# Patient Record
Sex: Female | Born: 1980 | Race: Black or African American | Hispanic: No | Marital: Single | Smoking: Never smoker
Health system: Southern US, Community
[De-identification: ages and names within clinical notes are randomized; demographics above are authoritative.]

## PROBLEM LIST (undated history)

## (undated) DIAGNOSIS — B009 Herpesviral infection, unspecified: Secondary | ICD-10-CM

## (undated) DIAGNOSIS — F79 Unspecified intellectual disabilities: Secondary | ICD-10-CM

## (undated) DIAGNOSIS — F329 Major depressive disorder, single episode, unspecified: Secondary | ICD-10-CM

## (undated) DIAGNOSIS — F209 Schizophrenia, unspecified: Secondary | ICD-10-CM

## (undated) DIAGNOSIS — F419 Anxiety disorder, unspecified: Secondary | ICD-10-CM

## (undated) DIAGNOSIS — F32A Depression, unspecified: Secondary | ICD-10-CM

## (undated) HISTORY — DX: Herpesviral infection, unspecified: B00.9

---

## 2011-08-13 ENCOUNTER — Emergency Department (HOSPITAL_COMMUNITY)
Admission: EM | Admit: 2011-08-13 | Discharge: 2011-08-14 | Disposition: A | Discharge status: Home / Self Care | Payer: Medicaid Other | Attending: Emergency Medicine | Admitting: Emergency Medicine

## 2011-08-13 DIAGNOSIS — F209 Schizophrenia, unspecified: Secondary | ICD-10-CM | POA: Insufficient documentation

## 2011-08-13 DIAGNOSIS — R45851 Suicidal ideations: Secondary | ICD-10-CM | POA: Insufficient documentation

## 2011-08-13 DIAGNOSIS — F79 Unspecified intellectual disabilities: Secondary | ICD-10-CM | POA: Insufficient documentation

## 2011-08-13 LAB — RAPID URINE DRUG SCREEN, HOSP PERFORMED
Amphetamines: NOT DETECTED
Tetrahydrocannabinol: NOT DETECTED

## 2011-08-13 LAB — CBC
Hemoglobin: 11.8 g/dL — ABNORMAL LOW (ref 12.0–15.0)
RBC: 4.63 MIL/uL (ref 3.87–5.11)
WBC: 6.5 10*3/uL (ref 4.0–10.5)

## 2011-08-13 LAB — COMPREHENSIVE METABOLIC PANEL
ALT: 20 U/L (ref 0–35)
Alkaline Phosphatase: 80 U/L (ref 39–117)
CO2: 23 mEq/L (ref 19–32)
Chloride: 109 mEq/L (ref 96–112)
GFR calc Af Amer: >60 mL/min (ref >60)
GFR calc non Af Amer: >60 mL/min (ref >60)
Glucose, Bld: 84 mg/dL (ref 70–99)
Potassium: 3.5 mEq/L (ref 3.5–5.1)
Sodium: 143 mEq/L (ref 135–145)
Total Bilirubin: 0.2 mg/dL — ABNORMAL LOW (ref 0.3–1.2)
Total Protein: 8.2 g/dL (ref 6.0–8.3)

## 2011-08-13 LAB — DIFFERENTIAL
Basophils Absolute: 0 10*3/uL (ref 0.0–0.1)
Basophils Relative: 0 % (ref 0–1)
Lymphocytes Relative: 46 % (ref 12–46)
Neutro Abs: 3 10*3/uL (ref 1.7–7.7)
Neutrophils Relative %: 46 % (ref 43–77)

## 2011-08-14 DIAGNOSIS — F259 Schizoaffective disorder, unspecified: Secondary | ICD-10-CM

## 2011-08-15 NOTE — Consult Note (Signed)
  NAMESHUNTE, SENSENEY NO.:  [id number]  MEDICAL RECORD NO.:  [id number]  LOCATION:  [facility]LED                         FACILITY:  [facility]  PHYSICIAN:  Eulogio Ditch, MD DATE OF BIRTH:  30-Aug-1981  DATE OF CONSULTATION:  08/14/2011 DATE OF DISCHARGE:                                CONSULTATION   FACILITY:  [facility].  HISTORY OF PRESENT ILLNESS:  I saw the patient and reviewed the comprehensive assessment.  The patient is a 30 year old [place] American female with history of schizoaffective disorder and mild MR.  The patient lives at group home.  The patient stated that she was upset with the peer at the group home, that is why she scratched herself.  The patient do not have any laceration or bruise on the forearm.  The patient is very logical and goal directed, easily retractable, not hallucinating or delusional, not suicidal or homicidal.  The patient wants to go back to the group home.  The patient is compliant with her medication.  No agitation reported by the ER staff.  PSYCH MEDICATIONS:  The patient is on: 1. Topiramate 25 mg p.o. daily. 2. Depakote 500 mg at bedtime. 3. Zyprexa 15 mg at bedtime.  MEDICAL ISSUES:  No active medical issues.  ALLERGIES:  No known drug allergies  MENTAL STATUS EXAM:  The patient is calm, cooperative to the interview. Fair eye contact.  No abnormal movements noticed.  Speech is normal in rate, rhythm and volume.  Mood euthymic.  Affect mood congruent. Thought process logical and goal directed.  Not hallucinating or delusional, not suicidal or homicidal.  Alert, awake, oriented x3. Memory:  Immediate, recent, remote fair.  Attention and concentration fair.  Abstraction ability fair.  Insight and judgment intact.  DIAGNOSES:  AXIS I:  As per history, schizoaffective disorder. AXIS II:  Mild mental retardation. AXIS III:  No active medical issue. AXIS IV:  Chronic mental health issues. AXIS V:   55.  RECOMMENDATIONS:  At this time, the patient can be discharged back to the group home.  She is not acutely psychotic and not having any suicidal or homicidal ideations.  Thanks for involving me in taking care of this patient.     Eulogio Ditch, MD     SA/MEDQ  D:  08/14/2011  T:  08/14/2011  Job:  [id number]  Electronically Signed by Eulogio Ditch  on 08/15/2011 01:06:48 PM

## 2011-11-01 ENCOUNTER — Encounter: Payer: Self-pay | Admitting: *Deleted

## 2011-11-01 ENCOUNTER — Emergency Department (HOSPITAL_COMMUNITY)
Admission: EM | Admit: 2011-11-01 | Discharge: 2011-11-02 | Disposition: A | Discharge status: Home / Self Care | Payer: Medicaid Other | Attending: Emergency Medicine | Admitting: Emergency Medicine

## 2011-11-01 DIAGNOSIS — F3289 Other specified depressive episodes: Secondary | ICD-10-CM | POA: Insufficient documentation

## 2011-11-01 DIAGNOSIS — F329 Major depressive disorder, single episode, unspecified: Secondary | ICD-10-CM | POA: Insufficient documentation

## 2011-11-01 DIAGNOSIS — Z8659 Personal history of other mental and behavioral disorders: Secondary | ICD-10-CM | POA: Insufficient documentation

## 2011-11-01 DIAGNOSIS — Z79899 Other long term (current) drug therapy: Secondary | ICD-10-CM | POA: Insufficient documentation

## 2011-11-01 HISTORY — DX: Depression, unspecified: F32.A

## 2011-11-01 HISTORY — DX: Major depressive disorder, single episode, unspecified: F32.9

## 2011-11-01 HISTORY — DX: Schizophrenia, unspecified: F20.9

## 2011-11-01 LAB — CBC
Hemoglobin: 12.2 g/dL (ref 12.0–15.0)
MCH: 24.8 pg — ABNORMAL LOW (ref 26.0–34.0)
MCHC: 33.5 g/dL (ref 30.0–36.0)

## 2011-11-01 LAB — URINALYSIS, ROUTINE W REFLEX MICROSCOPIC
Bilirubin Urine: NEGATIVE
Glucose, UA: NEGATIVE mg/dL
Hgb urine dipstick: NEGATIVE
Ketones, ur: NEGATIVE mg/dL
pH: 7 (ref 5.0–8.0)

## 2011-11-01 LAB — COMPREHENSIVE METABOLIC PANEL
ALT: 11 U/L (ref 0–35)
Calcium: 9.7 mg/dL (ref 8.4–10.5)
GFR calc Af Amer: >90 mL/min (ref >90)
Glucose, Bld: 91 mg/dL (ref 70–99)
Sodium: 138 mEq/L (ref 135–145)
Total Protein: 8.4 g/dL — ABNORMAL HIGH (ref 6.0–8.3)

## 2011-11-01 LAB — RAPID URINE DRUG SCREEN, HOSP PERFORMED
Benzodiazepines: NOT DETECTED
Cocaine: NOT DETECTED

## 2011-11-01 LAB — ETHANOL: Alcohol, Ethyl (B): <11 mg/dL (ref 0–11)

## 2011-11-01 MED ORDER — CLONAZEPAM 1 MG PO TABS
1.0000 mg | ORAL_TABLET | Freq: Every day | ORAL | Status: DC
  Administered 2011-11-01: 1 mg via ORAL
  Filled 2011-11-01: qty 1

## 2011-11-01 MED ORDER — TOPIRAMATE 25 MG PO TABS
100.0000 mg | ORAL_TABLET | Freq: Every day | ORAL | Status: DC
  Administered 2011-11-01 – 2011-11-02 (×2): 100 mg via ORAL
  Filled 2011-11-01 (×2): qty 4

## 2011-11-01 MED ORDER — DIVALPROEX SODIUM 500 MG PO DR TAB
500.0000 mg | DELAYED_RELEASE_TABLET | Freq: Once | ORAL | Status: AC
  Administered 2011-11-01: 500 mg via ORAL
  Filled 2011-11-01: qty 1

## 2011-11-01 MED ORDER — ACETAMINOPHEN 325 MG PO TABS: 650.0000 mg | ORAL_TABLET | ORAL | Status: DC | PRN

## 2011-11-01 MED ORDER — OLANZAPINE 5 MG PO TABS
30.0000 mg | ORAL_TABLET | Freq: Every day | ORAL | Status: DC
  Administered 2011-11-01: 30 mg via ORAL
  Filled 2011-11-01: qty 6

## 2011-11-01 MED ORDER — ONDANSETRON HCL 4 MG PO TABS: 4.0000 mg | ORAL_TABLET | Freq: Three times a day (TID) | ORAL | Status: DC | PRN

## 2011-11-01 MED ORDER — LORAZEPAM 1 MG PO TABS: 1.0000 mg | ORAL_TABLET | Freq: Three times a day (TID) | ORAL | Status: DC | PRN

## 2011-11-01 NOTE — ED Provider Notes (Signed)
History     CSN: [id number] Arrival date & time: 11/01/2011  7:52 PM   First MD Initiated Contact with Patient 11/01/11 2027      Chief Complaint  Patient presents with  . Depression  . Medical Clearance    (Consider location/radiation/quality/duration/timing/severity/associated sxs/prior treatment) The history is provided by the patient and a parent. No language interpreter was used.   Here for medical clearance and BH assessment.  States that she is depressed but not suicidal or homocidal.  Feels hopeless and feels neglected by her family.  States that she is living in a womens group home and they are "being mean" to her also.  Has a 30 year old that she misses.  She has been "cutting" herself with a sharp pencil.  Told the nurse she was using her fingernails.  No visible "cut" marks.  Superficial scratches.    Past Medical History  Diagnosis Date  . Schizophrenia     No past surgical history on file.  No family history on file.  History  Substance Use Topics  . Smoking status: Never Smoker   . Smokeless tobacco: Not on file  . Alcohol Use: No    OB History    Grav Para Term Preterm Abortions TAB SAB Ect Mult Living                  Review of Systems  All other systems reviewed and are negative.    Allergies  Review of patient's allergies indicates no known allergies.  Home Medications   Current Outpatient Rx  Name Route Sig Dispense Refill  . CLONAZEPAM 1 MG PO TABS Oral Take 1 mg by mouth at bedtime.      Marland Kitchen DIVALPROEX SODIUM 500 MG PO TB24 Oral Take 500 mg by mouth daily.      Marland Kitchen FERROUS SULFATE 325 (65 FE) MG PO TABS Oral Take 325 mg by mouth daily.      . THEREMS PO TABS Oral Take 1 tablet by mouth daily.      Marland Kitchen OLANZAPINE 15 MG PO TABS Oral Take 30 mg by mouth at bedtime.      Marland Kitchen OLANZAPINE 5 MG PO TABS Oral Take 5 mg by mouth as needed. For agitation.     Marland Kitchen POLYETHYLENE GLYCOL 3350 PO PACK Oral Take 17 g by mouth daily as needed. For constipation.    Bernadette Hoit SODIUM 8.6-50 MG PO TABS Oral Take 2 tablets by mouth daily.      . TOPIRAMATE 100 MG PO TABS Oral Take 100 mg by mouth at bedtime.        BP 117/79  Pulse 72  Temp(Src) 98.6 F (37 C) (Oral)  Resp 16  SpO2 100%  Physical Exam  Constitutional: She is oriented to person, place, and time. She appears well-developed and well-nourished. No distress.  HENT:  Head: Normocephalic.  Eyes: Pupils are equal, round, and reactive to light.  Neck: Normal range of motion. Neck supple.  Cardiovascular: Normal rate.   Pulmonary/Chest: Effort normal and breath sounds normal.  Abdominal: Soft.  Musculoskeletal: Normal range of motion.  Neurological: She is alert and oriented to person, place, and time.  Skin: Skin is warm and dry. She is not diaphoretic.  Psychiatric: She has a normal mood and affect.    ED Course  Procedures (including critical care time)  Labs Reviewed  CBC - Abnormal; Notable for the following:    MCV 74.1 (*)    MCH 24.8 (*)  All other components within normal limits  COMPREHENSIVE METABOLIC PANEL - Abnormal; Notable for the following:    BUN 5 (*)    Total Protein 8.4 (*)    Total Bilirubin 0.1 (*)    All other components within normal limits  ETHANOL  URINE RAPID DRUG SCREEN (HOSP PERFORMED)  URINALYSIS, ROUTINE W REFLEX MICROSCOPIC  POCT PREGNANCY, URINE  POCT PREGNANCY, URINE   No results found. Medical screening examination/treatment/procedure(s) were performed by non-physician practitioner and as supervising physician I was immediately available for consultation/collaboration. Osvaldo Human, M.D.    No diagnosis found.    MDM  For medical clearance and BH assessment tonight for depression.  States she is not suicidal or homocidal.  Just feels hopeless.  In a group home with women who are being mean to her.  Family is neglecting her.  Misses her 15 year old daughter.  Taking her meds as ordered.  Going to a "day care"  On a daily  basis.  Was here 1 month ago.  States she is "cutting" herself.  Superficial scratch marks on L forearm.         Jethro Bastos, NP 11/01/11 2128 1am  Report given to Dr. Hyacinth Meeker.  Patient here for depression cutting self.  Will see Dr. Debby Freiberg in the am early.  Probably needs a social work consult for a different housing placement.    Jethro Bastos, NP 11/02/11 0114  Jethro Bastos, NP 11/02/11 0149  Carleene Cooper III, MD 11/02/11 [phone]

## 2011-11-01 NOTE — ED Notes (Signed)
Per EMS pt is coming from group home, is depressed, has not had meds today, takes them at night. Pt states wants to hurt herself by scratching her R arm with her L fingernails.  No marks noted.
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
## 2011-11-02 ENCOUNTER — Encounter (HOSPITAL_COMMUNITY): Payer: Self-pay | Admitting: *Deleted

## 2011-11-02 DIAGNOSIS — F329 Major depressive disorder, single episode, unspecified: Secondary | ICD-10-CM
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
## 2011-11-02 NOTE — ED Notes (Signed)
Patient up at desk asking for something to eat. Provided patient with pop tarts and soda.
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
## 2011-11-02 NOTE — Progress Notes (Addendum)
Patient was seen by psychiatrist who has recommended discharge. This writer called group home [phone] and left a message requesting return phone call to arrange for discharge pickup.  Ileene Hutchinson , MSW, LCSWA 11/02/2011 10:06 AM  This writer contacted patient's group home and they advised they are unable to pick her up until 6pm. CSW contacted supervisor who has approved cab voucher. Patient to be transported to group home via [facility] cab. EDP and patient's nurse notified.  Ileene Hutchinson , MSW, LCSWA 11/02/2011 2:21 PM
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
## 2012-01-22 ENCOUNTER — Encounter (HOSPITAL_COMMUNITY): Payer: Self-pay

## 2012-01-22 ENCOUNTER — Emergency Department (HOSPITAL_COMMUNITY)
Admission: EM | Admit: 2012-01-22 | Discharge: 2012-01-23 | Disposition: A | Discharge status: Home / Self Care | Payer: Medicaid Other | Attending: Emergency Medicine | Admitting: Emergency Medicine

## 2012-01-22 DIAGNOSIS — Z79899 Other long term (current) drug therapy: Secondary | ICD-10-CM | POA: Insufficient documentation

## 2012-01-22 DIAGNOSIS — F209 Schizophrenia, unspecified: Secondary | ICD-10-CM

## 2012-01-22 DIAGNOSIS — F411 Generalized anxiety disorder: Secondary | ICD-10-CM | POA: Insufficient documentation

## 2012-01-22 DIAGNOSIS — F419 Anxiety disorder, unspecified: Secondary | ICD-10-CM

## 2012-01-22 LAB — URINALYSIS, ROUTINE W REFLEX MICROSCOPIC
Ketones, ur: NEGATIVE mg/dL
Leukocytes, UA: NEGATIVE
Nitrite: NEGATIVE
Protein, ur: NEGATIVE mg/dL
Urobilinogen, UA: 0.2 mg/dL (ref 0.0–1.0)

## 2012-01-22 LAB — CBC
MCHC: 33.9 g/dL (ref 30.0–36.0)
Platelets: 206 10*3/uL (ref 150–400)
RDW: 15.3 % (ref 11.5–15.5)
WBC: 8.1 10*3/uL (ref 4.0–10.5)

## 2012-01-22 LAB — RAPID URINE DRUG SCREEN, HOSP PERFORMED: Amphetamines: NOT DETECTED

## 2012-01-22 LAB — BASIC METABOLIC PANEL
CO2: 22 mEq/L (ref 19–32)
Chloride: 107 mEq/L (ref 96–112)
GFR calc non Af Amer: >90 mL/min (ref >90)
Glucose, Bld: 78 mg/dL (ref 70–99)
Potassium: 3.4 mEq/L — ABNORMAL LOW (ref 3.5–5.1)
Sodium: 140 mEq/L (ref 135–145)

## 2012-01-22 LAB — ETHANOL: Alcohol, Ethyl (B): <11 mg/dL (ref 0–11)

## 2012-01-22 MED ORDER — CLONAZEPAM 1 MG PO TABS: 1.0000 mg | ORAL_TABLET | Freq: Every day | ORAL | Status: DC

## 2012-01-22 MED ORDER — OLANZAPINE 5 MG PO TABS: 30.0000 mg | ORAL_TABLET | Freq: Every day | ORAL | Status: DC

## 2012-01-22 MED ORDER — IBUPROFEN 600 MG PO TABS: 600.0000 mg | ORAL_TABLET | Freq: Three times a day (TID) | ORAL | Status: DC | PRN

## 2012-01-22 MED ORDER — ZIPRASIDONE HCL 20 MG PO CAPS: 160.0000 mg | ORAL_CAPSULE | Freq: Every day | ORAL | Status: DC

## 2012-01-22 MED ORDER — FERROUS SULFATE 325 (65 FE) MG PO TABS
325.0000 mg | ORAL_TABLET | Freq: Every day | ORAL | Status: DC
  Administered 2012-01-23: 325 mg via ORAL
  Filled 2012-01-22: qty 1

## 2012-01-22 MED ORDER — ALUM & MAG HYDROXIDE-SIMETH 200-200-20 MG/5ML PO SUSP: 30.0000 mL | ORAL | Status: DC | PRN

## 2012-01-22 MED ORDER — TOPIRAMATE 25 MG PO TABS: 100.0000 mg | ORAL_TABLET | Freq: Every day | ORAL | Status: DC

## 2012-01-22 MED ORDER — ONDANSETRON HCL 4 MG PO TABS: 4.0000 mg | ORAL_TABLET | Freq: Three times a day (TID) | ORAL | Status: DC | PRN

## 2012-01-22 MED ORDER — LORAZEPAM 1 MG PO TABS: 1.0000 mg | ORAL_TABLET | Freq: Three times a day (TID) | ORAL | Status: DC | PRN

## 2012-01-22 MED ORDER — NICOTINE 21 MG/24HR TD PT24: 21.0000 mg | MEDICATED_PATCH | Freq: Every day | TRANSDERMAL | Status: DC

## 2012-01-22 MED ORDER — ADULT MULTIVITAMIN W/MINERALS CH: 1.0000 | ORAL_TABLET | Freq: Every day | ORAL | Status: DC

## 2012-01-22 MED ORDER — ACETAMINOPHEN 325 MG PO TABS: 650.0000 mg | ORAL_TABLET | ORAL | Status: DC | PRN

## 2012-01-22 MED ORDER — DIVALPROEX SODIUM ER 500 MG PO TB24: 500.0000 mg | ORAL_TABLET | Freq: Every day | ORAL | Status: DC

## 2012-01-22 MED ORDER — SENNOSIDES-DOCUSATE SODIUM 8.6-50 MG PO TABS: 2.0000 | ORAL_TABLET | Freq: Every day | ORAL | Status: DC

## 2012-01-22 MED ORDER — ZOLPIDEM TARTRATE 5 MG PO TABS: 5.0000 mg | ORAL_TABLET | Freq: Every evening | ORAL | Status: DC | PRN

## 2012-01-22 NOTE — ED Notes (Signed)
Telepsych report faxed to MD.  Awaiting consult.

## 2012-01-22 NOTE — ED Notes (Signed)
Patient arrived by ems after having panic attack and increased anxiety at halfway house this afternoon. Patient denies suicidal thoughts but reports that she just cannot get her mind right.
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
## 2012-01-23 MED ORDER — TOPIRAMATE 25 MG PO TABS
100.0000 mg | ORAL_TABLET | Freq: Every day | ORAL | Status: DC
  Administered 2012-01-23: 100 mg via ORAL
  Filled 2012-01-23: qty 4

## 2012-01-23 MED ORDER — OLANZAPINE 5 MG PO TABS
30.0000 mg | ORAL_TABLET | Freq: Every day | ORAL | Status: DC
  Administered 2012-01-23: 30 mg via ORAL
  Filled 2012-01-23: qty 6

## 2012-01-23 MED ORDER — ZIPRASIDONE HCL 20 MG PO CAPS
160.0000 mg | ORAL_CAPSULE | Freq: Every day | ORAL | Status: DC
  Administered 2012-01-23: 160 mg via ORAL
  Filled 2012-01-23: qty 8

## 2012-01-23 MED ORDER — CLONAZEPAM 1 MG PO TABS
1.0000 mg | ORAL_TABLET | Freq: Every day | ORAL | Status: DC
  Administered 2012-01-23: 1 mg via ORAL
  Filled 2012-01-23: qty 1

## 2012-01-23 MED ORDER — DIVALPROEX SODIUM ER 500 MG PO TB24
500.0000 mg | ORAL_TABLET | Freq: Every day | ORAL | Status: DC
  Administered 2012-01-23: 500 mg via ORAL
  Filled 2012-01-23 (×3): qty 1

## 2012-01-23 NOTE — ED Notes (Signed)
Pt not able to d/c to group home tonight.  Pt needs transport.  Arlene at group home states that she has other residents and unable to leave to get patient.  Pt with no other family in area.  D/T pt having MR, decided with Dr. Preston Fleeting, keep patient overnight, give regular meds and d/c in am.  Will hold pt till am.

## 2012-08-09 ENCOUNTER — Other Ambulatory Visit: Payer: Self-pay | Admitting: Obstetrics and Gynecology

## 2012-08-09 ENCOUNTER — Other Ambulatory Visit (HOSPITAL_COMMUNITY)
Admission: RE | Admit: 2012-08-09 | Discharge: 2012-08-09 | Disposition: A | Discharge status: Home / Self Care | Payer: Medicaid Other | Source: Ambulatory Visit | Attending: Obstetrics and Gynecology | Admitting: Obstetrics and Gynecology

## 2012-08-09 DIAGNOSIS — Z1151 Encounter for screening for human papillomavirus (HPV): Secondary | ICD-10-CM | POA: Insufficient documentation

## 2012-08-09 DIAGNOSIS — Z124 Encounter for screening for malignant neoplasm of cervix: Secondary | ICD-10-CM | POA: Insufficient documentation

## 2012-08-09 DIAGNOSIS — Z113 Encounter for screening for infections with a predominantly sexual mode of transmission: Secondary | ICD-10-CM | POA: Insufficient documentation

## 2012-08-11 ENCOUNTER — Encounter (HOSPITAL_COMMUNITY): Payer: Self-pay | Admitting: Emergency Medicine

## 2012-08-11 ENCOUNTER — Emergency Department (HOSPITAL_COMMUNITY)
Admission: EM | Admit: 2012-08-11 | Discharge: 2012-08-12 | Disposition: A | Discharge status: Home / Self Care | Payer: Medicaid Other | Attending: Emergency Medicine | Admitting: Emergency Medicine

## 2012-08-11 DIAGNOSIS — F79 Unspecified intellectual disabilities: Secondary | ICD-10-CM | POA: Insufficient documentation

## 2012-08-11 DIAGNOSIS — Z79899 Other long term (current) drug therapy: Secondary | ICD-10-CM | POA: Insufficient documentation

## 2012-08-11 DIAGNOSIS — Z8659 Personal history of other mental and behavioral disorders: Secondary | ICD-10-CM | POA: Insufficient documentation

## 2012-08-11 DIAGNOSIS — R079 Chest pain, unspecified: Secondary | ICD-10-CM

## 2012-08-11 HISTORY — DX: Unspecified intellectual disabilities: F79

## 2012-08-11 MED ORDER — LORAZEPAM 1 MG PO TABS
1.0000 mg | ORAL_TABLET | Freq: Once | ORAL | Status: AC
  Administered 2012-08-12: 1 mg via ORAL
  Filled 2012-08-11: qty 1

## 2012-08-11 MED ORDER — IBUPROFEN 800 MG PO TABS
800.0000 mg | ORAL_TABLET | Freq: Once | ORAL | Status: AC
  Administered 2012-08-12: 800 mg via ORAL
  Filled 2012-08-11: qty 1
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
## 2012-08-11 NOTE — ED Notes (Signed)
Patient placed on cardiac monitor.

## 2012-08-11 NOTE — ED Provider Notes (Signed)
History     CSN: [id number]  Arrival date & time 08/11/12  2217   First MD Initiated Contact with Patient 08/11/12 2341      Chief Complaint  Patient presents with  . Chest Pain    (Consider location/radiation/quality/duration/timing/severity/associated sxs/prior treatment) HPI HX per PT. Stabbing intermittent CP, on and off since middle of last month.  When it gets bad, she gets associated tingling in both of her hands. No SOB. No N/V/D. No Diaphoresis. No syncope. Pain gest worse when "I get paranoid".  Is currently taking all medications as prescribed. No cough. No F/C. Lives in a group home. Tonight. Feels stressed out about a court date coming up. Pain returning, on and off but not present now.  Past Medical History  Diagnosis Date  . Schizophrenia   . Depression   . Mental retardation     History reviewed. No pertinent past surgical history.  No family history on file.  History  Substance Use Topics  . Smoking status: Never Smoker   . Smokeless tobacco: Not on file  . Alcohol Use: No    OB History    Grav Para Term Preterm Abortions TAB SAB Ect Mult Living                  Review of Systems  Constitutional: Negative for fever and chills.  HENT: Negative for neck pain and neck stiffness.   Eyes: Negative for pain.  Respiratory: Negative for shortness of breath.   Cardiovascular: Positive for chest pain.  Gastrointestinal: Negative for abdominal pain.  Genitourinary: Negative for dysuria.  Musculoskeletal: Negative for back pain.  Skin: Negative for rash.  Neurological: Negative for headaches.  All other systems reviewed and are negative.    Allergies  Review of patient's allergies indicates no known allergies.  Home Medications   Current Outpatient Rx  Name Route Sig Dispense Refill  . ACETAMINOPHEN 500 MG PO TABS Oral Take 500 mg by mouth every 6 (six) hours as needed. For tooth pain relief    . CLONAZEPAM 1 MG PO TABS Oral Take 1 mg by mouth at  bedtime.      Marland Kitchen DIVALPROEX SODIUM ER 500 MG PO TB24 Oral Take 500 mg by mouth daily.      Marland Kitchen FERROUS SULFATE 325 (65 FE) MG PO TABS Oral Take 325 mg by mouth daily.      . ADULT MULTIVITAMIN W/MINERALS CH Oral Take 1 tablet by mouth daily.    Bernadette Hoit SODIUM 8.6-50 MG PO TABS Oral Take 2 tablets by mouth daily.      . TOPIRAMATE 100 MG PO TABS Oral Take 100 mg by mouth at bedtime.        BP 97/68  Pulse 62  Temp 98.2 F (36.8 C) (Oral)  Resp 17  SpO2 100%  Physical Exam  Constitutional: She is oriented to person, place, and time. She appears well-developed and well-nourished.  HENT:  Head: Normocephalic and atraumatic.  Eyes: Conjunctivae and EOM are normal. Pupils are equal, round, and reactive to light.  Neck: Trachea normal. Neck supple. No thyromegaly present.  Cardiovascular: Normal rate, regular rhythm, S1 normal, S2 normal and normal pulses.     No systolic murmur is present   No diastolic murmur is present  Pulses:      Radial pulses are 2+ on the right side, and 2+ on the left side.  Pulmonary/Chest: Effort normal and breath sounds normal. She has no wheezes. She has no rhonchi. She  has no rales. She exhibits no tenderness.  Abdominal: Soft. Normal appearance and bowel sounds are normal. There is no tenderness. There is no CVA tenderness and negative Murphy's sign.  Musculoskeletal:       BLE:s Calves nontender, no cords or erythema, negative Homans sign  Neurological: She is alert and oriented to person, place, and time. She has normal strength. No cranial nerve deficit or sensory deficit. GCS eye subscore is 4. GCS verbal subscore is 5. GCS motor subscore is 6.  Skin: Skin is warm and dry. No rash noted. She is not diaphoretic.  Psychiatric: Her speech is normal.       Anxious, otherwise Cooperative and appropriate    ED Course  Procedures (including critical care time)  Results for orders placed during the hospital encounter of 01/22/12  CBC       Component Value Range   WBC 8.1  4.0 - 10.5 K/uL   RBC 4.84  3.87 - 5.11 MIL/uL   Hemoglobin 12.0  12.0 - 15.0 g/dL   HCT 40.9 (*) 81.1 - 91.4 %   MCV 73.1 (*) 78.0 - 100.0 fL   MCH 24.8 (*) 26.0 - 34.0 pg   MCHC 33.9  30.0 - 36.0 g/dL   RDW 78.2  95.6 - 21.3 %   Platelets 206  150 - 400 K/uL  ETHANOL      Component Value Range   Alcohol, Ethyl (B) <11  0 - 11 mg/dL  URINALYSIS, ROUTINE W REFLEX MICROSCOPIC      Component Value Range   Color, Urine YELLOW  YELLOW   APPearance CLEAR  CLEAR   Specific Gravity, Urine 1.004 (*) 1.005 - 1.030   pH 7.0  5.0 - 8.0   Glucose, UA NEGATIVE  NEGATIVE mg/dL   Hgb urine dipstick NEGATIVE  NEGATIVE   Bilirubin Urine NEGATIVE  NEGATIVE   Ketones, ur NEGATIVE  NEGATIVE mg/dL   Protein, ur NEGATIVE  NEGATIVE mg/dL   Urobilinogen, UA 0.2  0.0 - 1.0 mg/dL   Nitrite NEGATIVE  NEGATIVE   Leukocytes, UA NEGATIVE  NEGATIVE  URINE RAPID DRUG SCREEN (HOSP PERFORMED)      Component Value Range   Opiates NONE DETECTED  NONE DETECTED   Cocaine NONE DETECTED  NONE DETECTED   Benzodiazepines NONE DETECTED  NONE DETECTED   Amphetamines NONE DETECTED  NONE DETECTED   Tetrahydrocannabinol NONE DETECTED  NONE DETECTED   Barbiturates NONE DETECTED  NONE DETECTED  BASIC METABOLIC PANEL      Component Value Range   Sodium 140  135 - 145 mEq/L   Potassium 3.4 (*) 3.5 - 5.1 mEq/L   Chloride 107  96 - 112 mEq/L   CO2 22  19 - 32 mEq/L   Glucose, Bld 78  70 - 99 mg/dL   BUN 3 (*) 6 - 23 mg/dL   Creatinine, Ser 0.86  0.50 - 1.10 mg/dL   Calcium 9.7  8.4 - 57.8 mg/dL   GFR calc non Af Amer >90  >90 mL/min   GFR calc Af Amer >90  >90 mL/min  POCT PREGNANCY, URINE      Component Value Range   Preg Test, Ur NEGATIVE  NEGATIVE   Dg Chest 2 View  08/12/2012  *RADIOLOGY REPORT*  Clinical Data: Chest pain.  CHEST - 2 VIEW  Comparison:  None.  Findings:  The heart size and mediastinal contours are within normal limits.  Both lungs are clear.  The visualized skeletal  structures are unremarkable.  IMPRESSION: No active cardiopulmonary disease.   Original Report Authenticated By: Danae Orleans, M.D.     Date: 08/12/2012  Rate: 60  Rhythm: normal sinus rhythm  QRS Axis: normal  Intervals: normal  ST/T Wave abnormalities: nonspecific ST changes  Conduction Disutrbances:none  Narrative Interpretation:   Old EKG Reviewed: none available  Motrin and ativan provided.   Recheck: remains pain free and is feeling less anxious. Stable for d/c home and outpatient follow up for further evaluation. Plan continue home medications and RX motrin provided. She has xanax at home scheduled QHS.  MDM  VS and nursing notes reviewed. Medications provided. ECG and CXR reviewed.   Previous records / previous blood work, UDS reviewed as above.        Sunnie Nielsen, MD 08/12/12 [phone]

## 2012-08-12 ENCOUNTER — Emergency Department (HOSPITAL_COMMUNITY): Payer: Medicaid Other

## 2012-08-12 MED ORDER — IBUPROFEN 800 MG PO TABS: 800.0000 mg | ORAL_TABLET | Freq: Three times a day (TID) | ORAL | Status: AC

## 2012-10-20 ENCOUNTER — Emergency Department (HOSPITAL_COMMUNITY)
Admission: EM | Admit: 2012-10-20 | Discharge: 2012-10-21 | Disposition: A | Discharge status: Home / Self Care | Payer: 59 | Attending: Emergency Medicine | Admitting: Emergency Medicine

## 2012-10-20 ENCOUNTER — Encounter (HOSPITAL_COMMUNITY): Payer: Self-pay | Admitting: *Deleted

## 2012-10-20 DIAGNOSIS — F79 Unspecified intellectual disabilities: Secondary | ICD-10-CM | POA: Insufficient documentation

## 2012-10-20 DIAGNOSIS — R454 Irritability and anger: Secondary | ICD-10-CM

## 2012-10-20 DIAGNOSIS — F911 Conduct disorder, childhood-onset type: Secondary | ICD-10-CM | POA: Insufficient documentation

## 2012-10-20 DIAGNOSIS — F209 Schizophrenia, unspecified: Secondary | ICD-10-CM | POA: Insufficient documentation

## 2012-10-20 DIAGNOSIS — F329 Major depressive disorder, single episode, unspecified: Secondary | ICD-10-CM | POA: Insufficient documentation

## 2012-10-20 DIAGNOSIS — Z79899 Other long term (current) drug therapy: Secondary | ICD-10-CM | POA: Insufficient documentation

## 2012-10-20 DIAGNOSIS — F3289 Other specified depressive episodes: Secondary | ICD-10-CM | POA: Insufficient documentation

## 2012-10-20 LAB — RAPID URINE DRUG SCREEN, HOSP PERFORMED
Amphetamines: NOT DETECTED
Barbiturates: NOT DETECTED
Benzodiazepines: NOT DETECTED
Cocaine: NOT DETECTED
Opiates: NOT DETECTED
Tetrahydrocannabinol: NOT DETECTED
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
## 2012-10-21 NOTE — ED Notes (Signed)
Report given to [facility]. Patient being discharged.

## 2012-10-21 NOTE — ED Notes (Signed)
Spoke to [employer] at [facility] in reference to discharge instructions and transportation. Arlene is going to call back with information re: transportation home.

## 2012-10-21 NOTE — ED Notes (Signed)
[facility] called for transfer 

## 2013-03-08 ENCOUNTER — Encounter (HOSPITAL_COMMUNITY): Payer: Self-pay | Admitting: Emergency Medicine

## 2013-03-08 ENCOUNTER — Emergency Department (HOSPITAL_COMMUNITY)
Admission: EM | Admit: 2013-03-08 | Discharge: 2013-03-11 | Disposition: A | Discharge status: Home / Self Care | Payer: Medicaid Other | Attending: Emergency Medicine | Admitting: Emergency Medicine

## 2013-03-08 DIAGNOSIS — F209 Schizophrenia, unspecified: Secondary | ICD-10-CM | POA: Insufficient documentation

## 2013-03-08 DIAGNOSIS — G479 Sleep disorder, unspecified: Secondary | ICD-10-CM | POA: Insufficient documentation

## 2013-03-08 DIAGNOSIS — Z79899 Other long term (current) drug therapy: Secondary | ICD-10-CM | POA: Insufficient documentation

## 2013-03-08 DIAGNOSIS — Z3202 Encounter for pregnancy test, result negative: Secondary | ICD-10-CM | POA: Insufficient documentation

## 2013-03-08 DIAGNOSIS — R45851 Suicidal ideations: Secondary | ICD-10-CM

## 2013-03-08 DIAGNOSIS — F3289 Other specified depressive episodes: Secondary | ICD-10-CM | POA: Insufficient documentation

## 2013-03-08 DIAGNOSIS — X838XXA Intentional self-harm by other specified means, initial encounter: Secondary | ICD-10-CM | POA: Insufficient documentation

## 2013-03-08 DIAGNOSIS — F329 Major depressive disorder, single episode, unspecified: Secondary | ICD-10-CM | POA: Insufficient documentation

## 2013-03-08 DIAGNOSIS — IMO0002 Reserved for concepts with insufficient information to code with codable children: Secondary | ICD-10-CM | POA: Insufficient documentation

## 2013-03-08 DIAGNOSIS — F79 Unspecified intellectual disabilities: Secondary | ICD-10-CM | POA: Insufficient documentation

## 2013-03-08 LAB — COMPREHENSIVE METABOLIC PANEL
ALT: 15 U/L (ref 0–35)
AST: 26 U/L (ref 0–37)
Albumin: 3.8 g/dL (ref 3.5–5.2)
CO2: 22 mEq/L (ref 19–32)
Calcium: 8.9 mg/dL (ref 8.4–10.5)
Creatinine, Ser: 0.6 mg/dL (ref 0.50–1.10)
GFR calc non Af Amer: >90 mL/min (ref >90)
Sodium: 132 mEq/L — ABNORMAL LOW (ref 135–145)
Total Protein: 7.3 g/dL (ref 6.0–8.3)

## 2013-03-08 LAB — RAPID URINE DRUG SCREEN, HOSP PERFORMED
Benzodiazepines: NOT DETECTED
Cocaine: NOT DETECTED
Opiates: NOT DETECTED
Tetrahydrocannabinol: NOT DETECTED

## 2013-03-08 LAB — CBC
MCH: 26.1 pg (ref 26.0–34.0)
MCHC: 33.8 g/dL (ref 30.0–36.0)
MCV: 77.3 fL — ABNORMAL LOW (ref 78.0–100.0)
Platelets: 127 10*3/uL — ABNORMAL LOW (ref 150–400)
RDW: 14.2 % (ref 11.5–15.5)

## 2013-03-08 MED ORDER — ACETAMINOPHEN 325 MG PO TABS
650.0000 mg | ORAL_TABLET | ORAL | Status: DC | PRN
  Administered 2013-03-10 (×2): 650 mg via ORAL
  Filled 2013-03-08 (×2): qty 2

## 2013-03-08 MED ORDER — VALACYCLOVIR HCL 500 MG PO TABS
500.0000 mg | ORAL_TABLET | Freq: Two times a day (BID) | ORAL | Status: DC
  Administered 2013-03-08 – 2013-03-11 (×6): 500 mg via ORAL
  Filled 2013-03-08 (×7): qty 1

## 2013-03-08 MED ORDER — FERROUS SULFATE 325 (65 FE) MG PO TABS
325.0000 mg | ORAL_TABLET | Freq: Every day | ORAL | Status: DC
  Administered 2013-03-09 – 2013-03-11 (×3): 325 mg via ORAL
  Filled 2013-03-08 (×3): qty 1

## 2013-03-08 MED ORDER — LORAZEPAM 1 MG PO TABS: 1.0000 mg | ORAL_TABLET | Freq: Three times a day (TID) | ORAL | Status: DC | PRN

## 2013-03-08 MED ORDER — IBUPROFEN 600 MG PO TABS: 600.0000 mg | ORAL_TABLET | Freq: Three times a day (TID) | ORAL | Status: DC | PRN

## 2013-03-08 MED ORDER — CLONAZEPAM 1 MG PO TABS
1.0000 mg | ORAL_TABLET | Freq: Every day | ORAL | Status: DC
  Administered 2013-03-08 – 2013-03-10 (×3): 1 mg via ORAL
  Filled 2013-03-08: qty 2
  Filled 2013-03-08 (×2): qty 1

## 2013-03-08 MED ORDER — TOPIRAMATE 25 MG PO TABS
200.0000 mg | ORAL_TABLET | Freq: Every day | ORAL | Status: DC
  Administered 2013-03-10: 200 mg via ORAL
  Filled 2013-03-08: qty 8

## 2013-03-08 MED ORDER — DIVALPROEX SODIUM ER 500 MG PO TB24
1000.0000 mg | ORAL_TABLET | Freq: Every day | ORAL | Status: DC
  Administered 2013-03-08 – 2013-03-10 (×3): 1000 mg via ORAL
  Filled 2013-03-08 (×5): qty 2

## 2013-03-08 MED ORDER — SENNOSIDES-DOCUSATE SODIUM 8.6-50 MG PO TABS
2.0000 | ORAL_TABLET | Freq: Every day | ORAL | Status: DC
  Administered 2013-03-09 – 2013-03-11 (×3): 2 via ORAL
  Filled 2013-03-08 (×3): qty 2

## 2013-03-08 MED ORDER — ONDANSETRON HCL 4 MG PO TABS: 4.0000 mg | ORAL_TABLET | Freq: Three times a day (TID) | ORAL | Status: DC | PRN

## 2013-03-08 MED ORDER — CITALOPRAM HYDROBROMIDE 20 MG PO TABS
20.0000 mg | ORAL_TABLET | Freq: Every day | ORAL | Status: DC
  Administered 2013-03-09 – 2013-03-11 (×3): 20 mg via ORAL
  Filled 2013-03-08 (×3): qty 1

## 2013-03-08 MED ORDER — ALUM & MAG HYDROXIDE-SIMETH 200-200-20 MG/5ML PO SUSP
30.0000 mL | ORAL | Status: DC | PRN
  Administered 2013-03-10: 30 mL via ORAL
  Filled 2013-03-08: qty 30

## 2013-03-08 MED ORDER — POTASSIUM CHLORIDE CRYS ER 20 MEQ PO TBCR
20.0000 meq | EXTENDED_RELEASE_TABLET | Freq: Once | ORAL | Status: AC
  Administered 2013-03-08: 20 meq via ORAL
  Filled 2013-03-08: qty 1

## 2013-03-08 MED ORDER — OLANZAPINE 5 MG PO TABS
20.0000 mg | ORAL_TABLET | Freq: Every day | ORAL | Status: DC
  Administered 2013-03-08 – 2013-03-10 (×2): 20 mg via ORAL
  Filled 2013-03-08: qty 4
  Filled 2013-03-08: qty 1

## 2013-03-08 MED ORDER — NICOTINE 21 MG/24HR TD PT24: 21.0000 mg | MEDICATED_PATCH | Freq: Every day | TRANSDERMAL | Status: DC
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
## 2013-03-08 NOTE — ED Provider Notes (Signed)
History    This chart was scribed for non-physician practitioner working with Vida Roller, MD by Leone Payor, ED Scribe. This patient was seen in room [facility]1 and the patient's care was started at 1737.   CSN: [id number]  Arrival date & time 03/08/13  1737   First MD Initiated Contact with Patient 03/08/13 1749      Chief Complaint  Patient presents with  . Medical Clearance     The history is provided by the patient. No language interpreter was used.   Krista [name] is a 32 y.o. female brought in by ambulance, who presents to the Emergency Department requesting medical clearance today. Pt is from a group home and got upset at a caregiver for bringing up past events. Pt used a tree branch to scratch bilateral forearms. Pt states she has been around a lot of "triggers" lately. Pt states the caregivers at the group home bring up past events which upset her. Pt states she told the caregiver her IUD was giving her problems and when she got upset with him she states she wanted to kill herself. She went to the kitchen to get a knife but was blocked from the kitchen so she went outside and cut herself with a tree branch. Pt states she takes medications at home. Pt states she saw her guardian today but lives in [place].   Pt denies smoking and alcohol use.  Past Medical History  Diagnosis Date  . Schizophrenia   . Depression   . Mental retardation     History reviewed. No pertinent past surgical history.  History reviewed. No pertinent family history.  History  Substance Use Topics  . Smoking status: Never Smoker   . Smokeless tobacco: Not on file  . Alcohol Use: No    OB History   Grav Para Term Preterm Abortions TAB SAB Ect Mult Living                  Review of Systems  Constitutional: Negative for fever.  HENT: Negative for sore throat and rhinorrhea.   Eyes: Negative for redness.  Respiratory: Negative for cough.   Cardiovascular: Negative for chest pain.   Gastrointestinal: Negative for nausea, vomiting, abdominal pain and diarrhea.  Genitourinary: Negative for dysuria.  Musculoskeletal: Negative for myalgias.  Skin: Negative for rash.  Neurological: Negative for headaches.  Psychiatric/Behavioral: Positive for suicidal ideas, sleep disturbance and self-injury.    Allergies  Review of patient's allergies indicates no known allergies.  Home Medications   Current Outpatient Rx  Name  Route  Sig  Dispense  Refill  . clonazePAM (KLONOPIN) 1 MG tablet   Oral   Take 1 mg by mouth at bedtime.           . divalproex (DEPAKOTE ER) 500 MG 24 hr tablet   Oral   Take 1,000 mg by mouth daily.          . ferrous sulfate 325 (65 FE) MG tablet   Oral   Take 325 mg by mouth daily.           . Multiple Vitamin (MULITIVITAMIN WITH MINERALS) TABS   Oral   Take 1 tablet by mouth daily.         Marland Kitchen senna-docusate (SENOKOT-S) 8.6-50 MG per tablet   Oral   Take 2 tablets by mouth daily.           Marland Kitchen topiramate (TOPAMAX) 100 MG tablet   Oral   Take 100 mg  by mouth at bedtime.           . valACYclovir (VALTREX) 500 MG tablet   Oral   Take 500 mg by mouth 2 (two) times daily.           BP 110/73  Pulse 59  Temp(Src) 98.2 F (36.8 C) (Oral)  Resp 18  SpO2 100%  Physical Exam  Nursing note and vitals reviewed. Constitutional: She appears well-developed and well-nourished. No distress.  HENT:  Head: Normocephalic and atraumatic.  Eyes: Conjunctivae and EOM are normal. Right eye exhibits no discharge. Left eye exhibits no discharge.  Neck: Normal range of motion. Neck supple. No tracheal deviation present.  Cardiovascular: Normal rate, regular rhythm and normal heart sounds.   Pulmonary/Chest: Effort normal and breath sounds normal. No respiratory distress.  Abdominal: Soft. There is no tenderness.  Musculoskeletal: Normal range of motion.  Neurological: She is alert.  Skin: Skin is warm and dry.  Multiple superficial  abrasions of the upper extremities.  Superficial abrasions to the abdomen.     Psychiatric: She exhibits a depressed mood.  Tearful affect.     ED Course  Procedures (including critical care time)  DIAGNOSTIC STUDIES: Oxygen Saturation is 97% on room air, adequate by my interpretation.    COORDINATION OF CARE: 6:36 PM Discussed treatment plan with pt at bedside and pt agreed to plan.    Labs Reviewed  CBC - Abnormal; Notable for the following:    Hemoglobin 11.4 (*)    HCT 33.7 (*)    MCV 77.3 (*)    Platelets 127 (*)    All other components within normal limits  COMPREHENSIVE METABOLIC PANEL - Abnormal; Notable for the following:    Sodium 132 (*)    Potassium 3.3 (*)    BUN 5 (*)    All other components within normal limits  ETHANOL  URINE RAPID DRUG SCREEN (HOSP PERFORMED)  VALPROIC ACID LEVEL  POCT PREGNANCY, URINE   No results found.   1. Suicidal ideations     Patient seen and examined. Work-up initiated. Holding orders complete.    Vital signs reviewed and are as follows: Filed Vitals:   03/08/13 1953  BP: 110/73  Pulse: 59  Temp: 98.2 F (36.8 C)  Resp: 18   9:21 PM Spoke with ACT, pending ACT eval.   Pending depakote level, but otherwise medically cleared. Anemia noted. No current bleeding.     MDM  Pending ACT eval. Pt currently voluntary.       I personally performed the services described in this documentation, which was scribed in my presence. The recorded information has been reviewed and is accurate.   Renne Crigler, PA-C 03/08/13 2122

## 2013-03-08 NOTE — ED Provider Notes (Signed)
Medical screening examination/treatment/procedure(s) were performed by non-physician practitioner and as supervising physician I was immediately available for consultation/collaboration.    Asharia Lotter D Latavious Bitter, MD 03/08/13 2327 

## 2013-03-09 LAB — VALPROIC ACID LEVEL: Valproic Acid Lvl: 66.5 ug/mL (ref 50.0–100.0)
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
## 2013-03-09 NOTE — ED Notes (Signed)
In the bathroom

## 2013-03-09 NOTE — ED Notes (Signed)
Sleeping, resp even and unlabored

## 2013-03-09 NOTE — ED Provider Notes (Signed)
Pt resting, nad, vitals normal. Will get telepsych eval. Act team evaluating.   Suzi Roots, MD 03/09/13 [phone]
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
## 2013-03-10 MED ORDER — OLANZAPINE 10 MG PO TABS
20.0000 mg | ORAL_TABLET | Freq: Every day | ORAL | Status: DC
  Administered 2013-03-10: 20 mg via ORAL
  Filled 2013-03-10 (×2): qty 2

## 2013-03-10 NOTE — ED Notes (Addendum)
Up tot he bathroom to shower and change scrubs 

## 2013-03-10 NOTE — ED Notes (Signed)
Pt had talked w/ the group home person (Mr. Noah Delaine) and reports that they will not let her come back they do not feel that she is safe to come back and is in danger of harming herself.  She reports that they have talked w/ her guardian and he is aware.  Will notify act.

## 2013-03-10 NOTE — ED Notes (Signed)
To the desk asking when she is going home.  Pt is aware that they were not able to reach her group home last night and that we will try this am

## 2013-03-10 NOTE — ED Provider Notes (Signed)
Sleeping without distress. Awaiting transport to group home. BP 115/76  Pulse 54  Temp(Src) 97.6 F (36.4 C) (Oral)  Resp 14  SpO2 99%   Glynn Octave, MD 03/10/13 [phone]

## 2013-03-10 NOTE — ED Notes (Signed)
Up to the desk on the phone 

## 2013-03-10 NOTE — ED Notes (Signed)
Up to the bathroom 

## 2013-03-10 NOTE — ED Notes (Addendum)
ACT is aware that group home will not let her return

## 2013-03-10 NOTE — ED Notes (Addendum)
Pt sitting quietly, reports that she is frustrated about the group home.  Pt reports that she does not feel that she can go to them if she has a problem or to discuss anything w/ them because they don't listen to her and blame her for everythings that happens and treat her mean.

## 2013-03-10 NOTE — ED Notes (Signed)
Message left for administrator on answering machine to call

## 2013-03-10 NOTE — ED Notes (Signed)
Unable to reach group home staff at this time for transportation. ACT team aware. Will attempt to call again in the am.

## 2013-03-11 MED ORDER — OLANZAPINE 20 MG PO TABS: 20.0000 mg | ORAL_TABLET | Freq: Every day | ORAL | Status: DC

## 2013-03-11 MED ORDER — DIVALPROEX SODIUM ER 500 MG PO TB24: 1000.0000 mg | ORAL_TABLET | Freq: Every day | ORAL | Status: DC

## 2013-03-11 MED ORDER — TOPIRAMATE 100 MG PO TABS: 200.0000 mg | ORAL_TABLET | Freq: Every day | ORAL | Status: DC

## 2013-03-11 MED ORDER — CLONAZEPAM 1 MG PO TABS: 1.0000 mg | ORAL_TABLET | Freq: Every day | ORAL | Status: DC

## 2013-03-11 MED ORDER — CITALOPRAM HYDROBROMIDE 20 MG PO TABS: 20.0000 mg | ORAL_TABLET | Freq: Every day | ORAL | Status: DC

## 2013-03-11 NOTE — Progress Notes (Addendum)
Per chart review and discussion with act team, patient is from a group home. Per chart review, patient cleared by telepsych to return to group home. Per discussion with act team, group home is refusing patient to return. Per discussion with act, patient group home wants to discuss with pt guardian and left pt guardian a message. This Clinical research associate, also left a message for Si Raider [phone]).   Catha Gosselin, Theresia Majors  [phone] .03/11/2013 1049am   Addendum: 13:13pm  Per discussion with act team, patient is returning to group home and transportation will be provided between 145 and 215pm .   .Catha Gosselin, LCSWA  [phone] .03/11/2013 1313pm

## 2013-03-11 NOTE — ED Provider Notes (Signed)
The patient is currently medically stable. She is waiting for psychiatric placement. Initial plan was to have her discharged back to her group home. The group home will not take her back at this point. We'll have to have the psychiatric team reassess her disposition.  Filed Vitals:   03/11/13 0648  BP: 96/62  Pulse: 55  Temp: 97.7 F (36.5 C)  Resp: 18     Celene Kras, MD 03/11/13 [phone]

## 2013-04-10 ENCOUNTER — Encounter (HOSPITAL_COMMUNITY): Payer: Self-pay | Admitting: *Deleted

## 2013-04-10 ENCOUNTER — Emergency Department (HOSPITAL_COMMUNITY)
Admission: EM | Admit: 2013-04-10 | Discharge: 2013-04-11 | Disposition: A | Discharge status: Home / Self Care | Payer: 59 | Attending: Emergency Medicine | Admitting: Emergency Medicine

## 2013-04-10 DIAGNOSIS — Z79899 Other long term (current) drug therapy: Secondary | ICD-10-CM | POA: Diagnosis not present

## 2013-04-10 DIAGNOSIS — F209 Schizophrenia, unspecified: Secondary | ICD-10-CM | POA: Insufficient documentation

## 2013-04-10 DIAGNOSIS — F911 Conduct disorder, childhood-onset type: Secondary | ICD-10-CM | POA: Insufficient documentation

## 2013-04-10 DIAGNOSIS — F329 Major depressive disorder, single episode, unspecified: Secondary | ICD-10-CM | POA: Insufficient documentation

## 2013-04-10 DIAGNOSIS — F3289 Other specified depressive episodes: Secondary | ICD-10-CM | POA: Insufficient documentation

## 2013-04-10 DIAGNOSIS — R4689 Other symptoms and signs involving appearance and behavior: Secondary | ICD-10-CM

## 2013-04-10 DIAGNOSIS — Z008 Encounter for other general examination: Secondary | ICD-10-CM | POA: Diagnosis present

## 2013-04-10 LAB — CBC WITH DIFFERENTIAL/PLATELET
HCT: 36 % (ref 36.0–46.0)
Hemoglobin: 12 g/dL (ref 12.0–15.0)
Lymphocytes Relative: 53 % — ABNORMAL HIGH (ref 12–46)
Monocytes Absolute: 0.4 10*3/uL (ref 0.1–1.0)
Monocytes Relative: 6 % (ref 3–12)
Neutro Abs: 2.8 10*3/uL (ref 1.7–7.7)
RBC: 4.62 MIL/uL (ref 3.87–5.11)
WBC: 7 10*3/uL (ref 4.0–10.5)

## 2013-04-10 LAB — RAPID URINE DRUG SCREEN, HOSP PERFORMED
Barbiturates: NOT DETECTED
Benzodiazepines: NOT DETECTED
Cocaine: NOT DETECTED
Opiates: NOT DETECTED

## 2013-04-10 LAB — BASIC METABOLIC PANEL
BUN: 8 mg/dL (ref 6–23)
CO2: 23 mEq/L (ref 19–32)
Chloride: 104 mEq/L (ref 96–112)
Creatinine, Ser: 0.59 mg/dL (ref 0.50–1.10)

## 2013-04-10 LAB — URINALYSIS, ROUTINE W REFLEX MICROSCOPIC
Bilirubin Urine: NEGATIVE
Glucose, UA: NEGATIVE mg/dL
Ketones, ur: NEGATIVE mg/dL
Leukocytes, UA: NEGATIVE
Specific Gravity, Urine: 1.012 (ref 1.005–1.030)
pH: 7 (ref 5.0–8.0)

## 2013-04-10 MED ORDER — IBUPROFEN 600 MG PO TABS: 600.0000 mg | ORAL_TABLET | Freq: Three times a day (TID) | ORAL | Status: DC | PRN

## 2013-04-10 MED ORDER — OLANZAPINE 5 MG PO TABS
20.0000 mg | ORAL_TABLET | Freq: Every day | ORAL | Status: DC
  Administered 2013-04-11: 20 mg via ORAL
  Filled 2013-04-10: qty 4

## 2013-04-10 MED ORDER — ACETAMINOPHEN 325 MG PO TABS: 650.0000 mg | ORAL_TABLET | ORAL | Status: DC | PRN

## 2013-04-10 MED ORDER — TOPIRAMATE 25 MG PO TABS
200.0000 mg | ORAL_TABLET | Freq: Every day | ORAL | Status: DC
Start: 2013-04-10 — End: 2013-04-11
  Administered 2013-04-11: 200 mg via ORAL
  Filled 2013-04-10: qty 8

## 2013-04-10 MED ORDER — VALACYCLOVIR HCL 500 MG PO TABS
500.0000 mg | ORAL_TABLET | Freq: Two times a day (BID) | ORAL | Status: DC
  Administered 2013-04-11 (×2): 500 mg via ORAL
  Filled 2013-04-10 (×4): qty 1

## 2013-04-10 MED ORDER — CLONAZEPAM 1 MG PO TABS
1.0000 mg | ORAL_TABLET | Freq: Every day | ORAL | Status: DC
Start: 2013-04-10 — End: 2013-04-11
  Administered 2013-04-11: 1 mg via ORAL
  Filled 2013-04-10: qty 1

## 2013-04-10 MED ORDER — CITALOPRAM HYDROBROMIDE 20 MG PO TABS
20.0000 mg | ORAL_TABLET | Freq: Every day | ORAL | Status: DC
  Administered 2013-04-11: 20 mg via ORAL
  Filled 2013-04-10: qty 1

## 2013-04-10 MED ORDER — DIVALPROEX SODIUM ER 500 MG PO TB24
1000.0000 mg | ORAL_TABLET | Freq: Every day | ORAL | Status: DC
Start: 2013-04-10 — End: 2013-04-11
  Administered 2013-04-11: 1000 mg via ORAL
  Filled 2013-04-10 (×3): qty 2

## 2013-04-10 MED ORDER — LORAZEPAM 1 MG PO TABS: 1.0000 mg | ORAL_TABLET | Freq: Three times a day (TID) | ORAL | Status: DC | PRN

## 2013-04-10 NOTE — ED Notes (Signed)
Spoke with Anette Riedel at [facility], he states pt can not return to group home. He advised Clinical research associate to call Purvis Sheffield, pts gaurdian at [facility].
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
## 2013-04-10 NOTE — ED Notes (Signed)
Tele psych request faxed 
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
## 2013-04-11 MED ORDER — CLONAZEPAM 1 MG PO TABS: 1.0000 mg | ORAL_TABLET | Freq: Every evening | ORAL | Status: DC | PRN

## 2013-04-11 MED ORDER — VALACYCLOVIR HCL 500 MG PO TABS: 500.0000 mg | ORAL_TABLET | Freq: Two times a day (BID) | ORAL | Status: DC

## 2013-04-11 MED ORDER — CITALOPRAM HYDROBROMIDE 20 MG PO TABS: 20.0000 mg | ORAL_TABLET | Freq: Every day | ORAL | Status: DC

## 2013-04-11 MED ORDER — TOPIRAMATE 100 MG PO TABS: 200.0000 mg | ORAL_TABLET | Freq: Two times a day (BID) | ORAL | Status: DC

## 2013-04-11 MED ORDER — DIVALPROEX SODIUM 500 MG PO DR TAB: 500.0000 mg | DELAYED_RELEASE_TABLET | Freq: Three times a day (TID) | ORAL | Status: DC

## 2013-04-11 MED ORDER — OLANZAPINE 20 MG PO TABS: 20.0000 mg | ORAL_TABLET | Freq: Every day | ORAL | Status: DC

## 2013-04-11 NOTE — BH Assessment (Signed)
BHH Assessment Progress Note     Spoke to Dr. Shyrl Numbers regarding patients medications. He is willing to Clinical research associate a 1 week RX for all medications. Writer informed patients group home owner Ms. Arlene of this information. Writer also advised Ms. Arlene that she would need to follow up with a provider-psychiatrist or PCP so that patient is able to continue her medication regimen. Ms. Cira Rue agreed stating she would follow up with Dr. Lovell Sheehan, patients PCP.   Writer relayed the above information to patient's DSS worker/guardian Darryl by leaving a detailed voicemail regarding patients disposition and recommendations.   Group home staff will be here to pick patient up by 11:30am.

## 2013-04-11 NOTE — ED Notes (Signed)
Up to the bathroom 

## 2013-04-11 NOTE — ED Notes (Signed)
Up tot he bathroom to shower and change scrubs 

## 2013-04-11 NOTE — ED Notes (Signed)
In the bathroom

## 2013-04-11 NOTE — ED Notes (Signed)
Pt is aware that the group home has agreed to accept her back.

## 2013-04-11 NOTE — ED Notes (Signed)
Pt's ride is here to pick her up.

## 2013-04-11 NOTE — ED Notes (Signed)
Ms Krista [name] contacted and will  Re contact the person who is picking the pt up.

## 2013-04-11 NOTE — ED Notes (Addendum)
Written dc instructions reviewed w/ pt.  Pt encouraged to work w/ staff if the situation reccurred, pt verbalized understanding and voiced that she would and that she did not want to come back to the hospital any more.  On dc pt also reported that the voices were still coming and going and the she tells them to go away because she knows that the are not real.  Pt encouraged to discuss it w/ her therapist at her appt tomorrow and  reported that she would.

## 2013-04-11 NOTE — BHH Group Notes (Addendum)
Telepsych recommends discharge back to group-[facility] [phone]. However, notes indicate that nursing staff spoke to [place] regarding return. He sts that patient is unable to return to group home. He advised staff to contact patient's guardian Purvis Sheffield [phone].   Writer contacted group home to request clarification and reasoning as to patient not being able to return. Writer left a voicemail requesting group home owner or staff member to call back.   Also, contacted Purvis Sheffield and left a voicemail asking him to return call to discuss patient's disposition. His voicemail prompts provided a emergency line [phone]. This number was also called and voicemail was left asking a guardian or staff to call back regarding this patient's disposition.

## 2013-04-11 NOTE — ED Provider Notes (Signed)
Assuming care of patient this morning. Patient in the ED for aggressive behavior. Awaiting ACT team to help facilitate transfer back to her group home. Workup thus far is otherwise negative. Patient had no complains, no concerns from the nursing side. Will continue to monitor.   Filed Vitals:   04/11/13 0453  BP: 110/70  Pulse: 53  Temp: 97.3 F (36.3 C)  Resp: 18     Derwood Kaplan, MD 04/11/13 [phone]

## 2013-04-11 NOTE — BH Assessment (Signed)
BHH Assessment Progress Note    Received a call from Purvis Sheffield # [phone]. He explains that patient is able to return back to her group home [facility]. He sts that the plan is for patient to return back for 30 days. The day returns which will be today she will be given a eviction notice. During patient's 30 days at the group home Darryl will be working on patients placement into another group home or possibly nursing home. Nelma Rothman explains that the group home is unable to continue caring for patient due to her behaviors, therefore; a higher level of care is being sought.  Writer contacted Ms. Arlene [phone] to verify if patient is ok to return. Ms. Cira Rue agreed to pick patient up from the ED. However, she asked if patient could have prescriptions for her psychotropics. Sts that she is unable to get patient an appointment at [facility] w/o her guardian here. Sts that her guardian Rachael Fee is in [place] and hasn't been able to get to [employer] [facility] for patients appointments. Writer informed Ms. Arlene that the medication manner would be discussed with EDP and/or psychiatrist. Writer will re-contact Ms. Arlene after consulting with EDP and/or psychiatrist.

## 2013-04-16 NOTE — ED Provider Notes (Signed)
Medical screening examination/treatment/procedure(s) were performed by non-physician practitioner and as supervising physician I was immediately available for consultation/collaboration.  Myranda Pavone, MD 04/16/13 1446 

## 2013-07-08 ENCOUNTER — Emergency Department: Payer: Self-pay | Admitting: Emergency Medicine

## 2013-07-08 LAB — COMPREHENSIVE METABOLIC PANEL
Albumin: 3.7 g/dL (ref 3.4–5.0)
Alkaline Phosphatase: 65 U/L (ref 50–136)
Anion Gap: 8 (ref 7–16)
BUN: 6 mg/dL — ABNORMAL LOW (ref 7–18)
Chloride: 108 mmol/L — ABNORMAL HIGH (ref 98–107)
Co2: 22 mmol/L (ref 21–32)
EGFR (African American): >60
EGFR (Non-African Amer.): >60
Glucose: 80 mg/dL (ref 65–99)
Osmolality: 272 (ref 275–301)
SGOT(AST): 32 U/L (ref 15–37)
SGPT (ALT): 25 U/L (ref 12–78)
Sodium: 138 mmol/L (ref 136–145)

## 2013-07-08 LAB — CBC
MCH: 26.7 pg (ref 26.0–34.0)
MCHC: 33.2 g/dL (ref 32.0–36.0)
Platelet: 138 10*3/uL — ABNORMAL LOW (ref 150–440)
WBC: 6 10*3/uL (ref 3.6–11.0)

## 2013-07-08 LAB — URINALYSIS, COMPLETE
Bacteria: NONE SEEN
Bilirubin,UR: NEGATIVE
Blood: NEGATIVE
Ketone: NEGATIVE
Leukocyte Esterase: NEGATIVE
Nitrite: NEGATIVE
Ph: 7 (ref 4.5–8.0)
Protein: NEGATIVE
RBC,UR: 1 /HPF (ref 0–5)
WBC UR: <1 /HPF (ref 0–5)

## 2013-07-08 LAB — DRUG SCREEN, URINE
Amphetamines, Ur Screen: NEGATIVE (ref ?–1000)
Opiate, Ur Screen: NEGATIVE (ref ?–300)

## 2014-03-01 IMAGING — CR DG CHEST 2V
2 series · 2 of 2 positions shown · non-contrast
Comparison: None.

CLINICAL DATA: Chest pain.

CHEST - 2 VIEW

[w chest pa]
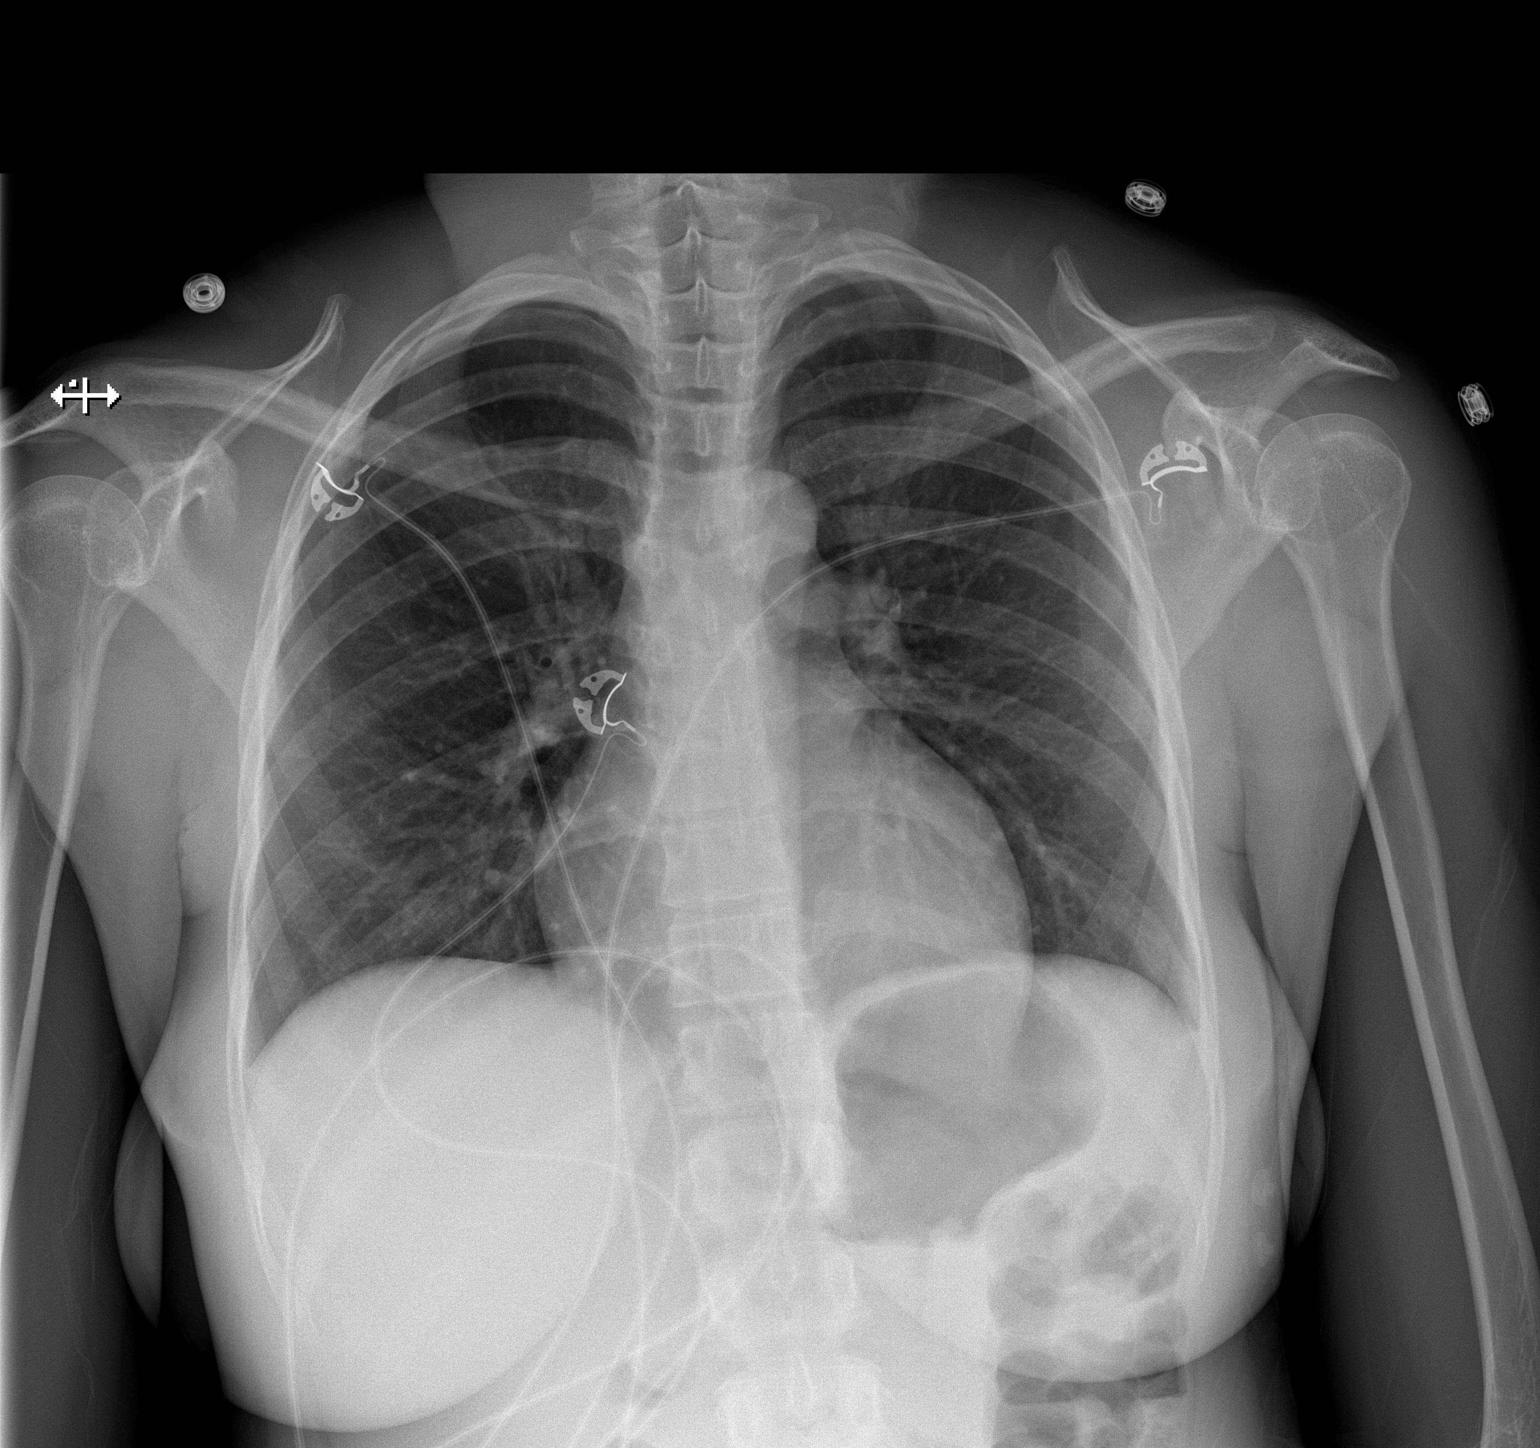

[w chest lat]
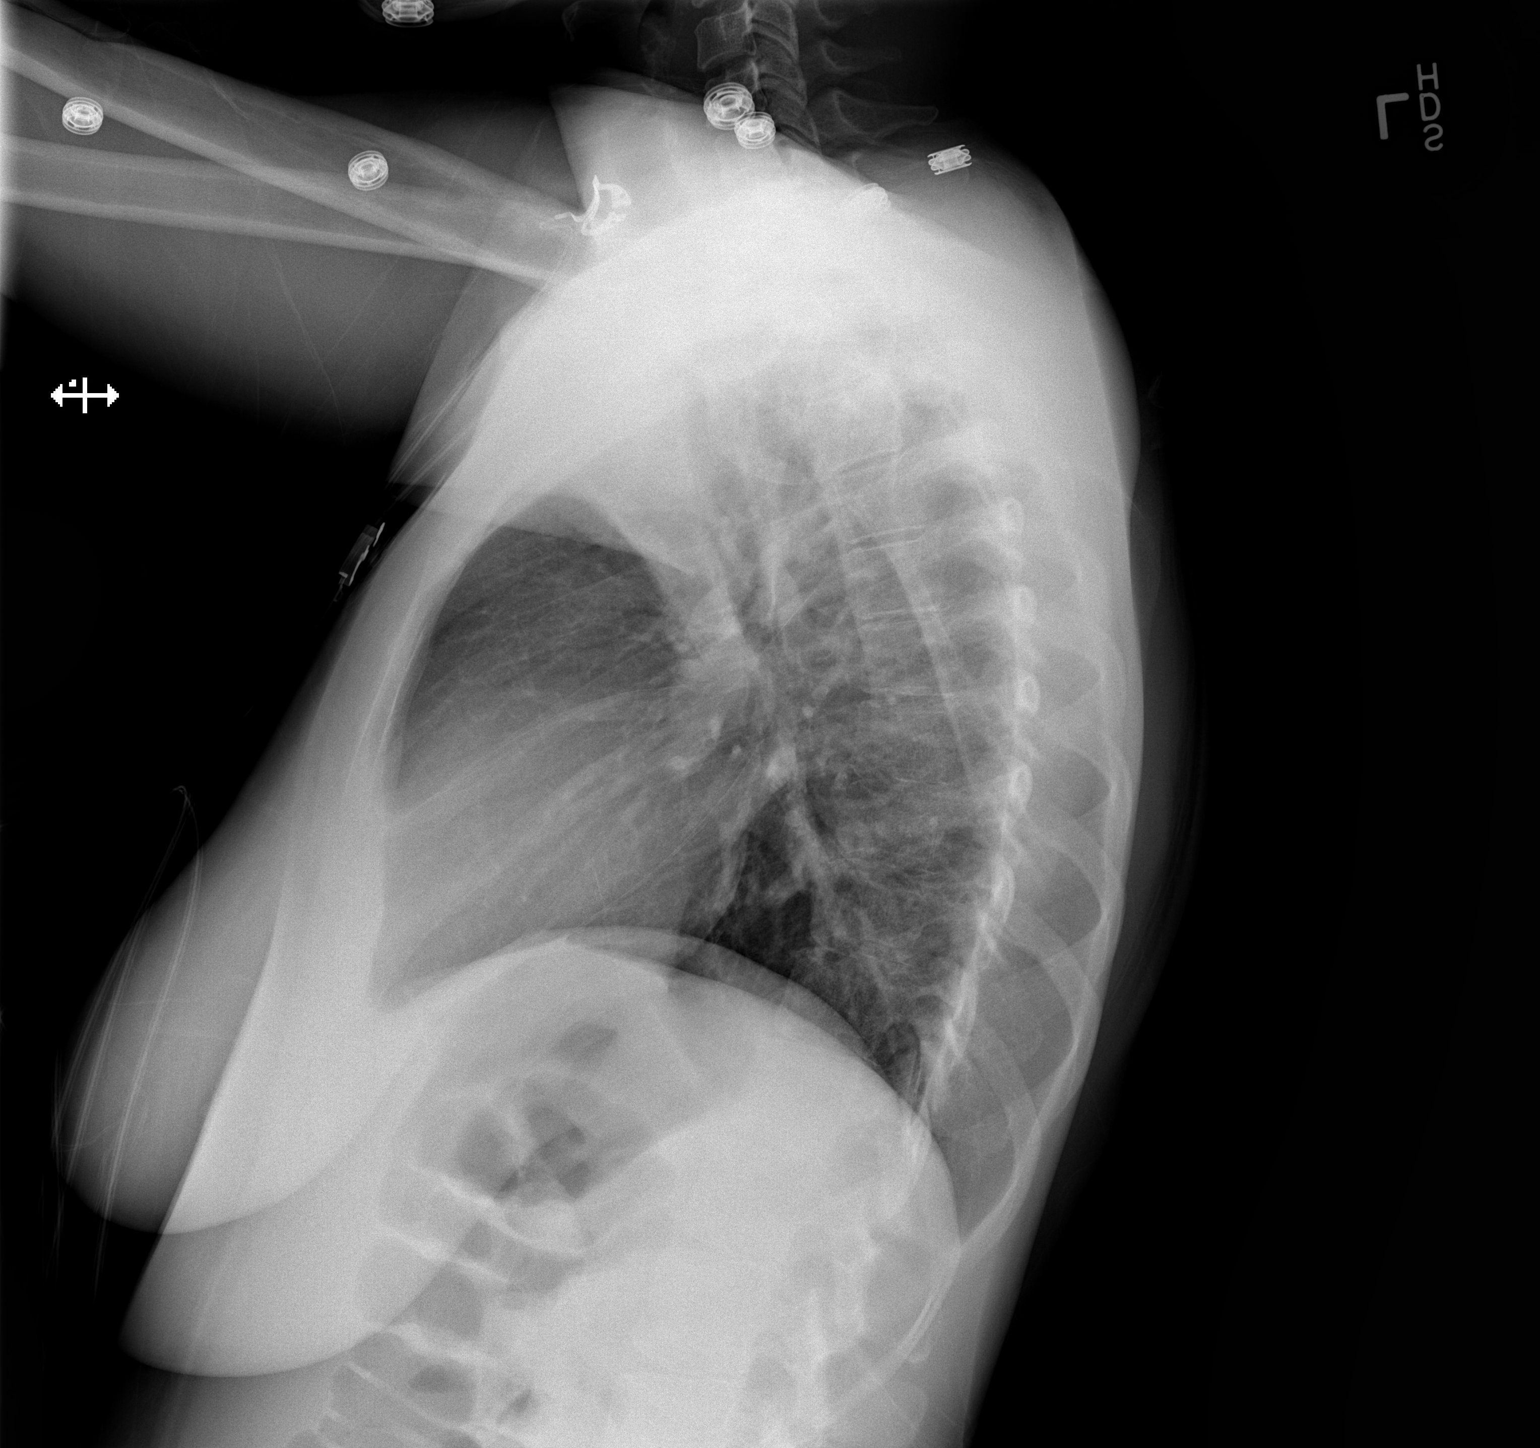

[2 of 2 positions shown; findings below may reference images not displayed]

FINDINGS: The heart size and mediastinal contours are within
normal limits.  Both lungs are clear.  The visualized skeletal
structures are unremarkable.
IMPRESSION: No active cardiopulmonary disease.

## 2014-03-28 ENCOUNTER — Emergency Department: Payer: Self-pay | Admitting: Emergency Medicine

## 2014-03-28 LAB — URINALYSIS, COMPLETE
Bilirubin,UR: NEGATIVE
Glucose,UR: NEGATIVE mg/dL (ref 0–75)
KETONE: NEGATIVE
LEUKOCYTE ESTERASE: NEGATIVE
NITRITE: NEGATIVE
Ph: 7 (ref 4.5–8.0)
Protein: NEGATIVE
RBC,UR: <1 /HPF (ref 0–5)
SPECIFIC GRAVITY: 1.004 (ref 1.003–1.030)
Squamous Epithelial: <1
WBC UR: <1 /HPF (ref 0–5)

## 2014-03-28 LAB — COMPREHENSIVE METABOLIC PANEL
ALT: 18 U/L (ref 12–78)
Albumin: 3.8 g/dL (ref 3.4–5.0)
Alkaline Phosphatase: 78 U/L
Anion Gap: 9 (ref 7–16)
BILIRUBIN TOTAL: 0.2 mg/dL (ref 0.2–1.0)
BUN: 10 mg/dL (ref 7–18)
CHLORIDE: 111 mmol/L — AB (ref 98–107)
CREATININE: 0.65 mg/dL (ref 0.60–1.30)
Calcium, Total: 8.4 mg/dL — ABNORMAL LOW (ref 8.5–10.1)
Co2: 22 mmol/L (ref 21–32)
EGFR (Non-African Amer.): >60
Glucose: 78 mg/dL (ref 65–99)
Osmolality: 281 (ref 275–301)
Potassium: 3.5 mmol/L (ref 3.5–5.1)
SGOT(AST): 26 U/L (ref 15–37)
SODIUM: 142 mmol/L (ref 136–145)
Total Protein: 8.4 g/dL — ABNORMAL HIGH (ref 6.4–8.2)

## 2014-03-28 LAB — DRUG SCREEN, URINE

## 2014-03-28 LAB — CBC
HCT: 38.2 % (ref 35.0–47.0)
HGB: 12.2 g/dL (ref 12.0–16.0)
MCH: 26.6 pg (ref 26.0–34.0)
MCHC: 32 g/dL (ref 32.0–36.0)
MCV: 83 fL (ref 80–100)
Platelet: 152 10*3/uL (ref 150–440)
RBC: 4.58 10*6/uL (ref 3.80–5.20)
RDW: 15.5 % — ABNORMAL HIGH (ref 11.5–14.5)
WBC: 5.1 10*3/uL (ref 3.6–11.0)

## 2014-03-28 LAB — SALICYLATE LEVEL

## 2014-03-28 LAB — ETHANOL
Ethanol %: <0.003 % (ref 0.000–0.080)
Ethanol: <3 mg/dL

## 2014-03-28 LAB — ACETAMINOPHEN LEVEL: Acetaminophen: <2 ug/mL

## 2014-03-29 LAB — VALPROIC ACID LEVEL: Valproic Acid: 71 ug/mL

## 2014-04-24 ENCOUNTER — Emergency Department: Payer: Self-pay | Admitting: Emergency Medicine

## 2014-04-24 LAB — DRUG SCREEN, URINE

## 2014-04-24 LAB — ETHANOL
Ethanol %: <0.003 % (ref 0.000–0.080)
Ethanol: <3 mg/dL

## 2014-04-24 LAB — COMPREHENSIVE METABOLIC PANEL
ANION GAP: 5 — AB (ref 7–16)
Albumin: 3.7 g/dL (ref 3.4–5.0)
Alkaline Phosphatase: 59 U/L
BUN: 7 mg/dL (ref 7–18)
Bilirubin,Total: 0.2 mg/dL (ref 0.2–1.0)
CREATININE: 0.69 mg/dL (ref 0.60–1.30)
Calcium, Total: 8.3 mg/dL — ABNORMAL LOW (ref 8.5–10.1)
Chloride: 111 mmol/L — ABNORMAL HIGH (ref 98–107)
Co2: 24 mmol/L (ref 21–32)
EGFR (African American): >60
Glucose: 72 mg/dL (ref 65–99)
OSMOLALITY: 276 (ref 275–301)
Potassium: 3.4 mmol/L — ABNORMAL LOW (ref 3.5–5.1)
SGOT(AST): 27 U/L (ref 15–37)
SGPT (ALT): 15 U/L (ref 12–78)
Sodium: 140 mmol/L (ref 136–145)
TOTAL PROTEIN: 7.7 g/dL (ref 6.4–8.2)

## 2014-04-24 LAB — URINALYSIS, COMPLETE
BILIRUBIN, UR: NEGATIVE
Blood: NEGATIVE
Glucose,UR: NEGATIVE mg/dL (ref 0–75)
Ketone: NEGATIVE
Leukocyte Esterase: NEGATIVE
Nitrite: NEGATIVE
Ph: 7 (ref 4.5–8.0)
Protein: NEGATIVE
RBC, UR: NONE SEEN /HPF (ref 0–5)
SPECIFIC GRAVITY: 1.002 (ref 1.003–1.030)
SQUAMOUS EPITHELIAL: NONE SEEN
WBC UR: NONE SEEN /HPF (ref 0–5)

## 2014-04-24 LAB — CBC
HCT: 36.2 % (ref 35.0–47.0)
HGB: 11.5 g/dL — AB (ref 12.0–16.0)
MCH: 26.2 pg (ref 26.0–34.0)
MCHC: 31.7 g/dL — ABNORMAL LOW (ref 32.0–36.0)
MCV: 83 fL (ref 80–100)
Platelet: 160 10*3/uL (ref 150–440)
RBC: 4.38 10*6/uL (ref 3.80–5.20)
RDW: 15.3 % — ABNORMAL HIGH (ref 11.5–14.5)
WBC: 5.2 10*3/uL (ref 3.6–11.0)

## 2014-04-24 LAB — ACETAMINOPHEN LEVEL: Acetaminophen: <2 ug/mL

## 2014-04-24 LAB — SALICYLATE LEVEL

## 2014-08-05 LAB — COMPREHENSIVE METABOLIC PANEL
ALBUMIN: 3.8 g/dL (ref 3.4–5.0)
AST: 16 U/L (ref 15–37)
Alkaline Phosphatase: 47 U/L
Anion Gap: 9 (ref 7–16)
BILIRUBIN TOTAL: 0.2 mg/dL (ref 0.2–1.0)
BUN: 8 mg/dL (ref 7–18)
CALCIUM: 8.6 mg/dL (ref 8.5–10.1)
CREATININE: 0.73 mg/dL (ref 0.60–1.30)
Chloride: 112 mmol/L — ABNORMAL HIGH (ref 98–107)
Co2: 17 mmol/L — ABNORMAL LOW (ref 21–32)
EGFR (African American): >60
Glucose: 88 mg/dL (ref 65–99)
OSMOLALITY: 273 (ref 275–301)
POTASSIUM: 3.4 mmol/L — AB (ref 3.5–5.1)
SGPT (ALT): 19 U/L
Sodium: 138 mmol/L (ref 136–145)
TOTAL PROTEIN: 7.8 g/dL (ref 6.4–8.2)

## 2014-08-05 LAB — URINALYSIS, COMPLETE
BILIRUBIN, UR: NEGATIVE
Bacteria: NONE SEEN
Blood: NEGATIVE
GLUCOSE, UR: NEGATIVE mg/dL (ref 0–75)
KETONE: NEGATIVE
LEUKOCYTE ESTERASE: NEGATIVE
Nitrite: NEGATIVE
PH: 7 (ref 4.5–8.0)
Protein: NEGATIVE
Specific Gravity: 1.009 (ref 1.003–1.030)
WBC UR: <1 /HPF (ref 0–5)

## 2014-08-05 LAB — DRUG SCREEN, URINE
Amphetamines, Ur Screen: NEGATIVE (ref ?–1000)
BENZODIAZEPINE, UR SCRN: NEGATIVE (ref ?–200)
Barbiturates, Ur Screen: NEGATIVE (ref ?–200)
CANNABINOID 50 NG, UR ~~LOC~~: NEGATIVE (ref ?–50)
Cocaine Metabolite,Ur ~~LOC~~: NEGATIVE (ref ?–300)
MDMA (ECSTASY) UR SCREEN: NEGATIVE (ref ?–500)
METHADONE, UR SCREEN: NEGATIVE (ref ?–300)
Opiate, Ur Screen: NEGATIVE (ref ?–300)
Phencyclidine (PCP) Ur S: NEGATIVE (ref ?–25)
TRICYCLIC, UR SCREEN: NEGATIVE (ref ?–1000)

## 2014-08-05 LAB — CBC
HCT: 37.5 % (ref 35.0–47.0)
HGB: 12.1 g/dL (ref 12.0–16.0)
MCH: 26.5 pg (ref 26.0–34.0)
MCHC: 32.4 g/dL (ref 32.0–36.0)
MCV: 82 fL (ref 80–100)
PLATELETS: 159 10*3/uL (ref 150–440)
RBC: 4.58 10*6/uL (ref 3.80–5.20)
RDW: 15.2 % — ABNORMAL HIGH (ref 11.5–14.5)
WBC: 6 10*3/uL (ref 3.6–11.0)

## 2014-08-05 LAB — ETHANOL

## 2014-08-05 LAB — PREGNANCY, URINE: Pregnancy Test, Urine: NEGATIVE m[IU]/mL

## 2014-08-06 ENCOUNTER — Inpatient Hospital Stay: Payer: Self-pay | Admitting: Psychiatry

## 2014-08-08 LAB — LIPID PANEL
CHOLESTEROL: 170 mg/dL (ref 0–200)
HDL: 55 mg/dL (ref 40–60)
Ldl Cholesterol, Calc: 103 mg/dL — ABNORMAL HIGH (ref 0–100)
TRIGLYCERIDES: 59 mg/dL (ref 0–200)
VLDL CHOLESTEROL, CALC: 12 mg/dL (ref 5–40)

## 2014-08-08 LAB — HEMOGLOBIN A1C: HEMOGLOBIN A1C: 5.5 % (ref 4.2–6.3)

## 2014-08-08 LAB — VALPROIC ACID LEVEL: Valproic Acid: 85 ug/mL

## 2014-10-15 ENCOUNTER — Emergency Department: Payer: Self-pay | Admitting: Student

## 2014-10-15 LAB — CBC
HCT: 36.8 % (ref 35.0–47.0)
HGB: 11.6 g/dL — ABNORMAL LOW (ref 12.0–16.0)
MCH: 26.8 pg (ref 26.0–34.0)
MCHC: 31.5 g/dL — ABNORMAL LOW (ref 32.0–36.0)
MCV: 85 fL (ref 80–100)
Platelet: 120 10*3/uL — ABNORMAL LOW (ref 150–440)
RBC: 4.32 10*6/uL (ref 3.80–5.20)
RDW: 15.4 % — AB (ref 11.5–14.5)
WBC: 6.1 10*3/uL (ref 3.6–11.0)

## 2014-10-15 LAB — COMPREHENSIVE METABOLIC PANEL
ALBUMIN: 3.5 g/dL (ref 3.4–5.0)
ALT: 18 U/L
ANION GAP: 8 (ref 7–16)
Alkaline Phosphatase: 61 U/L
BILIRUBIN TOTAL: 0.1 mg/dL — AB (ref 0.2–1.0)
BUN: 3 mg/dL — ABNORMAL LOW (ref 7–18)
CALCIUM: 7.6 mg/dL — AB (ref 8.5–10.1)
CO2: 19 mmol/L — AB (ref 21–32)
Chloride: 110 mmol/L — ABNORMAL HIGH (ref 98–107)
Creatinine: 0.59 mg/dL — ABNORMAL LOW (ref 0.60–1.30)
EGFR (African American): >60
EGFR (Non-African Amer.): >60
Glucose: 76 mg/dL (ref 65–99)
Osmolality: 269 (ref 275–301)
POTASSIUM: 3.4 mmol/L — AB (ref 3.5–5.1)
SGOT(AST): 32 U/L (ref 15–37)
SODIUM: 137 mmol/L (ref 136–145)
TOTAL PROTEIN: 7.4 g/dL (ref 6.4–8.2)

## 2014-10-15 LAB — ETHANOL

## 2014-10-15 LAB — SALICYLATE LEVEL: Salicylates, Serum: <1.7 mg/dL

## 2014-10-15 LAB — URINALYSIS, COMPLETE
BILIRUBIN, UR: NEGATIVE
Blood: NEGATIVE
GLUCOSE, UR: NEGATIVE mg/dL (ref 0–75)
Ketone: NEGATIVE
LEUKOCYTE ESTERASE: NEGATIVE
Nitrite: NEGATIVE
PROTEIN: NEGATIVE
Ph: 6 (ref 4.5–8.0)
RBC, UR: NONE SEEN /HPF (ref 0–5)
SPECIFIC GRAVITY: 1 (ref 1.003–1.030)
Squamous Epithelial: <1
WBC UR: NONE SEEN /HPF (ref 0–5)

## 2014-10-15 LAB — ACETAMINOPHEN LEVEL

## 2014-10-15 LAB — DRUG SCREEN, URINE
AMPHETAMINES, UR SCREEN: NEGATIVE (ref ?–1000)
Barbiturates, Ur Screen: NEGATIVE (ref ?–200)
Benzodiazepine, Ur Scrn: NEGATIVE (ref ?–200)
CANNABINOID 50 NG, UR ~~LOC~~: NEGATIVE (ref ?–50)
COCAINE METABOLITE, UR ~~LOC~~: NEGATIVE (ref ?–300)
MDMA (Ecstasy)Ur Screen: NEGATIVE (ref ?–500)
Methadone, Ur Screen: NEGATIVE (ref ?–300)
Opiate, Ur Screen: NEGATIVE (ref ?–300)
Phencyclidine (PCP) Ur S: NEGATIVE (ref ?–25)
TRICYCLIC, UR SCREEN: NEGATIVE (ref ?–1000)

## 2014-10-15 LAB — PREGNANCY, URINE: Pregnancy Test, Urine: NEGATIVE m[IU]/mL

## 2015-03-27 NOTE — Consult Note (Signed)
PATIENT NAME:  Krista [name], Krista [name] MR#:  [id number] DATE OF BIRTH:  21-Mar-1981  DATE OF CONSULTATION:  07/09/2013  REFERRING PHYSICIAN:  Malachy Moan, MD. CONSULTING PHYSICIAN:  Ardeen Fillers. Garnetta Buddy, MD  REASON FOR CONSULTATION: "I got angry at my staff."   HISTORY OF PRESENT ILLNESS: The patient is a 34 year old single African American female who presented to the ED from the group home, as she got upset at her staff and tried to hurt herself in  her arms.   The patient reported that she just moved from [place] and has been living in this group home since in June 5. She reported that her surroundings are new and she does not like where she is currently living. She reported that when two people went out for a walk, she would not go as she feels like she has too much restrictions. The patient reported that she has to call her guardian, Angela Nevin, to ask for advice and approval for everything.   The patient reported that she has been living in a group home since she was 34 years old and now she is 34 years old. The patient reported that she has been in multiple group homes throughout her life and now she feels like she is a small child. The patient reported that she feels like she is trying to grow up and the staff at the group home are very supportive and they usually encourage her most of the time, but sometimes they got angry and they do not behave well.   The patient was tearful during my interview and reported that "I'm trying too hard to be perfect." She reported that she got mad yesterday and then she acted up and then went to the kitchen and used the little knife to cut on herself. "Next time, I will be careful and I will talk to my mom."  The patient reported that she was not having suicidal thoughts and she is not having any thoughts to harm herself at this time. Reported that she usually looks herself in the mirror and talks about herself. Sometimes she has low self esteem and she feels like  that their rules are too strict.    However, she feels that her current medications are making her tired and she does not like a large amount of the medications. She reported that she has a little bit of tremors in her hands. The patient currently denied having any thoughts to harm herself. She denied having any perceptual disturbances.   PAST PSYCHIATRIC HISTORY: The patient reported that she has been to several hospitals in the past. She reported that she was discharged from her previous group homes as she acted up too many times over there. She has been living in group homes throughout her life since she was 34 years old. She reported that she likes this group home and she wants to stay here.   She has history of cutting in the past and currently has four  superficial  cuts on both arms.   Her current medications make her very tired and noticed to have mild tremors in both hands.   ALLERGIES: No known drug allergies.   DRUG USE: Denied using any drugs at this time.   SOCIAL HISTORY: The patient reported that her mother lives in [place] and she is taking care of her 65-year-old daughter. She reported that she saw the doctor before moving to this area. She has good relationship with them. She denies having any pending legal charges.  VITAL SIGNS: Temperature 98.1, pulse 62, respirations 20, blood pressure 111/59.   LABORATORY DATA: Glucose 80, BUN 6, creatinine 0.66, sodium 138, potassium 3.5, chloride 108, bicarbonate 22. GFR 60, anion gap 8, osmolality 272, calcium 8.7. Blood alcohol level less than 3. Protein 7.6, albumin 3.7, bilirubin 0.3, AST 32, ALT 25. TSH 2.43.   Urine drug screen was negative.   WBC 6.0, RBC 4.46, hemoglobin 11.9, platelet count 138, RDW 16.7.   REVIEW OF SYSTEMS:  CONSTITUTIONAL: No fever or chills. No weight changes.  EYES: No double or blurred vision.  RESPIRATORY: No shortness of breath or cough.  CARDIOVASCULAR: No chest pain or orthopnea.   GASTROINTESTINAL: No abdominal pain, nausea, vomiting or diarrhea.  GENITOURINARY: No incontinence or frequency.  ENDOCRINE: No heat or cold intolerance.  LYMPHATIC: No anemia or easy bruising.  INTEGUMENTARY: No acne or rash.  MUSCULOSKELETAL: Mild tremors noted in the hands.  NEUROLOGIC: No tingling or weakness.   MENTAL STATUS EXAMINATION: The patient is a moderately built female who appeared her stated age. She was calm and cooperative and has a somewhat blunted affect while talking. She appeared somewhat depressed and was tearful during the interview. Her thought process was logical and goal-directed. Her thought content was non-delusional. She currently denied having any suicidal or homicidal ideations or plans. She denied having any perceptual disturbances. Her memory appeared intact.   DIAGNOSTIC IMPRESSION:  AXIS I: Schizophrenia. Rule out schizoaffective disorder, bipolar type.  AXIS II: None.  AXIS III: None noted.   TREATMENT PLAN:  1.  I discussed with the patient about the medications, treatment risks, benefits and alternatives. She contracted for safety and does not have any thoughts to harm herself. I will discontinue the IVC and the patient can be discharged safely to her group home.  2.  I will also adjust her medications as follows:   A.  She will continue on Celexa 20 mg p.o. q. daily.   B.  I will decrease her clonazepam to 0.5 mg p.o. b.i.d.   C.  She will continue on Depakote 500 mg p.o. b.i.d.   D.  I will decrease her Topamax to 100 mg p.o. daily.   E.  The patient will be given the prescriptions for all these medications.  3.  She will follow up with [facility] after her discharge.   The patient agreed with the plan. Advised her to contact us or to come back if she notices worsening of her symptoms and she demonstrated understanding.   Thank you for allowing me to participate in the care of this patient.    ____________________________ Ardeen FillersUzma S.  Garnetta BuddyFaheem, MD usf:np D: 07/09/2013 16:35:00 ET T: 07/09/2013 16:44:44 ET JOB#: [id number]  cc: Ardeen FillersUzma S. Garnetta BuddyFaheem, MD, <Dictator> Rhunette CroftUZMA S Ennis Delpozo MD ELECTRONICALLY SIGNED 07/18/2013 13:00

## 2015-03-28 NOTE — Consult Note (Signed)
PATIENT NAME:  Krista [name], GATHRIGHT MR#:  [id number] DATE OF BIRTH:  [birth year]  DATE OF CONSULTATION:  10/16/2014  REFERRING PHYSICIAN:   CONSULTING PHYSICIAN:  Audery Amel, MD  IDENTIFYING INFORMATION AND REASON FOR CONSULT: This is a 34 year old woman with a history of schizoaffective disorder and mild mental retardation who was brought into the hospital after losing her temper at her group home.   CHIEF COMPLAINT: "They triggered me."   HISTORY OF PRESENT ILLNESS: Information obtained from the patient and the chart. She states that she got upset with the staff yesterday. The story is rambling, but it sounds like there was some kind of conflict about whether or not she could take a shower at a particular time and that this was on top of her chronic anger and irritability, feeling like they take advantage over there. She apparently lost her temper and threw some things around and was waving a knife around. Last night she had talked about stabbing herself but it does not look like she actually cut herself anywhere. She says that she has been compliant with her medication. Not abusing any substances. Says that this is a chronic issue and she feels like the staff do not appreciate her and the hard work that she does.   PAST PSYCHIATRIC HISTORY: History of schizoaffective disorder. She was just recently in our hospital a month or so ago. She has a history of chronic cognitive impairment. Has been on several medicines in the past but currently is on clonazepam, Depakote, Topamax, citalopram as primary psychiatric medicines. Does have a history of self mutilation in the past and at least 1 prior suicide attempt. Positive prior hospitalizations.   SUBSTANCE ABUSE HISTORY: Denies any alcohol or drug use or past alcohol or drug use.   MEDICAL HISTORY: Has herpes, anemia but otherwise no identified chronic medical problems except for gastric reflux.   SOCIAL HISTORY: Lives in a group home. She says they  have given her notice and so she may only have a couple of weeks left there, which is also upsetting her. She does not know where she is going to go after that. She has children but is estranged from them because of her illness, which is upsetting to her.   FAMILY HISTORY: Does not know of any family history of mental illness.   CURRENT MEDICATIONS: Clonazepam 1 mg twice a day, Depakote 500 mg twice a day, (Dictation Anomaly) <<colaceMISSING TEXT>> 200 mg twice a day, citalopram 20 mg once a day, olanzapine 20 mg once a day, valacyclovir 500 mg twice a day, iron sulfate 325 mg once a day, senna p.r.n., Carafate 1 gram 3 times a day.   ALLERGIES: No known drug allergies.   REVIEW OF SYSTEMS: Mood is feeling a little bit better. Denies any suicidal or homicidal ideation. Denies that she is having any hallucinations. Physically has no complaints. No pain. No nausea. No GI or pulmonary complaints.   MENTAL STATUS EXAMINATION: Oddly made up woman, looks her stated age. Cooperative with the interview. Has a hard time staying on topic. Eye contact decreased. Psychomotor activity fidgety. Speech is slow and gets loud at times. Affect: Has some lability. She can easily get to crying again. Mood is stated as being all right. Thoughts are slow. No bizarre or obviously delusional thoughts. Denies any suicidal or homicidal ideation. Denies any hallucinations. Can remember 3/3 objects immediately, 1 out of 3 at 3 minutes. She is alert and oriented x 4. Chronic cognitive impairment is clear.  LABORATORY RESULTS: Pregnancy test negative. Salicylates and acetaminophen and alcohol negative. Chemistry panel: Low potassium 3.4, elevated chloride 110, creatinine low at 0.59, bilirubin low. CBC:  Low hemoglobin 11.6. Urinalysis unremarkable. Drug screen negative.   VITAL SIGNS: Emergency Room:  Blood pressure 103/57, respirations 20, pulse 68, temperature 98.   ASSESSMENT: A 34 year old woman with a history of  schizoaffective disorder lost her temper at the group home last night. Did not injure herself or anyone else. She has been calm and appropriate in the Emergency Room for the last day. Currently denies any suicidal or homicidal ideation. Not apparently bizarre or paranoid. Agrees to take her medicines. At this point, no longer requires hospitalization and she wants to go back to her group home.   TREATMENT PLAN: No change to medicine. The patient will be discharged back to her group home. She is to follow with Dr. Marguerite OleaMoffett and [facility] [place].   DIAGNOSIS, PRINCIPAL AND PRIMARY:  AXIS I: Schizoaffective disorder, bipolar type.   SECONDARY DIAGNOSES:  AXIS I: No further.  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  AXIS III: Gastric reflux disease and mild anemia.      ____________________________ Audery AmelJohn T. Darby Fleeman, MD jtc:AT D: 10/16/2014 23:52:31 ET T: 10/17/2014 01:26:11 ET JOB#: [id number]  cc: Audery AmelJohn T. Gamaliel Charney, MD, <Dictator> Audery AmelJOHN T Laportia Carley MD ELECTRONICALLY SIGNED 10/20/2014 17:03

## 2015-03-28 NOTE — Consult Note (Signed)
Brief Consult Note: Diagnosis: Schizoaffective disorder bipolar type.   Patient was seen by consultant.   Consult note dictated.   Recommend further assessment or treatment.   Orders entered.   Comments: [name] has h/o mental illness. She was brought from group home for suicidal ideation. She is no longer suicidal, homicidal or psychotic.  PLAN: 1. The patient does not meet criteria for IVC. Please discharge as appropriate.   2. She is to continue all medications as prescribed by her primary psychiatrist. No Rx necesary.  3. [facility].  4. Will call group home will pick her up.  5. I tried to inform Otilio Carpenerryl Moore, her guardian at [facility] but the number [phone], that the patient gave me, is disconnected...  Electronic Signatures: Kristine LineaPucilowska, Willard Farquharson (MD)  (Signed 25-Apr-15 16:49)  Authored: Brief Consult Note   Last Updated: 25-Apr-15 16:49 by Kristine LineaPucilowska, Mayank Teuscher (MD)

## 2015-03-28 NOTE — Consult Note (Signed)
PATIENT NAME:  Krista [name], Krista MR#:  [id number] DATE OF BIRTH:  06-19-1981  DATE OF CONSULTATION:  08/05/2014  REFERRING PHYSICIAN:  ER CONSULTING PHYSICIAN:  Audery AmelJohn T. Macalister Arnaud, MD  IDENTIFYING INFORMATION AND REASON FOR CONSULT: This is a 34 year old woman with a history of schizoaffective disorder who presented to the Emergency Room under involuntary commitment because of agitation and losing her temper at her group home.   CHIEF COMPLAINT: "I lost my temper."   HISTORY OF PRESENT ILLNESS: Information obtained from the patient and the chart. The patient reports that she was riding in a vehicle today with workers from her group home. She got into some kind of argument with them. It sounds like it had something to do with her being agitated about her intrauterine device, possibly something psychotic. In any case, it escalated to the point that she got pretty angry, although it does not sound like she actually hurt anyone. When they got back to her group home, it sounds like the fight continued with other members of the group home. The patient very superficially cut herself on the left arm, but then smashed a mirror in her bedroom and smashed up a bunch of other property at the house. The patient tells me that she has a lot on her mind. Mostly it is the same complaints noted before, that she misses her family, especially her daughter and her mother, and she feels lonely at this group home. She denies that she has been having any hallucinations. She does have some odd thoughts about her body and her IUD and seems to feel a little bit paranoid, but it is kind of nonspecific. She says that she has been compliant with all of her medication. She denies that she abuses any substances.   PAST PSYCHIATRIC HISTORY: The patient has been residing in [place] for a little over a year. She has been seen in our Emergency Room before and had consults done, but had not been admitted here  before. [place] has [place] multiple admissions in the past in [place] and in [place]. Longstanding diagnosis of schizoaffective disorder. Sees Dr. Marguerite OleaMoffett for outpatient treatment. Has a history of suicide attempts, self-mutilation, and some agitation.   PAST MEDICAL HISTORY: History of self-mutilation, but nothing too severe. She has a history of gastric reflux, seasonal allergies, probable anemia, constipation, possible herpes, although I am not sure. I am just guessing from her ganciclovir, she did not tell me.     SOCIAL HISTORY:  The patient is incompetent. I am not sure who her guardian is. She resides in [place]. She has a 34-year-old daughter who lives with the patient's mother back in the [place] area.   SUBSTANCE ABUSE HISTORY: She denies any history of substance abuse. Do not have any information to contradict that.   CURRENT MEDICATIONS: Ganciclovir 500 mg twice a day, Topamax 200 mg twice a day, sucralfate 1 gram 3 times a day as needed for stomach upset, Senokot 2 tablets in the morning, olanzapine 20 mg at night, ferrous sulfate 325 mg a day, Depakote 500 mg twice a day, clonazepam 1 mg twice a day as needed for anxiety, citalopram 20 mg a day.   ALLERGIES: No known drug allergies.   REVIEW OF SYSTEMS: Still feeling a little agitated and angry, but denies suicidal or homicidal ideation. Not having any acute pain. No complaints of GI, pulmonary, or cardiac complaints, not feeling sick. Full review of systems  negative. Denies hallucinations.   MENTAL STATUS EXAMINATION: A somewhat dramatically and oddly made up, but clean woman who looks her stated age. Quietly cooperative with the interview. Eye contact diminished. Psychomotor activity slow. Speech quiet, but easy to understand. Affect blunted. Mood stated as being still irritated. Thoughts are slow, generally lucid. She did make some slightly paranoid and odd statements about her IUD, but denied  hallucinations. Denied suicidal or homicidal ideation. She is alert and oriented x 4. She can remember 3 out of 3 objects immediately and at 3 minutes. Long-term memory appears to be intact. Seems to have some chronic deficits in judgment at least.   VITAL SIGNS: Most recent blood pressure 114/81, respirations 18, pulse 73, temperature 98.   LABORATORY RESULTS: Pregnancy test negative. Chemistry shows chloride slightly elevated at 112. Potassium low at 3.4. Alcohol negative. Drug screen negative. CBC unremarkable, not anemic right now. Urinalysis normal.   ASSESSMENT: A 34 year old woman with schizoaffective disorder. Lost her temper at the group home. Still kind of irritated. It sounds like she smashed things up pretty good over there. Unclear, if she will be able to return. Based on psychosis and history of violence and self-injury, warrants hospitalization to be stabilized.   TREATMENT PLAN: Admit the patient to behavioral health. Suicide precautions in place. Continue current medicines. Supportive and educational therapy and explained treatment plan to patient.   DIAGNOSIS, PRINCIPAL AND PRIMARY:  AXIS I: Schizoaffective disorder, bipolar type, mixed.  SECONDARY DIAGNOSES: AXIS I: No further.  AXIS II: No diagnosis. AXIS III: Gastric reflux symptoms.   ____________________________ Audery Amel, MD jtc:LT D: 08/05/2014 21:45:25 ET T: 08/05/2014 22:03:27 ET JOB#: [id number]  cc: Audery Amel, MD, <Dictator> Audery Amel MD ELECTRONICALLY SIGNED 08/15/2014 17:01

## 2015-03-28 NOTE — Consult Note (Signed)
Brief Consult Note: Diagnosis: schizoaffective disorder.   Patient was seen by consultant.   Consult note dictated.   Recommend further assessment or treatment.   Orders entered.   Discussed with Attending MD.   Comments: Psychiatry: Patient seen an dchart reviewed. Patient with schizoaffective disorder became agitated and aggressive at group home. [facility]. Orders done and full note dictated.  Electronic Signatures: Audery Amellapacs, John T (MD)  (Signed 01-Sep-15 21:47)  Authored: Brief Consult Note   Last Updated: 01-Sep-15 21:47 by Audery Amellapacs, John T (MD)

## 2015-03-28 NOTE — Consult Note (Signed)
Brief Consult Note: Diagnosis: schizophrenia/Mild MR.   Patient was seen by consultant.   Consult note dictated.   Discussed with Attending MD.   Comments: Psychiatry: PAtient seen and chart reviewed. PAtient with chronic mental illness acting out at group home. Now calm and denies any intent to harm  self or others. Has good outpt followup. Supportive therapy and encourage better copipng skills. Will dc the patient IVC and can return to group home. See full note.  Electronic Signatures: Leonel Mccollum, JAudery Amelohn T (MD)  (Signed 12-Nov-15 17:04)  Authored: Brief Consult Note   Last Updated: 12-Nov-15 17:04 by Audery Amellapacs, Makeila Yamaguchi T (MD)

## 2015-03-28 NOTE — Consult Note (Signed)
PATIENT NAME:  Krista [name], Krista [name] MR#:  [id number] DATE OF BIRTH:  04/01/1981  DATE OF ADMISSION: 03/28/2014  DATE OF CONSULT:  03/29/2014  REQUESTING PHYSICIAN:  Dr. Sharyn Creamer  CONSULTING PHYSICIAN:  Jolanta B. Pucilowska, MD  REASON FOR CONSULTATION: To evaluate a suicidal patient.   IDENTIFYING DATA: Krista [name] is a 34 year old female with history of schizoaffective disorder.   CHIEF COMPLAINT: "I miss my mother and my daughter."   HISTORY OF PRESENT ILLNESS: Krista [name] has a long history of schizoaffective disorder. She is originally from [place]. She was placed at the group home in [place]. She has been there for 6 months. She misses her family. She does well on the current combination of medicines, but when under stress she sometimes cuts. The last time she cut her wrist was several years ago when she was upset with her boyfriend, the father of her child. Yesterday she started scratching her wrists with her fingers superficially. Her caregivers did not feel comfortable to leave her at home, especially in this particular group home, [facility]. The patients are left unattended, unsupervised for periods of time, so Teryl Lucy, the group home worker, brought the patient to the hospital for [place] to babysit. The patient is satisfied with the services provided by the group home, but she feels homesick and would like to be closer to home so she could see her child. She now believes that her mother placed her so far away so she could "steal her baby". The patient has a guardian, this is [facility] Her guardian's number, [phone], is disconnected. Therefore, we were unable to make the guardian aware that the patient is being discharged to home. The patient denies any symptoms of depression, anxiety or psychosis. She denies symptoms suggestive of bipolar mania. There is no alcohol, illicit drugs or prescription pill abuse.   PAST PSYCHIATRIC HISTORY: The patient had several  hospitalizations in [facility]. She says that she has not been in the hospital for many months. She thinks that the medications are working, and she is taking them as prescribed. She had a serious suicide attempt by cutting, although she explains that it was more of an accident when she was arguing with her boyfriend, the father of the baby. She hit the window and stitches had to be put in her wrist. There are some other older superficial cuts on her forearms, for which she used a knife. She denies substance abuse.   FAMILY PSYCHIATRIC HISTORY: None reported.   PAST MEDICAL HISTORY: Constipation and anemia.  MEDICATIONS ON ADMISSION: Klonopin 1 mg twice daily as needed, Depakote 500 mg twice daily, Topamax 200 mg twice daily, Celexa 20 mg daily, olanzapine 20 mg at bedtime, valacyclovir 500 mg twice daily, ferrous sulfate 325 mg daily, senna 8.6 mg 2 times daily.   ALLERGIES: No known drug allergies.   SOCIAL HISTORY: She is an incompetent adult. She is originally from [place], where her mother and daughter still reside. She is a resident of [facility]. She goes to [facility] for a day program, where she has a boyfriend. She would like to see him every day, and misses him, particularly on the weekend. She would like to return to her group home so she can participate in church tomorrow.   REVIEW OF SYSTEMS:    CONSTITUTIONAL: No fever or chills. No weight changes.  EYES: No double or blurred vision.  ENT: No hearing loss.  RESPIRATORY: No shortness of breath or cough.  CARDIOVASCULAR: No chest pain or orthopnea.  GASTROINTESTINAL: No abdominal pain, nausea, vomiting, or diarrhea.  GENITOURINARY: No incontinence or frequency.  ENDOCRINE: No heat or cold intolerance.  LYMPHATIC: No anemia or easy bruising.  INTEGUMENTARY: No acne or rash.  MUSCULOSKELETAL: No muscle or joint pain.  NEUROLOGIC: No tingling or weakness.  PSYCHIATRIC: See history of present illness  for details.   PHYSICAL EXAMINATION: VITAL SIGNS: Blood pressure 101/73, pulse 65, respirations 17, temperature 98.  GENERAL: This is a young, slender female in no acute distress.  The rest of the physical examination is deferred to her primary attending.   LABORATORY DATA: Chemistries are within normal limits. Blood alcohol level is zero. LFTs within normal limits. Urine tox screen is negative for substances. CBC within normal limits. Urinalysis is not suggestive of urinary tract infection. Serum acetaminophen and salicylates are low. Urine pregnancy test is negative.   MENTAL STATUS EXAMINATION: The patient is alert and oriented to person, place, time and situation. She is pleasant, polite and cooperative. She is a beautiful woman looking younger than stated age. Her wig is slightly out of shape. She is wearing hospital scrubs and a yellow shirt. She maintains good eye contact. Her speech is soft. Mood is fine, with a full affect. Thought process is logical and goal oriented. Thought content: She denies suicidal or homicidal ideation. There are no delusions or paranoia. There are no auditory or visual hallucinations. Her cognition seems grossly intact. Her registration, recall, short and long-term memory seem normal. She is of average intelligence and average fund of knowledge. Her insight and judgment are fair.   DIAGNOSES: AXIS I: Schizoaffective disorder, bipolar type.  AXIS II: Deferred. AXIS III: Constipation, anemia, herpes.  AXIS IV: Mental illness, primary support.  AXIS V: GAF 55.   PLAN:  1.  The patient does not meet criteria for involuntary inpatient psychiatric commitment. I will terminate proceedings. Please discharge as appropriate.   2.  She is to continue all medications as prescribed [facility]. No prescriptions necessary. [facility]. Staff from the group home will pick her up.     ____________________________ Braulio ConteJolanta B.  Jennet MaduroPucilowska, MD jbp:mr D: 03/29/2014 17:30:00 ET T: 03/29/2014 22:19:03 ET JOB#: [id number]  cc: Jolanta B. Jennet MaduroPucilowska, MD, <Dictator> Shari ProwsJOLANTA B PUCILOWSKA MD ELECTRONICALLY SIGNED 04/02/2014 23:51

## 2015-04-14 NOTE — H&P (Signed)
PATIENT NAME:  Krista [name], Krista [name] [id number] OF BIRTH:  Nov 13, 1981 OF ADMISSION:  08/05/2014 INFORMATION: This is a 34 year old woman with a history of schizoaffective disorder who presented to the Emergency Room under involuntary commitment because of agitation and losing her temper at her group home.  COMPLAINT: "I lost my temper."  OF PRESENT ILLNESS: The patient reports that she was riding in a vehicle with workers from her group home. Patient started complaining about her IUD because she believes the IUD is causing her to gain weight and it is making her feel as if her abdomen and breast were swollen.  Patient requested to be taken to the doctor to check if IUD was placed correctly.  The staff members remained Mrs. Taira that an [place] recently done showed that IUD was in the uterus. Patient felt the [facility] staff was not listening to her and this made her angry. While at the [facility] the patient?s anger escalated and she started throwing things, and yelling.  Patient then cut her arm superficially with a butter knife and smashed a mirror in her bedroom.  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  She has been living in this [facility] for about 1 year.  Patient states she has been complainant wither medications.  Today denies SI, HI or A/V H.  Mood is "regretful".  Patient would like to return to the [facility] but is unsure if they will take her back.  She tells me this has happened multiple times before as she has this episodes of agitation often.  Patient denies history of assault or aggression to others. ABUSE HISTORY: She denies any history of substance abuse.  PSYCHIATRIC HISTORY: The patient has been residing in a group home here in [facility] for a little over a year. She has been seen in our Emergency Room before and had consults done, but had not been admitted here before. She has had multiple admissions in the past in [place] and in [place]. Longstanding diagnosis of schizoaffective disorder. Sees Dr. Jacqualine Code  for outpatient treatment. Has a history of suicide attempts, self-mutilation, and some agitation.  MEDICAL HISTORY: She has a history of gastric reflux, seasonal allergies, probable anemia, constipation and  herpes  HISTORY:  Patient receives disability since her early 20?s.  She has a guardian [employer] in [place] [phone]. Education: high school diploma and special aid trough school.  Legal: past charges for destruction of property which were dropped.  Patient has a 68 y/o daughter who lives in [place] with the patient?s parents.     MEDICATIONS: Ganciclovir 500 mg twice a day, Topamax 200 mg twice a day, sucralfate 1 gram 3 times a day as needed for stomach upset, Senokot 2 tablets in the morning, olanzapine 20 mg at night, ferrous sulfate 325 mg a day, Depakote 500 mg twice a day, clonazepam 1 mg twice a day as needed for anxiety, citalopram 20 mg a day.  No known drug allergies.    MENTAL STATUS EXAMINATION: MENTAL STATUS EXAMINATION:young AAF wearing, long artificial eye lashes, patients has black eye liner spread all over her eye lidspleasant and cooperative CONTACT:  good   eye contactregular tone, volume and rateACTIVITY: wnlPROCESS:concreteCONTENT: - for SI, HI or A/V Heuthymiccongruent OF SYSTEMS:no weight loss, fever, chills, weakness or fatigue.no visual loos, blurred vision, double vision or yellow sclerae.  Ears, nose and throat: no hearing loos, sneezing, congestion, runny nose or sore throat.no rash or itchingno chest pain, chest pressure or discomfort.  No palpitations or edema.no shortness  of breath, cough or sputum.no anorexia, nausea, vomiting or diarrhea. No abdominal pain or blood.no burning on urination.no muscle, back or joint pain/stiffness.no anemia, bleeding or bruising.no enlarged nodes. No h/o splenectomy.see history of present illness.c/o headache, no dizziness, syncope, paralysis, ataxia, numbness or tingling in extremities.  No change in bowel or bladder  control.no reports of sweating, cold or heat intolerance.  No polyuria or poly- dipsia.no history of asthma, hives, eczema or rhinitis.  SIGNS:    02-Sep-15 07:37  .?Vital Signs Type: Routine  .?Temperature Temperature (F): 98.8  .?Celsius: 37.1  .?Pulse Pulse: 79  .?Respirations Respirations: 18  .?Systolic BP Systolic BP: 431  .?Diastolic BP (mmHg) Diastolic BP (mmHg): 68  .?Systolic BP Systolic BP: 540  .?Diastolic BP (mmHg) Diastolic BP (mmHg): 75   PHYSICAL EXAMINATION (per ED): APPEARANCE: The patient is alert, oriented.  Patient is in no acute distress. Head is normocephalic.  Pupils are equal and reactive. Supple without lymphadenopathy. Regular rate and rhythm. normal breath sounds. No crackles or wheezes are heard. Soft, nontender, nondistended with good bowel sounds heard. Without cyanosis, clubbing or edema.  Superficial cuts to left wrist NEUROLOGICAL: Gross nonfocal.    Labs:  Lab Results: Hepatic:  01-Sep-15 18:58   Bilirubin, Total 0.2  Alkaline Phosphatase 47 (46-116New Reference Range  SGPT (ALT) 19 (14-63New Reference Range  SGOT (AST) 16  Total Protein, Serum 7.8  Albumin, Serum 3.8  Routine Chem:  01-Sep-15 18:58   Glucose, Serum 88  BUN 8  Creatinine (comp) 0.73  Sodium, Serum 138  Potassium, Serum  3.4  Chloride, Serum  112  CO2, Serum  17  Calcium (Total), Serum 8.6  Osmolality (calc) 273  eGFR (African American) >60  eGFR (Non-African American) >60 (eGFR values <93m/min/1.73 m2 may be an indication of chronicdisease (CKD).eGFR is useful in patients with stable renal function.eGFR calculation will not be reliable in acutely ill patientsserum creatinine is changing rapidly. It is not useful in on dialysis. The eGFR calculation may not be applicablepatients at the low and high extremes of body sizes, pregnantand vegetarians.)  Anion Gap 9  Ethanol, S. < 3 (Result(s) reported on 05 Aug 2014 at 07:54PM.)  Urine Drugs:  [id number]-QPY-[id number]  Tricyclic  Antidepressant, Ur Qual (comp) NEGATIVE (Result(s) reported on 05 Aug 2014 at 07:49PM.)  Amphetamines, Urine Qual. NEGATIVE  MDMA, Urine Qual. NEGATIVE  Cocaine Metabolite, Urine Qual. NEGATIVE  Opiate, Urine qual NEGATIVE  Phencyclidine, Urine Qual. NEGATIVE  Cannabinoid, Urine Qual. NEGATIVE  Barbiturates, Urine Qual. NEGATIVE  Benzodiazepine, Urine Qual. NEGATIVE (-----------------URINE DRUG SCREEN provides only a preliminary, unconfirmedtest result and should not be used for non-medical  Clinical consideration and professional judgment should be to any positive drug screen result due to possiblesubstances.  A more specific alternate chemical methodbe used in order to obtain a confirmed analytical result.  Gasspectrometry (GC/MS) is the preferredmethod.)  Methadone, Urine Qual. NEGATIVE  Routine UA:  01-Sep-15 18:58   Color (UA) Yellow  Clarity (UA) Clear  Glucose (UA) Negative  Bilirubin (UA) Negative  Ketones (UA) Negative  Specific Gravity (UA) 1.009  Blood (UA) Negative  pH (UA) 7.0  Protein (UA) Negative  Nitrite (UA) Negative  Leukocyte Esterase (UA) Negative (Result(s) reported on 05 Aug 2014 at 07:53PM.)  RBC (UA) <1 /HPF  WBC (UA) <1 /HPF  Bacteria (UA) NONE SEEN  Epithelial Cells (UA) 1 /HPF  Mucous (UA) PRESENT (Result(s) reported on 05 Aug 2014 at 07:53PM.)  Routine Sero:  01-Sep-15 20:33   Pregnancy Test, Urine  NEGATIVE (The results of the qualitative urine HCG (Pregnancy Test) should bein light of other clinical information.  There areto the test which, in certain clinical situations, mayin a false positive or negative result.dose hook effect can occur in urine samples with extremelyHCG concentrations.  This effect can produce a negative resultcertain situations.is suggested that results of the qualitative HCG be confirmed byalternate methodology, such as the quantitative serum beta HCG  Routine Hem:  01-Sep-15 18:58   WBC (CBC) 6.0  RBC (CBC) 4.58  Hemoglobin  (CBC) 12.1  Hematocrit (CBC) 37.5  Platelet Count (CBC) 159 (Result(s) reported on 05 Aug 2014 at 07:58PM.)  MCV 82  MCH 26.5  MCHC 32.4  RDW  15.2   PLAN: Admit the patient to behavioral health. Suicide precautions in place. Continue current medicines. Supportive and educational therapy and explained treatment plan to patient.   I: Schizoaffective disorder, bipolar type, mixed.II: mild intellectual disability III: Gastric reflux symptoms, herpesIV: relational problems with GH staffV:30  PLAN: Admit the patient to behavioral health. Suicide precautions in place. Continue current medicines:  Schizoaffective d/o:20 mg at night500 mg twice a day1 mg twice a day as needed for anxiety20 mg/d for mood and aggression200 mg twice a day for weight losssucralfate 1 gram 3 times a day as needed for stomach upset, Senokot 2 tablets in the morning, ferrous sulfate 325 mg a dayGanciclovir 500 mg twice a daycontact psychiatrist for collateral:  Is pt a candidate for clozaril due to chronic aggression and psychosis? Multiple failed placements due to similar behavior.    Electronic Signatures: Hildred Priest (MD)  (Signed on 02-Sep-15 12:22)  Authored  Last Updated: 02-Sep-15 12:22 by Hildred Priest (MD)

## 2015-05-28 ENCOUNTER — Encounter: Payer: Self-pay | Admitting: *Deleted

## 2015-05-28 ENCOUNTER — Emergency Department
Admission: EM | Admit: 2015-05-28 | Discharge: 2015-05-29 | Disposition: A | Discharge status: Home / Self Care | Payer: Medicaid Other | Attending: Student | Admitting: Student

## 2015-05-28 DIAGNOSIS — Y998 Other external cause status: Secondary | ICD-10-CM | POA: Diagnosis not present

## 2015-05-28 DIAGNOSIS — X788XXA Intentional self-harm by other sharp object, initial encounter: Secondary | ICD-10-CM | POA: Insufficient documentation

## 2015-05-28 DIAGNOSIS — S50812A Abrasion of left forearm, initial encounter: Secondary | ICD-10-CM | POA: Insufficient documentation

## 2015-05-28 DIAGNOSIS — F209 Schizophrenia, unspecified: Secondary | ICD-10-CM

## 2015-05-28 DIAGNOSIS — Y9389 Activity, other specified: Secondary | ICD-10-CM | POA: Insufficient documentation

## 2015-05-28 DIAGNOSIS — Z3202 Encounter for pregnancy test, result negative: Secondary | ICD-10-CM | POA: Insufficient documentation

## 2015-05-28 DIAGNOSIS — Z7289 Other problems related to lifestyle: Secondary | ICD-10-CM

## 2015-05-28 DIAGNOSIS — Y9289 Other specified places as the place of occurrence of the external cause: Secondary | ICD-10-CM | POA: Insufficient documentation

## 2015-05-28 DIAGNOSIS — R4689 Other symptoms and signs involving appearance and behavior: Secondary | ICD-10-CM

## 2015-05-28 DIAGNOSIS — R45851 Suicidal ideations: Secondary | ICD-10-CM | POA: Diagnosis present

## 2015-05-28 DIAGNOSIS — F919 Conduct disorder, unspecified: Secondary | ICD-10-CM | POA: Diagnosis not present

## 2015-05-28 DIAGNOSIS — F79 Unspecified intellectual disabilities: Secondary | ICD-10-CM

## 2015-05-28 DIAGNOSIS — Z79899 Other long term (current) drug therapy: Secondary | ICD-10-CM | POA: Diagnosis not present

## 2015-05-28 LAB — COMPREHENSIVE METABOLIC PANEL
ALBUMIN: 4 g/dL (ref 3.5–5.0)
ALK PHOS: 42 U/L (ref 38–126)
ALT: 15 U/L (ref 14–54)
AST: 25 U/L (ref 15–41)
Anion gap: 12 (ref 5–15)
BUN: 9 mg/dL (ref 6–20)
CALCIUM: 9 mg/dL (ref 8.9–10.3)
CO2: 23 mmol/L (ref 22–32)
Chloride: 102 mmol/L (ref 101–111)
Creatinine, Ser: 0.58 mg/dL (ref 0.44–1.00)
GFR calc Af Amer: >60 mL/min (ref >60)
GFR calc non Af Amer: >60 mL/min (ref >60)
Glucose, Bld: 83 mg/dL (ref 65–99)
Potassium: 3.7 mmol/L (ref 3.5–5.1)
SODIUM: 137 mmol/L (ref 135–145)
TOTAL PROTEIN: 7.4 g/dL (ref 6.5–8.1)
Total Bilirubin: 0.5 mg/dL (ref 0.3–1.2)

## 2015-05-28 LAB — URINALYSIS COMPLETE WITH MICROSCOPIC (ARMC ONLY)
Bilirubin Urine: NEGATIVE
Glucose, UA: NEGATIVE mg/dL
HGB URINE DIPSTICK: NEGATIVE
Ketones, ur: NEGATIVE mg/dL
Leukocytes, UA: NEGATIVE
NITRITE: NEGATIVE
PROTEIN: NEGATIVE mg/dL
Specific Gravity, Urine: 1.002 — ABNORMAL LOW (ref 1.005–1.030)
pH: 7 (ref 5.0–8.0)

## 2015-05-28 LAB — SALICYLATE LEVEL: Salicylate Lvl: <4 mg/dL (ref 2.8–30.0)

## 2015-05-28 LAB — URINE DRUG SCREEN, QUALITATIVE (ARMC ONLY)
Amphetamines, Ur Screen: NOT DETECTED
BARBITURATES, UR SCREEN: NOT DETECTED
Benzodiazepine, Ur Scrn: NOT DETECTED
Cannabinoid 50 Ng, Ur ~~LOC~~: NOT DETECTED
Cocaine Metabolite,Ur ~~LOC~~: NOT DETECTED
MDMA (ECSTASY) UR SCREEN: NOT DETECTED
Methadone Scn, Ur: NOT DETECTED
OPIATE, UR SCREEN: NOT DETECTED
Phencyclidine (PCP) Ur S: NOT DETECTED
TRICYCLIC, UR SCREEN: NOT DETECTED

## 2015-05-28 LAB — CBC WITH DIFFERENTIAL/PLATELET
BASOS ABS: 0 10*3/uL (ref 0–0.1)
Basophils Relative: 0 %
EOS ABS: 0 10*3/uL (ref 0–0.7)
Eosinophils Relative: 1 %
HEMATOCRIT: 36.8 % (ref 35.0–47.0)
Hemoglobin: 11.6 g/dL — ABNORMAL LOW (ref 12.0–16.0)
LYMPHS PCT: 60 %
Lymphs Abs: 2.9 10*3/uL (ref 1.0–3.6)
MCH: 25.8 pg — ABNORMAL LOW (ref 26.0–34.0)
MCHC: 31.5 g/dL — ABNORMAL LOW (ref 32.0–36.0)
MCV: 82.1 fL (ref 80.0–100.0)
MONO ABS: 0.3 10*3/uL (ref 0.2–0.9)
Monocytes Relative: 6 %
Neutro Abs: 1.6 10*3/uL (ref 1.4–6.5)
Neutrophils Relative %: 33 %
Platelets: 131 10*3/uL — ABNORMAL LOW (ref 150–440)
RBC: 4.49 MIL/uL (ref 3.80–5.20)
RDW: 15.5 % — ABNORMAL HIGH (ref 11.5–14.5)
WBC: 4.9 10*3/uL (ref 3.6–11.0)

## 2015-05-28 LAB — ETHANOL

## 2015-05-28 LAB — PREGNANCY, URINE: Preg Test, Ur: NEGATIVE

## 2015-05-28 LAB — POCT PREGNANCY, URINE: Preg Test, Ur: NEGATIVE

## 2015-05-28 LAB — ACETAMINOPHEN LEVEL: Acetaminophen (Tylenol), Serum: <10 ug/mL — ABNORMAL LOW (ref 10–30)

## 2015-05-28 MED ORDER — LORAZEPAM 1 MG PO TABS
ORAL_TABLET | ORAL | Status: AC
  Filled 2015-05-28: qty 1

## 2015-05-28 NOTE — ED Notes (Signed)
BEHAVIORAL HEALTH ROUNDING Patient sleeping: No. Patient alert and oriented: yes Behavior appropriate: Yes.  ; If no, describe:  Nutrition and fluids offered: Yes  Toileting and hygiene offered: Yes  Sitter present: no Law enforcement present: Yes  

## 2015-05-28 NOTE — ED Notes (Addendum)
Pt is from a group home, reports being suicidal attempted to cut self last night, pt lost temper at group home, pt got aggressive and threw a chair, pt than called police, pt is heree voluntary, pt denies HI, pt reports staff " being mean and aggressive", pt denies SI during triage assessment

## 2015-05-28 NOTE — ED Provider Notes (Signed)
[facility] Emergency Department Provider Note  ____________________________________________  Time seen: Approximately 5:54 PM  I have reviewed the triage vital signs and the nursing notes.   HISTORY  Chief Complaint Suicidal    HPI Krista [name] is a 34 y.o. female with history of schizophrenia, mental retardation who presents for evaluation for intermittent aggression at group home which occurred suddenly today, now resolved. Patient reports that staff the group home "were picking at me". She got upset and threw a chair. She also reports that she had some cutting behavior last night but she was not trying to kill herself. She has no suicidal intent. No homicidal intent. She reports that she was just upset. She has no audiovisual hallucinations. She has recently been in her usual state of health without illness, no cough, sneezing, runny nose, congestion, vomiting, diarrhea. She has no acute medical complaints. No modifying factors.    Past Medical History  Diagnosis Date  . Schizophrenia   . Depression   . Mental retardation     There are no active problems to display for this patient.   History reviewed. No pertinent past surgical history.  Current Outpatient Rx  Name  Route  Sig  Dispense  Refill  . citalopram (CELEXA) 20 MG tablet   Oral   Take 20 mg by mouth daily.         . citalopram (CELEXA) 20 MG tablet   Oral   Take 1 tablet (20 mg total) by mouth daily.   7 tablet   0   . clonazePAM (KLONOPIN) 1 MG tablet   Oral   Take 1 mg by mouth at bedtime.         . clonazePAM (KLONOPIN) 1 MG tablet   Oral   Take 1 tablet (1 mg total) by mouth at bedtime as needed for anxiety.   7 tablet   0   . divalproex (DEPAKOTE ER) 500 MG 24 hr tablet   Oral   Take 1,000 mg by mouth at bedtime.         . divalproex (DEPAKOTE) 500 MG DR tablet   Oral   Take 1 tablet (500 mg total) by mouth 3 (three) times daily.   7 tablet   0   .  ferrous sulfate 325 (65 FE) MG tablet   Oral   Take 325 mg by mouth daily.           Marland Kitchen OLANZapine (ZYPREXA) 20 MG tablet   Oral   Take 20 mg by mouth at bedtime.         Marland Kitchen OLANZapine (ZYPREXA) 20 MG tablet   Oral   Take 1 tablet (20 mg total) by mouth at bedtime.   7 tablet   0   . senna-docusate (SENOKOT-S) 8.6-50 MG per tablet   Oral   Take 2 tablets by mouth daily.           Marland Kitchen topiramate (TOPAMAX) 100 MG tablet   Oral   Take 200 mg by mouth at bedtime.         . topiramate (TOPAMAX) 100 MG tablet   Oral   Take 2 tablets (200 mg total) by mouth 2 (two) times daily.   7 tablet   0   . valACYclovir (VALTREX) 500 MG tablet   Oral   Take 500 mg by mouth 2 (two) times daily.         . valACYclovir (VALTREX) 500 MG tablet   Oral  Take 1 tablet (500 mg total) by mouth 2 (two) times daily.   14 tablet   0     Allergies Review of patient's allergies indicates no known allergies.  No family history on file.  Social History History  Substance Use Topics  . Smoking status: Never Smoker   . Smokeless tobacco: Not on file  . Alcohol Use: No    Review of Systems Constitutional: No fever/chills Eyes: No visual changes. ENT: No sore throat. Cardiovascular: Denies chest pain. Respiratory: Denies shortness of breath. Gastrointestinal: No abdominal pain.  No nausea, no vomiting.  No diarrhea.  No constipation. Genitourinary: Negative for dysuria. Musculoskeletal: Negative for back pain. Skin: Negative for rash. Neurological: Negative for headaches, focal weakness or numbness.  10-point ROS otherwise negative.  ____________________________________________   PHYSICAL EXAM:  VITAL SIGNS: ED Triage Vitals  Enc Vitals Group     BP 05/28/15 1719 99/82 mmHg     Pulse Rate 05/28/15 1719 51     Resp --      Temp 05/28/15 1719 98.1 F (36.7 C)     Temp Source 05/28/15 1719 Oral     SpO2 05/28/15 1719 100 %     Weight 05/28/15 1719 155 lb (70.308 kg)      Height 05/28/15 1719  (1.626 m)     Head Cir --      Peak Flow --      Pain Score --      Pain Loc --      Pain Edu? --      Excl. in GC? --     Constitutional: Alert and oriented. Well appearing and in no acute distress. Eyes: Conjunctivae are normal. EOMI. Head: Atraumatic. Nose: No congestion/rhinnorhea. Mouth/Throat: Mucous membranes are moist.  Oropharynx non-erythematous. Neck: No stridor.  Cardiovascular: Normal rate, regular rhythm. Grossly normal heart sounds.  Good peripheral circulation. Respiratory: Normal respiratory effort.  No retractions. Lungs CTAB. Gastrointestinal: Soft and nontender. No distention. No abdominal bruits. No CVA tenderness. Genitourinary: deferred Musculoskeletal: No lower extremity tenderness nor edema.  No joint effusions. Small superficial cut to left forearm with overlying scab. Neurologic:  Normal speech and language. No gross focal neurologic deficits are appreciated. Speech is normal. No gait instability. Skin:  Skin is warm, dry and intact. No rash noted. Psychiatric: Mood and affect are normal. Speech and behavior are normal.  ____________________________________________   LABS (all labs ordered are listed, but only abnormal results are displayed)  Labs Reviewed  CBC WITH DIFFERENTIAL/PLATELET - Abnormal; Notable for the following:    Hemoglobin 11.6 (*)    MCH 25.8 (*)    MCHC 31.5 (*)    RDW 15.5 (*)    Platelets 131 (*)    All other components within normal limits  URINALYSIS COMPLETEWITH MICROSCOPIC ([facility] ONLY) - Abnormal; Notable for the following:    Color, Urine COLORLESS (*)    APPearance CLEAR (*)    Specific Gravity, Urine 1.002 (*)    Bacteria, UA RARE (*)    Squamous Epithelial / LPF 0-5 (*)    All other components within normal limits  COMPREHENSIVE METABOLIC PANEL  ETHANOL  URINE DRUG SCREEN, QUALITATIVE ([facility] ONLY)  PREGNANCY, URINE  ACETAMINOPHEN LEVEL  SALICYLATE LEVEL  POCT PREGNANCY, URINE    ____________________________________________  EKG  none ____________________________________________  RADIOLOGY  none ____________________________________________   PROCEDURES  Procedure(s) performed: None  Critical Care performed: No  ____________________________________________   INITIAL IMPRESSION / ASSESSMENT AND PLAN / ED COURSE  Pertinent  labs & imaging results that were available during my care of the patient were reviewed by me and considered in my medical decision making (see chart for details).  Krista [name] is a 34 y.o. female with history of schizophrenia, mental retardation who presents for evaluation for intermittent aggression at group home which occurred suddenly today, now resolved. On exam, she is nontoxic appearing, vital signs stable, she is afebrile. She has a benign physical examination other than a small healing laceration to the left forearm which is covered by scab. Her last tetanus shot was received within the past year. She has no suicidal ideation, no homicidal ideation, no audiovisual hallucinations. No indication for commitment. She has no acute medical complaints. Labs reviewed and notable for mild anemia but are otherwise unremarkable. We will observe in the ER, consult behavioral health and psychiatry.   ----------------------------------------- 7:43 PM on 05/28/2015 -----------------------------------------  Labs reviewed. Patient is medically cleared. ____________________________________________   FINAL CLINICAL IMPRESSION(S) / ED DIAGNOSES  Final diagnoses:  Aggression  Deliberate self-cutting      Gayla Doss, MD 05/28/15 2351

## 2015-05-28 NOTE — ED Notes (Signed)

## 2015-05-28 NOTE — ED Notes (Signed)

## 2015-05-28 NOTE — ED Notes (Signed)
Pt. Fearful she will not be allowed to return to group home.  Pt. States she tries to help out group home members and staff but is not appreciated  Pt. States she has trouble controlling her anger and will get frustrated and will act out.  Pt. Knows what she has to do to calm down but is not given opportunity at group home with some staff.

## 2015-05-29 DIAGNOSIS — F203 Undifferentiated schizophrenia: Secondary | ICD-10-CM

## 2015-05-29 DIAGNOSIS — F209 Schizophrenia, unspecified: Secondary | ICD-10-CM

## 2015-05-29 DIAGNOSIS — F79 Unspecified intellectual disabilities: Secondary | ICD-10-CM

## 2015-05-29 NOTE — Progress Notes (Signed)
Per Dr. Toni Amend patient does not meet criteria for inpatient hospitalization and will be discharged back to follow up with outpatient provider.    Call to [facility] [phone] or [phone], Spoke with staff person Teryl Lucy to inform her patient is ready to discharge back to the group home.   She will come to transport patient back to group home.   CSW informed RN   Sammuel Hines. Theresia Majors, MSW Clinical Social Work Department Emergency Room [phone] 4:28 PM

## 2015-05-29 NOTE — ED Notes (Signed)
BEHAVIORAL HEALTH ROUNDING Patient sleeping: No. Patient alert and oriented: yes Behavior appropriate: Yes.  ; If no, describe:  Nutrition and fluids offered: Yes  Toileting and hygiene offered: Yes  Sitter present: no Law enforcement present: Yes  

## 2015-05-29 NOTE — ED Notes (Signed)
BEHAVIORAL HEALTH ROUNDING  Patient sleeping: Yes.  Patient alert and oriented: no  Behavior appropriate: Yes. ; If no, describe:  Nutrition and fluids offered: No  Toileting and hygiene offered: No  Sitter present: no  Law enforcement present: Yes   

## 2015-05-29 NOTE — ED Notes (Signed)
BEHAVIORAL HEALTH ROUNDING Patient sleeping: Yes.   Patient alert and oriented: yes Behavior appropriate: Yes.  ;  Nutrition and fluids offered: Yes  Toileting and hygiene offered: Yes  Sitter present: yes Law enforcement present: Yes  

## 2015-05-29 NOTE — ED Notes (Signed)
BEHAVIORAL HEALTH ROUNDING Patient sleeping: Yes.   Patient alert and oriented: not applicable Behavior appropriate: Yes.  ; If no, describe:  Nutrition and fluids offered: No Toileting and hygiene offered: no Sitter present: yes Law enforcement present: Yes

## 2015-05-29 NOTE — BH Assessment (Signed)
Assessment Note  Krista [name] is an 34 y.o. female, who presents to the ED voluntarily for c/o, " got into a huge argument with the staff at the group home; they were on me about not respecting the staff; I make cards and paintings ; I'm confused; I don't understand; I had thoughts of cutting myself(has 2 small cuts on left arm); they were accussing me of this and that; I'm emotionally drained; "I'm depressed."   Axis I: Chronic Paranoid Schizophrenia and Generalized Anxiety Disorder Axis II: Deferred Axis III:  Past Medical History  Diagnosis Date  . Schizophrenia   . Depression   . Mental retardation    Axis IV: housing problems, other psychosocial or environmental problems, problems related to social environment and problems with primary support group Axis V: 41-50 serious symptoms    Past Medical History:  Past Medical History  Diagnosis Date  . Schizophrenia   . Depression   . Mental retardation     History reviewed. No pertinent past surgical history.  Family History: No family history on file.  Social History:  reports that she has never smoked. She does not have any smokeless tobacco history on file. She reports that she does not drink alcohol or use illicit drugs.  Additional Social History:     CIWA: CIWA-Ar BP: 99/82 mmHg Pulse Rate: (!) 51 COWS:    Allergies: No Known Allergies  Home Medications:  (Not in a hospital admission)  OB/GYN Status:  Patient's last menstrual period was 05/07/2015.  General Assessment Data Location of Assessment: [facility] ED TTS Assessment: In system Is this a Tele or Face-to-Face Assessment?: Face-to-Face Is this an Initial Assessment or a Re-assessment for this encounter?: Re-Assessment Marital status: Single Maiden name: none Is patient pregnant?: No Pregnancy Status: No Living Arrangements: Group Home Can pt return to current living arrangement?: Yes Admission Status: Voluntary Is patient capable of signing voluntary  admission?: Yes Referral Source: Self/Family/Friend Insurance type: Designer, industrial/product Exam [facility] Walk-in ONLY) Medical Exam completed: Yes  Crisis Care Plan Living Arrangements: Group Home Name of Psychiatrist: [facility] Name of Therapist: none  Education Status Is patient currently in school?: No Current Grade: n/a Highest grade of school patient has completed: 12th Name of school: n/a  Risk to self with the past 6 months Suicidal Ideation: No Has patient been a risk to self within the past 6 months prior to admission? : No Suicidal Intent: No Has patient had any suicidal intent within the past 6 months prior to admission? : No Is patient at risk for suicide?: Yes ("I had thought about cutting myself.") Suicidal Plan?: No Has patient had any suicidal plan within the past 6 months prior to admission? : No Access to Means: No What has been your use of drugs/alcohol within the last 12 months?: none Previous Attempts/Gestures: Yes (superficial cutting) How many times?: 1 Other Self Harm Risks: being depressed Triggers for Past Attempts: Other personal contacts Intentional Self Injurious Behavior: Cutting Comment - Self Injurious Behavior: superficial cutting x 1 Family Suicide History: No Recent stressful life event(s): Conflict (Comment) (argument with the staff at the group home) Persecutory voices/beliefs?: No Depression: Yes Depression Symptoms: Despondent Substance abuse history and/or treatment for substance abuse?: No Suicide prevention information given to non-admitted patients: Yes  Risk to Others within the past 6 months Homicidal Ideation: No Does patient have any lifetime risk of violence toward others beyond the six months prior to admission? : No Thoughts of Harm to Others: No  Current Homicidal Intent: No Current Homicidal Plan: No Access to Homicidal Means: No Identified Victim: none History of harm to others?: No Assessment of Violence: On  admission Violent Behavior Description: none Does patient have access to weapons?: No Criminal Charges Pending?: No Does patient have a court date: No Is patient on probation?: No  Psychosis Hallucinations: None noted Delusions: None noted  Mental Status Report Appearance/Hygiene: Excess makeup, In scrubs Eye Contact: Fair Motor Activity: Unremarkable Speech: Rapid Level of Consciousness: Alert Mood: Anxious, Angry Affect: Anxious Anxiety Level: Minimal Thought Processes: Circumstantial Judgement: Partial Orientation: Person, Place Obsessive Compulsive Thoughts/Behaviors: Moderate  Cognitive Functioning Concentration: Fair Memory: Recent Impaired Insight: Poor Impulse Control: Fair Appetite: Fair Weight Loss: 4 Weight Gain: 0 Sleep: No Change Total Hours of Sleep: 6 Vegetative Symptoms: None  ADLScreening [facility] Assessment Services) Patient's cognitive ability adequate to safely complete daily activities?: Yes Patient able to express need for assistance with ADLs?: Yes Independently performs ADLs?: Yes (appropriate for developmental age)     Prior Outpatient Therapy Prior Outpatient Therapy: Yes Prior Therapy Dates: quarterly Prior Therapy Facilty/Provider(s): [facility] Reason for Treatment: Schizophrenia Does patient have an ACCT team?: No Does patient have Intensive In-House Services?  : No Does patient have [facility] services? : No Does patient have P4CC services?: No  ADL Screening (condition at time of admission) Patient's cognitive ability adequate to safely complete daily activities?: Yes Patient able to express need for assistance with ADLs?: Yes Independently performs ADLs?: Yes (appropriate for developmental age)       Abuse/Neglect Assessment (Assessment to be complete while patient is alone) Physical Abuse: Denies Verbal Abuse: Denies Sexual Abuse: Denies Exploitation of patient/patient's resources: Denies Self-Neglect: Denies Values /  Beliefs Cultural Requests During Hospitalization: None Spiritual Requests During Hospitalization: None Consults Spiritual Care Consult Needed: No Social Work Consult Needed: No Merchant navy officer (For Healthcare) Does patient have an advance directive?: No Would patient like information on creating an advanced directive?: No - patient declined information    Additional Information 1:1 In Past 12 Months?: No CIRT Risk: No Elopement Risk: No Does patient have medical clearance?: Yes  Child/Adolescent Assessment Running Away Risk: Denies Bed-Wetting: Denies Destruction of Property: Denies Cruelty to Animals: Denies Stealing: Denies Rebellious/Defies Authority: Denies Satanic Involvement: Denies Archivist: Denies Problems at [employer]: Denies Gang Involvement: Denies  Disposition:  Disposition Initial Assessment Completed for this Encounter: Yes Disposition of Patient: Referred to Patient referred to:  Amada Jupiter MD to see)  On Site Evaluation by:   Reviewed with Physician:    Dwan Bolt 05/29/2015 1:10 AM

## 2015-05-29 NOTE — ED Notes (Signed)
BEHAVIORAL HEALTH ROUNDING Patient sleeping: Yes.   Patient alert and oriented: not applicable Behavior appropriate: Yes.  , pt sleeping Nutrition and fluids offered: Yes  Toileting and hygiene offered: Yes  Sitter present: yes Law enforcement present: Yes   ENVIRONMENTAL ASSESSMENT Potentially harmful objects out of patient reach: Yes.   Personal belongings secured: Yes.   Patient dressed in hospital provided attire only: Yes.   Plastic bags out of patient reach: Yes.   Patient care equipment (cords, cables, call bells, lines, and drains) shortened, removed, or accounted for: Yes.   Equipment and supplies removed from bottom of stretcher: Yes.   Potentially toxic materials out of patient reach: Yes.   Sharps container removed or out of patient reach: Yes.

## 2015-05-29 NOTE — ED Notes (Signed)
BEHAVIORAL HEALTH ROUNDING Patient sleeping: Yes.   Patient alert and oriented: not applicable Behavior appropriate: Yes.  ; If no, describe:  Nutrition and fluids offered: No Toileting and hygiene offered: no Sitter present: no Law enforcement present: Yes

## 2015-05-29 NOTE — Discharge Instructions (Signed)
Please seek medical attention and help for any thoughts about wanting to harm herself, harm others, any concerning change in behavior, severe depression, inappropriate drug use or any other new or concerning symptoms. ° °

## 2015-05-29 NOTE — Consult Note (Signed)
[facility] Consult   Reason for Consult:  Consult for this 34 year old woman with schizophrenia and mild intellectual impairment who came to the emergency room voluntarily Referring Physician:  Archie Balboa Patient Identification: Krista [name] MRN:  [id number] Principal Diagnosis: Schizophrenia Diagnosis:   Patient Active Problem List   Diagnosis Date Noted  . Schizophrenia [F20.9] 05/29/2015  . Mental retardation [F79] 05/29/2015    Total Time spent with patient: 1 hour  Subjective:   Krista [name] is a 34 y.o. female patient admitted with "I got mad at a staff". Patient reports that she's been feeling frustrated and anxious so she asked 911 to bring her over to the hospital..  HPI:  Information from the patient and the chart. Patient reports that yesterday she got frustrated with one of the staff members. She claims that a staff member was cussing at her and this got her upset. She went to her bedroom and scratched her left forearm. The scratches she shows me are so superficial or almost hard to see. Clearly was not intending to kill her self. She says that she felt anxious and upset so she called 911 and asked them to bring her to the hospital. She has chronic auditory hallucinations which have been about the same as usual. She denies that she's been noncompliant with her medicine. Denies any recent changes to her medicine. Denies any substance abuse. Currently she denies any suicidal or homicidal ideation. She reports that she feels like the staff at her group home sometimes expect too much of her and it makes her feel stressed out.  Past psychiatric history: Lifelong history of schizophrenia and mental retardation. Originally from [place]. Has been in multiple group homes. In the past as had a tendency to get aggressive and act out at times which is resulted in moving from one group home to another. She's been in this current one since 2012 and it's very important to  her that she not lose this opportunity. Currently compliant with anti-psychotic medicine.  Social history: Patient lives in a group home. Her family all live in [place]. Apparently she speaks to him on the phone at times but doesn't get to see them very often. She has a daughter who she fixates on and talks about all the time although I don't think she is probably seen her in years.  Family history: Denies knowing of any  Substance abuse history: No known history of alcohol or drug problems ever  Medical history: She reports that she has diabetes but knows of no other medical problems. HPI Elements:   Quality:  Irritability and mood swings. Severity:  Moderate. Timing:  Mostly in response to acute stresses like being yelled at by staff members. Duration:  Intermittent and brief for the most part. Context:  Chronic mental illness.  Past Medical History:  Past Medical History  Diagnosis Date  . Schizophrenia   . Depression   . Mental retardation    History reviewed. No pertinent past surgical history. Family History: No family history on file. Social History:  History  Alcohol Use No     History  Drug Use No    History   Social History  . Marital Status: Significant Other    Spouse Name: N/A  . Number of Children: N/A  . Years of Education: N/A   Social History Main Topics  . Smoking status: Never Smoker   . Smokeless tobacco: Not on file  . Alcohol Use: No  . Drug Use: No  .  Sexual Activity: Not on file   Other Topics Concern  . None   Social History Narrative   Additional Social History:                          Allergies:  No Known Allergies  Labs:  Results for orders placed or performed during the hospital encounter of 05/28/15 (from the past 48 hour(s))  Acetaminophen level     Status: Abnormal   Collection Time: 05/28/15  5:14 PM  Result Value Ref Range   Acetaminophen (Tylenol), Serum <10 (L) 10 - 30 ug/mL    Comment:        THERAPEUTIC  CONCENTRATIONS VARY SIGNIFICANTLY. A RANGE OF 10-30 ug/mL MAY BE AN EFFECTIVE CONCENTRATION FOR MANY PATIENTS. HOWEVER, SOME ARE BEST TREATED AT CONCENTRATIONS OUTSIDE THIS RANGE. ACETAMINOPHEN CONCENTRATIONS >150 ug/mL AT 4 HOURS AFTER INGESTION AND >50 ug/mL AT 12 HOURS AFTER INGESTION ARE OFTEN ASSOCIATED WITH TOXIC REACTIONS.   Salicylate level     Status: None   Collection Time: 05/28/15  5:14 PM  Result Value Ref Range   Salicylate Lvl <8.7 2.8 - 30.0 mg/dL  CBC WITH DIFFERENTIAL     Status: Abnormal   Collection Time: 05/28/15  5:16 PM  Result Value Ref Range   WBC 4.9 3.6 - 11.0 K/uL   RBC 4.49 3.80 - 5.20 MIL/uL   Hemoglobin 11.6 (L) 12.0 - 16.0 g/dL   HCT 36.8 35.0 - 47.0 %   MCV 82.1 80.0 - 100.0 fL   MCH 25.8 (L) 26.0 - 34.0 pg   MCHC 31.5 (L) 32.0 - 36.0 g/dL   RDW 15.5 (H) 11.5 - 14.5 %   Platelets 131 (L) 150 - 440 K/uL   Neutrophils Relative % 33 %   Neutro Abs 1.6 1.4 - 6.5 K/uL   Lymphocytes Relative 60 %   Lymphs Abs 2.9 1.0 - 3.6 K/uL   Monocytes Relative 6 %   Monocytes Absolute 0.3 0.2 - 0.9 K/uL   Eosinophils Relative 1 %   Eosinophils Absolute 0.0 0 - 0.7 K/uL   Basophils Relative 0 %   Basophils Absolute 0.0 0 - 0.1 K/uL  Comprehensive metabolic panel     Status: None   Collection Time: 05/28/15  5:16 PM  Result Value Ref Range   Sodium 137 135 - 145 mmol/L   Potassium 3.7 3.5 - 5.1 mmol/L   Chloride 102 101 - 111 mmol/L   CO2 23 22 - 32 mmol/L   Glucose, Bld 83 65 - 99 mg/dL   BUN 9 6 - 20 mg/dL   Creatinine, Ser 0.58 0.44 - 1.00 mg/dL   Calcium 9.0 8.9 - 10.3 mg/dL   Total Protein 7.4 6.5 - 8.1 g/dL   Albumin 4.0 3.5 - 5.0 g/dL   AST 25 15 - 41 U/L   ALT 15 14 - 54 U/L   Alkaline Phosphatase 42 38 - 126 U/L   Total Bilirubin 0.5 0.3 - 1.2 mg/dL   GFR calc non Af Amer >60 >60 mL/min   GFR calc Af Amer >60 >60 mL/min    Comment: (NOTE) The eGFR has been calculated using the CKD EPI equation. This calculation has not been validated  in all clinical situations. eGFR's persistently <60 mL/min signify possible Chronic Kidney Disease.    Anion gap 12 5 - 15  Ethanol     Status: None   Collection Time: 05/28/15  5:16 PM  Result Value Ref Range  Alcohol, Ethyl (B) <5 <5 mg/dL    Comment:        LOWEST DETECTABLE LIMIT FOR SERUM ALCOHOL IS 5 mg/dL FOR MEDICAL PURPOSES ONLY   Urine Drug Screen, Qualitative ([facility] only)     Status: None   Collection Time: 05/28/15  5:26 PM  Result Value Ref Range   Tricyclic, Ur Screen NONE DETECTED NONE DETECTED   Amphetamines, Ur Screen NONE DETECTED NONE DETECTED   MDMA (Ecstasy)Ur Screen NONE DETECTED NONE DETECTED   Cocaine Metabolite,Ur [place] NONE DETECTED NONE DETECTED   Opiate, Ur Screen NONE DETECTED NONE DETECTED   Phencyclidine (PCP) Ur S NONE DETECTED NONE DETECTED   Cannabinoid 50 Ng, Ur [place] NONE DETECTED NONE DETECTED   Barbiturates, Ur Screen NONE DETECTED NONE DETECTED   Benzodiazepine, Ur Scrn NONE DETECTED NONE DETECTED   Methadone Scn, Ur NONE DETECTED NONE DETECTED    Comment: (NOTE) 726  Tricyclics, urine               Cutoff 1000 ng/mL 200  Amphetamines, urine             Cutoff 1000 ng/mL 300  MDMA (Ecstasy), urine           Cutoff 500 ng/mL 400  Cocaine Metabolite, urine       Cutoff 300 ng/mL 500  Opiate, urine                   Cutoff 300 ng/mL 600  Phencyclidine (PCP), urine      Cutoff 25 ng/mL 700  Cannabinoid, urine              Cutoff 50 ng/mL 800  Barbiturates, urine             Cutoff 200 ng/mL 900  Benzodiazepine, urine           Cutoff 200 ng/mL 1000 Methadone, urine                Cutoff 300 ng/mL 1100 1200 The urine drug screen provides only a preliminary, unconfirmed 1300 analytical test result and should not be used for non-medical 1400 purposes. Clinical consideration and professional judgment should 1500 be applied to any positive drug screen result due to possible 1600 interfering substances. A more specific alternate chemical method 1700  must be used in order to obtain a confirmed analytical result.  1800 Gas chromato graphy / mass spectrometry (GC/MS) is the preferred 1900 confirmatory method.   Urinalysis complete, with microscopic ([facility] only)     Status: Abnormal   Collection Time: 05/28/15  5:26 PM  Result Value Ref Range   Color, Urine COLORLESS (A) YELLOW   APPearance CLEAR (A) CLEAR   Glucose, UA NEGATIVE NEGATIVE mg/dL   Bilirubin Urine NEGATIVE NEGATIVE   Ketones, ur NEGATIVE NEGATIVE mg/dL   Specific Gravity, Urine 1.002 (L) 1.005 - 1.030   Hgb urine dipstick NEGATIVE NEGATIVE   pH 7.0 5.0 - 8.0   Protein, ur NEGATIVE NEGATIVE mg/dL   Nitrite NEGATIVE NEGATIVE   Leukocytes, UA NEGATIVE NEGATIVE   RBC / HPF 0-5 0 - 5 RBC/hpf   WBC, UA 0-5 0 - 5 WBC/hpf   Bacteria, UA RARE (A) NONE SEEN   Squamous Epithelial / LPF 0-5 (A) NONE SEEN  Pregnancy, urine     Status: None   Collection Time: 05/28/15  5:26 PM  Result Value Ref Range   Preg Test, Ur NEGATIVE NEGATIVE  Pregnancy, urine POC     Status: None  Collection Time: 05/28/15  5:57 PM  Result Value Ref Range   Preg Test, Ur NEGATIVE NEGATIVE    Comment:        THE SENSITIVITY OF THIS METHODOLOGY IS >24 mIU/mL     Vitals: Blood pressure 99/82, pulse 51, temperature 98.1 F (36.7 C), temperature source Oral, height _0  (1.626 m), weight 70.308 kg (155 lb), last menstrual period 05/07/2015, SpO2 100 %.  Risk to Self: Suicidal Ideation: No Suicidal Intent: No Is patient at risk for suicide?: Yes ("I had thought about cutting myself.") Suicidal Plan?: No Access to Means: No What has been your use of drugs/alcohol within the last 12 months?: none How many times?: 1 Other Self Harm Risks: being depressed Triggers for Past Attempts: Other personal contacts Intentional Self Injurious Behavior: Cutting Comment - Self Injurious Behavior: superficial cutting x 1 Risk to Others: Homicidal Ideation: No Thoughts of Harm to Others: No Current Homicidal  Intent: No Current Homicidal Plan: No Access to Homicidal Means: No Identified Victim: none History of harm to others?: No Assessment of Violence: On admission Violent Behavior Description: none Does patient have access to weapons?: No Criminal Charges Pending?: No Does patient have a court date: No Prior Inpatient Therapy:   Prior Outpatient Therapy: Prior Outpatient Therapy: Yes Prior Therapy Dates: quarterly Prior Therapy Facilty/Provider(s): [facility] Reason for Treatment: Schizophrenia Does patient have an ACCT team?: No Does patient have Intensive In-House Services?  : No Does patient have [facility] services? : No Does patient have [facility] services?: No  No current facility-administered medications for this encounter.   Current Outpatient Prescriptions  Medication Sig Dispense Refill  . citalopram (CELEXA) 20 MG tablet Take 20 mg by mouth daily.    . citalopram (CELEXA) 20 MG tablet Take 1 tablet (20 mg total) by mouth daily. 7 tablet 0  . clonazePAM (KLONOPIN) 1 MG tablet Take 1 mg by mouth at bedtime.    . clonazePAM (KLONOPIN) 1 MG tablet Take 1 tablet (1 mg total) by mouth at bedtime as needed for anxiety. 7 tablet 0  . divalproex (DEPAKOTE ER) 500 MG 24 hr tablet Take 1,000 mg by mouth at bedtime.    . divalproex (DEPAKOTE) 500 MG DR tablet Take 1 tablet (500 mg total) by mouth 3 (three) times daily. 7 tablet 0  . ferrous sulfate 325 (65 FE) MG tablet Take 325 mg by mouth daily.      Marland Kitchen OLANZapine (ZYPREXA) 20 MG tablet Take 20 mg by mouth at bedtime.    Marland Kitchen OLANZapine (ZYPREXA) 20 MG tablet Take 1 tablet (20 mg total) by mouth at bedtime. 7 tablet 0  . senna-docusate (SENOKOT-S) 8.6-50 MG per tablet Take 2 tablets by mouth daily.      Marland Kitchen topiramate (TOPAMAX) 100 MG tablet Take 200 mg by mouth at bedtime.    . topiramate (TOPAMAX) 100 MG tablet Take 2 tablets (200 mg total) by mouth 2 (two) times daily. 7 tablet 0  . valACYclovir (VALTREX) 500 MG tablet Take 500 mg by mouth 2 (two)  times daily.    . valACYclovir (VALTREX) 500 MG tablet Take 1 tablet (500 mg total) by mouth 2 (two) times daily. 14 tablet 0    Musculoskeletal: Strength & Muscle Tone: within normal limits Gait & Station: normal Patient leans: N/A  Psychiatric Specialty Exam: Physical Exam  Constitutional: She appears well-developed and well-nourished.  HENT:  Head: Normocephalic and atraumatic.  Eyes: Conjunctivae are normal. Pupils are equal, round, and reactive to light.  Neck:  Normal range of motion.  Cardiovascular: Normal heart sounds.   Respiratory: Effort normal.  GI: Soft.  Musculoskeletal: Normal range of motion.  Neurological: She is alert.  Skin: Skin is warm and dry.  Psychiatric: Her behavior is normal. Thought content normal. Her mood appears anxious. Her speech is delayed. Cognition and memory are normal. She expresses impulsivity.  Patient is alert and oriented 4. Good eye contact. Gives a coherent history. Chronically slow in her thinking and with a tendency towards impulsive behavior. Does not appear to be acutely psychotic.    Review of Systems  Constitutional: Negative.   HENT: Negative.   Eyes: Negative.   Respiratory: Negative.   Cardiovascular: Negative.   Gastrointestinal: Negative.   Musculoskeletal: Negative.   Skin: Negative.        Patient has some extremely superficial scratches on her left forearm. If she did not point them out to me specifically I probably couldn't of identified them.  Neurological: Negative.   Psychiatric/Behavioral: Positive for hallucinations. Negative for depression, suicidal ideas, memory loss and substance abuse. The patient is nervous/anxious. The patient does not have insomnia.     Blood pressure 99/82, pulse 51, temperature 98.1 F (36.7 C), temperature source Oral, height _0  (1.626 m), weight 70.308 kg (155 lb), last menstrual period 05/07/2015, SpO2 100 %.Body mass index is 26.59 kg/(m^2).  General Appearance: Well Groomed and  She is well groomed by her standards but her skills at makeup application are a little idiosyncratic.  Eye Contact::  Fair  Speech:  Normal Rate  Volume:  Normal  Mood:  Anxious  Affect:  Blunt  Thought Process:  Linear  Orientation:  Full (Time, Place, and Person)  Thought Content:  Hallucinations: Auditory  Suicidal Thoughts:  No  Homicidal Thoughts:  No  Memory:  Immediate;   Good Recent;   Fair Remote;   Fair  Judgement:  Impaired  Insight:  Shallow  Psychomotor Activity:  Normal  Concentration:  Fair  Recall:  [place]  Language: Fair  Akathisia:  No  Handed:  Right  AIMS (if indicated):     Assets:  Communication Skills Desire for Improvement Financial Resources/Insurance Housing Resilience  ADL's:  Intact  Cognition: Impaired,  Mild  Sleep:      Medical Decision Making: Review of Psycho-Social Stressors (1), Review or order clinical lab tests (1), Established Problem, Worsening (2), Review of Medication Regimen & Side Effects (2) and Review of New Medication or Change in Dosage (2)  Treatment Plan Summary: Medication management and Plan Patient currently appears to be pretty much back to her baseline. She chronically has hallucinations but she doesn't show signs of acute delusions. She is denying suicidal or homicidal ideation. She is not showing aggressive behavior. She tends to be sensitive with her emotions and to sometimes go off like this but at this point I don't think she is an acute danger to herself or others. No clear indication to change medicine. Counseling completed about following up with outpatient treatment and working on mood control all of which she agrees to. Patient is not on involuntary commitment. She can be returned back to her group home.  Plan:  Patient does not meet criteria for psychiatric inpatient admission. Supportive therapy provided about ongoing stressors. Disposition: patient is not on IVC. No indication for it. No  change to medicine. Discharge back to her group home. Currently taking a combination of Depakote clonazepam Celexa and Zyprexa as her main psychiatric medicine. Labs are  all unremarkable.  John Clapacs 05/29/2015 3:43 PM

## 2015-08-07 ENCOUNTER — Emergency Department
Admission: EM | Admit: 2015-08-07 | Discharge: 2015-08-08 | Disposition: A | Discharge status: Home / Self Care | Payer: Medicaid Other | Attending: Emergency Medicine | Admitting: Emergency Medicine

## 2015-08-07 ENCOUNTER — Encounter: Payer: Self-pay | Admitting: Emergency Medicine

## 2015-08-07 DIAGNOSIS — Z3202 Encounter for pregnancy test, result negative: Secondary | ICD-10-CM | POA: Diagnosis not present

## 2015-08-07 DIAGNOSIS — F911 Conduct disorder, childhood-onset type: Secondary | ICD-10-CM | POA: Insufficient documentation

## 2015-08-07 DIAGNOSIS — F329 Major depressive disorder, single episode, unspecified: Secondary | ICD-10-CM | POA: Insufficient documentation

## 2015-08-07 DIAGNOSIS — R4689 Other symptoms and signs involving appearance and behavior: Secondary | ICD-10-CM

## 2015-08-07 DIAGNOSIS — Z046 Encounter for general psychiatric examination, requested by authority: Secondary | ICD-10-CM | POA: Diagnosis present

## 2015-08-07 LAB — CBC
HEMATOCRIT: 38 % (ref 35.0–47.0)
Hemoglobin: 12.3 g/dL (ref 12.0–16.0)
MCH: 26.4 pg (ref 26.0–34.0)
MCHC: 32.3 g/dL (ref 32.0–36.0)
MCV: 81.8 fL (ref 80.0–100.0)
PLATELETS: 134 10*3/uL — AB (ref 150–440)
RBC: 4.65 MIL/uL (ref 3.80–5.20)
RDW: 15.6 % — ABNORMAL HIGH (ref 11.5–14.5)
WBC: 6.6 10*3/uL (ref 3.6–11.0)

## 2015-08-07 LAB — COMPREHENSIVE METABOLIC PANEL
ALBUMIN: 4.4 g/dL (ref 3.5–5.0)
ALT: 14 U/L (ref 14–54)
AST: 24 U/L (ref 15–41)
Alkaline Phosphatase: 48 U/L (ref 38–126)
Anion gap: 8 (ref 5–15)
BILIRUBIN TOTAL: 0.3 mg/dL (ref 0.3–1.2)
BUN: 11 mg/dL (ref 6–20)
CHLORIDE: 105 mmol/L (ref 101–111)
CO2: 24 mmol/L (ref 22–32)
CREATININE: 0.61 mg/dL (ref 0.44–1.00)
Calcium: 9.3 mg/dL (ref 8.9–10.3)
GFR calc Af Amer: >60 mL/min (ref >60)
GFR calc non Af Amer: >60 mL/min (ref >60)
GLUCOSE: 87 mg/dL (ref 65–99)
POTASSIUM: 3.2 mmol/L — AB (ref 3.5–5.1)
Sodium: 137 mmol/L (ref 135–145)
TOTAL PROTEIN: 8.1 g/dL (ref 6.5–8.1)

## 2015-08-07 LAB — SALICYLATE LEVEL: Salicylate Lvl: <4 mg/dL (ref 2.8–30.0)

## 2015-08-07 LAB — URINE DRUG SCREEN, QUALITATIVE (ARMC ONLY)
Amphetamines, Ur Screen: NOT DETECTED
Barbiturates, Ur Screen: NOT DETECTED
Benzodiazepine, Ur Scrn: NOT DETECTED
CANNABINOID 50 NG, UR ~~LOC~~: NOT DETECTED
Cocaine Metabolite,Ur ~~LOC~~: NOT DETECTED
MDMA (Ecstasy)Ur Screen: NOT DETECTED
Methadone Scn, Ur: NOT DETECTED
Opiate, Ur Screen: NOT DETECTED
PHENCYCLIDINE (PCP) UR S: NOT DETECTED
Tricyclic, Ur Screen: NOT DETECTED

## 2015-08-07 LAB — ACETAMINOPHEN LEVEL: Acetaminophen (Tylenol), Serum: <10 ug/mL — ABNORMAL LOW (ref 10–30)

## 2015-08-07 LAB — ETHANOL

## 2015-08-07 MED ORDER — DIAZEPAM 5 MG PO TABS
10.0000 mg | ORAL_TABLET | Freq: Once | ORAL | Status: AC
  Administered 2015-08-07: 10 mg via ORAL
  Filled 2015-08-07: qty 2

## 2015-08-07 NOTE — ED Notes (Signed)
Previous documentation completed by this nurse.

## 2015-08-07 NOTE — ED Notes (Signed)
Pt is eligible for discharge. RN spoke to [employer] Director Wynelle Bourgeois) in regards to facilitating transport back to group home. Ms. Eulah Pont stated that she will have her first available staff member arrive here to transport the pt back to the group home. Pt made aware of situation.

## 2015-08-07 NOTE — ED Notes (Signed)
BEHAVIORAL HEALTH ROUNDING  Patient sleeping: No Patient alert and oriented: Yes Behavior appropriate: Yes. ; If no, describe:  Nutrition and fluids offered: Yes Toileting and hygiene offered: Yes Sitter present: No Law enforcement present: Yes   ENVIRONMENTAL ASSESSMENT  Potentially harmful objects out of patient reach: Yes.  Personal belongings secured: Yes.  Patient dressed in hospital provided attire only: Yes.  Plastic bags out of patient reach: Yes.  Patient care equipment (cords, cables, call bells, lines, and drains) shortened, removed, or accounted for: Yes.  Equipment and supplies removed from bottom of stretcher: Yes.  Potentially toxic materials out of patient reach: Yes.  Sharps container removed or out of patient reach: Yes.   

## 2015-08-07 NOTE — Discharge Instructions (Signed)
Aggression °Physically aggressive behavior is common among small children. When frustrated or angry, toddlers may act out. Often, they will push, bite, or hit. Most children show less physical aggression as they grow up. Their language and interpersonal skills improve, too. But continued aggressive behavior is a sign of a problem. This behavior can lead to aggression and delinquency in adolescence and adulthood. °Aggressive behavior can be psychological or physical. Forms of psychological aggression include threatening or bullying others. Forms of physical aggression include:  °· Pushing. °· Hitting. °· Slapping. °· Kicking. °· Stabbing. °· Shooting. °· Raping.  °PREVENTION  °Encouraging the following behaviors can help manage aggression: °· Respecting others and valuing differences. °· Participating in school and community functions, including sports, music, after-school programs, community groups, and volunteer work. °· Talking with an adult when they are sad, depressed, fearful, anxious, or angry. Discussions with a parent or other family member, counselor, teacher, or coach can help. °· Avoiding alcohol and drug use. °· Dealing with disagreements without aggression, such as conflict resolution. To learn this, children need parents and caregivers to model respectful communication and problem solving. °· Limiting exposure to aggression and violence, such as video games that are not age appropriate, violence in the media, or domestic violence. °Document Released: 09/18/2007 Document Revised: 02/13/2012 Document Reviewed: 01/27/2011 °ExitCare® Patient Information ©2015 [employer]. This information is not intended to replace advice given to you by your health care provider. Make sure you discuss any questions you have with your health care provider. ° °Schizophrenia °Schizophrenia is a mental illness. It may cause disturbed or disorganized thinking, speech, or behavior. People with schizophrenia have problems  functioning in one or more areas of life: work, school, home, or relationships. People with schizophrenia are at increased risk for suicide, certain chronic physical illnesses, and unhealthy behaviors, such as smoking and drug use. °People who have family members with schizophrenia are at higher risk of developing the illness. Schizophrenia affects men and women equally but usually appears at an earlier age (teenage or early adult years) in men.  °SYMPTOMS °The earliest symptoms are often subtle (prodrome) and may go unnoticed until the illness becomes more severe (first-break psychosis). Symptoms of schizophrenia may be continuous or may come and go in severity. Episodes often are triggered by major life events, such as family stress, college, military service, marriage, pregnancy or child birth, divorce, or loss of a loved one. People with schizophrenia may see, hear, or feel things that do not exist (hallucinations). They may have false beliefs in spite of obvious proof to the contrary (delusions). Sometimes speech is incoherent or behavior is odd or withdrawn.  °DIAGNOSIS °Schizophrenia is diagnosed through an assessment by your caregiver. Your caregiver will ask questions about your thoughts, behavior, mood, and ability to function in daily life. Your caregiver may ask questions about your medical history and use of alcohol or drugs, including prescription medication. Your caregiver may also order blood tests and imaging exams. Certain medical conditions and substances can cause symptoms that resemble schizophrenia. Your caregiver may refer you to a mental health specialist for evaluation. There are three major criterion for a diagnosis of schizophrenia: °· Two or more of the following five symptoms are present for a month or longer: °¨ Delusions. Often the delusions are that you are being attacked, harassed, cheated, persecuted or conspired against (persecutory delusions). °¨ Hallucinations.   °¨ Disorganized  speech that does not make sense to others. °¨ Grossly disorganized (confused or unfocused) behavior or extremely overactive or underactive motor   activity (catatonia). °¨ Negative symptoms such as bland or blunted emotions (flat affect), loss of will power (avolition), and withdrawal from social contacts (social isolation). °· Level of functioning in one or more major areas of life (work, school, relationships, or self-care) is markedly below the level of functioning before the onset of illness.   °· There are continuous signs of illness (either mild symptoms or decreased level of functioning) for at least 6 months or longer. °TREATMENT  °Schizophrenia is a long-term illness. It is best controlled with continuous treatment rather than treatment only when symptoms occur. The following treatments are used to manage schizophrenia: °· Medication--Medication is the most effective and important form of treatment for schizophrenia. Antipsychotic medications are usually prescribed to help manage schizophrenia. Other types of medication may be added to relieve any symptoms that may occur despite the use of antipsychotic medications. °· Counseling or talk therapy--Individual, group, or family counseling may be helpful in providing education, support, and guidance. Many people with schizophrenia also benefit from social skills and job skills (vocational) training. °A combination of medication and counseling is best for managing the disorder over time. A procedure in which electricity is applied to the brain through the scalp (electroconvulsive therapy) may be used to treat catatonic schizophrenia or schizophrenia in people who cannot take or do not respond to medication and counseling. °Document Released: 11/18/2000 Document Revised: 07/24/2013 Document Reviewed: 02/13/2013 °ExitCare® Patient Information ©2015 [employer]. This information is not intended to replace advice given to you by your health care provider. Make sure  you discuss any questions you have with your health care provider. ° °

## 2015-08-07 NOTE — ED Notes (Signed)
Need UA Sample

## 2015-08-07 NOTE — ED Provider Notes (Addendum)
[facility] Emergency Department Provider Note     Time seen: ----------------------------------------- 7:12 PM on 08/07/2015 -----------------------------------------    I have reviewed the triage vital signs and the nursing notes.   HISTORY  Chief Complaint Psychiatric Evaluation    HPI Krista [name] is a 34 y.o. female presents to ER from a group home with feelings of depression. Patient denies to me thoughts of hurting herself or anyone else. Patient states she can't go back to the group home. She denies any fevers chills other complaints.   Past Medical History  Diagnosis Date  . Schizophrenia   . Depression   . Mental retardation     Patient Active Problem List   Diagnosis Date Noted  . Schizophrenia 05/29/2015  . Mental retardation 05/29/2015    History reviewed. No pertinent past surgical history.  Allergies Review of patient's allergies indicates no known allergies.  Social History Social History  Substance Use Topics  . Smoking status: Never Smoker   . Smokeless tobacco: None  . Alcohol Use: No    Review of Systems Constitutional: Negative for fever. Eyes: Negative for visual changes. ENT: Negative for sore throat. Cardiovascular: Negative for chest pain. Respiratory: Negative for shortness of breath. Gastrointestinal: Negative for abdominal pain, vomiting and diarrhea. Genitourinary: Negative for dysuria. Musculoskeletal: Negative for back pain. Skin: Negative for rash. Neurological: Negative for headaches, focal weakness or numbness. Psychiatric: Patient denies suicidal or homicidal ideations  10-point ROS otherwise negative.  ____________________________________________   PHYSICAL EXAM:  VITAL SIGNS: ED Triage Vitals  Enc Vitals Group     BP 08/07/15 1806 110/79 mmHg     Pulse Rate 08/07/15 1806 51     Resp 08/07/15 1806 20     Temp 08/07/15 1806 98.3 F (36.8 C)     Temp Source 08/07/15 1806 Oral      SpO2 08/07/15 1806 100 %     Weight 08/07/15 1806 150 lb (68.04 kg)     Height 08/07/15 1806 5\' 4"  (1.626 m)     Head Cir --      Peak Flow --      Pain Score --      Pain Loc --      Pain Edu? --      Excl. in GC? --     Constitutional: Alert and oriented. Well appearing and in no distress. Eyes: Conjunctivae are normal. PERRL. Normal extraocular movements. ENT   Head: Normocephalic and atraumatic.   Nose: No congestion/rhinnorhea.   Mouth/Throat: Mucous membranes are moist.   Neck: No stridor. Cardiovascular: Normal rate, regular rhythm. Normal and symmetric distal pulses are present in all extremities. No murmurs, rubs, or gallops. Respiratory: Normal respiratory effort without tachypnea nor retractions. Breath sounds are clear and equal bilaterally. No wheezes/rales/rhonchi. Gastrointestinal: Soft and nontender. No distention. No abdominal bruits.  Musculoskeletal: Nontender with normal range of motion in all extremities. No joint effusions.  No lower extremity tenderness nor edema. Neurologic:  Normal speech and language. No gross focal neurologic deficits are appreciated. Speech is normal. No gait instability. Skin:  Skin is warm, dry and intact. No rash noted. Psychiatric: Mood and affect are normal.  ____________________________________________  ED COURSE:  Pertinent labs & imaging results that were available during my care of the patient were reviewed by me and considered in my medical decision making (see chart for details). Patient is in no acute distress, she does not appear to need hospitalization at this time ____________________________________________    LABS (pertinent  positives/negatives)  Labs Reviewed  COMPREHENSIVE METABOLIC PANEL - Abnormal; Notable for the following:    Potassium 3.2 (*)    All other components within normal limits  ACETAMINOPHEN LEVEL - Abnormal; Notable for the following:    Acetaminophen (Tylenol), Serum <10 (*)     All other components within normal limits  CBC - Abnormal; Notable for the following:    RDW 15.6 (*)    Platelets 134 (*)    All other components within normal limits  ETHANOL  SALICYLATE LEVEL  URINE DRUG SCREEN, QUALITATIVE ([facility] ONLY)  POC URINE PREG, ED    ____________________________________________  FINAL ASSESSMENT AND PLAN  Schizophrenia  Plan: Patient with labs and imaging as dictated above. Patient appears stable to go back to the group home. She will receive some oral Valium here and is otherwise stable for discharge.   Emily Filbert, MD   Emily Filbert, MD 08/07/15 1913  Emily Filbert, MD 08/07/15 [phone]

## 2015-08-07 NOTE — ED Notes (Signed)
Patient to ED from group home with reports of feeling very depressed lately and that she has been feeling like she wants to hurt herself.

## 2015-08-07 NOTE — ED Notes (Signed)
BEHAVIORAL HEALTH ROUNDING Patient sleeping: No. Patient alert and oriented: Yes Behavior appropriate: Yes.  ; If no, describe:  Nutrition and fluids offered: Yes  Toileting and hygiene offered: Yes  Sitter present: no Law enforcement present: Yes  

## 2015-08-08 LAB — POCT PREGNANCY, URINE: Preg Test, Ur: NEGATIVE

## 2015-08-08 MED ORDER — VALACYCLOVIR HCL 500 MG PO TABS
500.0000 mg | ORAL_TABLET | Freq: Once | ORAL | Status: AC
  Administered 2015-08-08: 500 mg via ORAL
  Filled 2015-08-08: qty 1

## 2015-08-08 MED ORDER — CITALOPRAM HYDROBROMIDE 20 MG PO TABS
20.0000 mg | ORAL_TABLET | Freq: Once | ORAL | Status: AC
  Administered 2015-08-08: 20 mg via ORAL
  Filled 2015-08-08: qty 1

## 2015-08-08 MED ORDER — FERROUS SULFATE 325 (65 FE) MG PO TABS
325.0000 mg | ORAL_TABLET | Freq: Once | ORAL | Status: AC
  Administered 2015-08-08: 325 mg via ORAL
  Filled 2015-08-08: qty 1

## 2015-08-08 MED ORDER — SENNOSIDES-DOCUSATE SODIUM 8.6-50 MG PO TABS
2.0000 | ORAL_TABLET | Freq: Once | ORAL | Status: AC
  Administered 2015-08-08: 2 via ORAL
  Filled 2015-08-08: qty 2

## 2015-08-08 NOTE — ED Notes (Signed)
BEHAVIORAL HEALTH ROUNDING Patient sleeping: Yes.   Patient alert and oriented: yes Behavior appropriate: Yes.  ; If no, describe:  Nutrition and fluids offered: Yes  Toileting and hygiene offered: Yes  Sitter present: no Law enforcement present: Yes  

## 2015-08-08 NOTE — ED Notes (Signed)
Sandwich and juice given.

## 2015-08-08 NOTE — ED Notes (Signed)

## 2015-08-08 NOTE — ED Notes (Signed)
BEHAVIORAL HEALTH ROUNDING Patient sleeping: Yes.   Patient alert and oriented: yes Behavior appropriate: Yes.  ; If no, describe:  Nutrition and fluids offered: No Toileting and hygiene offered: No Sitter present: no Law enforcement present: Yes  

## 2015-08-08 NOTE — ED Notes (Signed)
Report received from [facility] . Pt.asleep with signs of no distress or pain.Will continue to monitor for safety  Q 15 minute checks.

## 2015-08-08 NOTE — ED Notes (Signed)
BEHAVIORAL HEALTH ROUNDING Patient sleeping: Yes.   Patient alert and oriented: sleeping Behavior appropriate: Yes.  ; If no, describe:  Nutrition and fluids offered: No Toileting and hygiene offered: No Sitter present: no Law enforcement present: Yes  

## 2015-08-08 NOTE — ED Notes (Signed)
Ride from [employer] is here. Patient has been given lunch.

## 2015-08-08 NOTE — ED Notes (Signed)
Pt. Noted in hallway, eating a sandwhich. No complaints or concerns voiced. No distress or abnormal behavior noted. Will continue to monitor with Q 15 minute rounds continue.

## 2015-08-08 NOTE — ED Notes (Signed)
Patient reports she is having nightmares and hallunications.  States she is concerned because she did not get her night time medications.  Reassured patient would speak with the doctor and make sure she received her morning medications.

## 2015-08-08 NOTE — ED Notes (Signed)
Pt resting in the hallway, pt has been discharged but  awaiting for group home to arrive to transport pt ,

## 2015-09-16 DIAGNOSIS — F79 Unspecified intellectual disabilities: Secondary | ICD-10-CM | POA: Insufficient documentation

## 2015-09-16 DIAGNOSIS — F603 Borderline personality disorder: Secondary | ICD-10-CM | POA: Insufficient documentation

## 2015-09-16 DIAGNOSIS — F251 Schizoaffective disorder, depressive type: Secondary | ICD-10-CM | POA: Insufficient documentation

## 2015-10-24 ENCOUNTER — Emergency Department
Admission: EM | Admit: 2015-10-24 | Discharge: 2015-10-25 | Disposition: A | Discharge status: Home / Self Care | Payer: Medicaid Other | Attending: Emergency Medicine | Admitting: Emergency Medicine

## 2015-10-24 ENCOUNTER — Encounter: Payer: Self-pay | Admitting: Emergency Medicine

## 2015-10-24 ENCOUNTER — Other Ambulatory Visit: Payer: Self-pay

## 2015-10-24 DIAGNOSIS — R45851 Suicidal ideations: Secondary | ICD-10-CM | POA: Diagnosis present

## 2015-10-24 DIAGNOSIS — R079 Chest pain, unspecified: Secondary | ICD-10-CM | POA: Insufficient documentation

## 2015-10-24 DIAGNOSIS — Z3202 Encounter for pregnancy test, result negative: Secondary | ICD-10-CM | POA: Diagnosis not present

## 2015-10-24 DIAGNOSIS — Z79899 Other long term (current) drug therapy: Secondary | ICD-10-CM | POA: Diagnosis not present

## 2015-10-24 LAB — COMPREHENSIVE METABOLIC PANEL
ALT: 12 U/L — ABNORMAL LOW (ref 14–54)
ANION GAP: 8 (ref 5–15)
AST: 25 U/L (ref 15–41)
Albumin: 4.4 g/dL (ref 3.5–5.0)
Alkaline Phosphatase: 44 U/L (ref 38–126)
BILIRUBIN TOTAL: 0.7 mg/dL (ref 0.3–1.2)
BUN: 7 mg/dL (ref 6–20)
CHLORIDE: 104 mmol/L (ref 101–111)
CO2: 27 mmol/L (ref 22–32)
Calcium: 9.5 mg/dL (ref 8.9–10.3)
Creatinine, Ser: 0.58 mg/dL (ref 0.44–1.00)
GFR calc Af Amer: >60 mL/min (ref >60)
Glucose, Bld: 78 mg/dL (ref 65–99)
POTASSIUM: 4.1 mmol/L (ref 3.5–5.1)
Sodium: 139 mmol/L (ref 135–145)
TOTAL PROTEIN: 7.8 g/dL (ref 6.5–8.1)

## 2015-10-24 LAB — TROPONIN I
TROPONIN I: 0.07 ng/mL — AB (ref <0.031)
TROPONIN I: 0.06 ng/mL — AB (ref <0.031)
TROPONIN I: 0.06 ng/mL — AB (ref <0.031)

## 2015-10-24 LAB — URINE DRUG SCREEN, QUALITATIVE (ARMC ONLY)
AMPHETAMINES, UR SCREEN: NOT DETECTED
BENZODIAZEPINE, UR SCRN: NOT DETECTED
Barbiturates, Ur Screen: NOT DETECTED
Cannabinoid 50 Ng, Ur ~~LOC~~: NOT DETECTED
Cocaine Metabolite,Ur ~~LOC~~: NOT DETECTED
MDMA (Ecstasy)Ur Screen: NOT DETECTED
METHADONE SCREEN, URINE: NOT DETECTED
Opiate, Ur Screen: NOT DETECTED
PHENCYCLIDINE (PCP) UR S: NOT DETECTED
TRICYCLIC, UR SCREEN: NOT DETECTED

## 2015-10-24 LAB — POCT PREGNANCY, URINE: PREG TEST UR: NEGATIVE

## 2015-10-24 LAB — CBC
HCT: 37.4 % (ref 35.0–47.0)
Hemoglobin: 12.2 g/dL (ref 12.0–16.0)
MCH: 26.5 pg (ref 26.0–34.0)
MCHC: 32.7 g/dL (ref 32.0–36.0)
MCV: 81.1 fL (ref 80.0–100.0)
PLATELETS: 129 10*3/uL — AB (ref 150–440)
RBC: 4.61 MIL/uL (ref 3.80–5.20)
RDW: 14.7 % — AB (ref 11.5–14.5)
WBC: 5.3 10*3/uL (ref 3.6–11.0)

## 2015-10-24 LAB — ETHANOL

## 2015-10-24 LAB — SALICYLATE LEVEL

## 2015-10-24 LAB — ACETAMINOPHEN LEVEL

## 2015-10-24 NOTE — ED Notes (Signed)
Pt sleeping, awakes easily from sleep for repeat lab draw. Pt denies needs at this time, declines snack offer. Pt denies pain, skin normal color warm and dry, 3+ radial pulse noted, rate 68, resps 14.
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
## 2015-10-24 NOTE — ED Notes (Signed)
Pt requesting meal and drink, pt provided with [place] meal tray and ginger ale to drink. Warm blanket provided.

## 2015-10-24 NOTE — ED Notes (Signed)
No marks or broken skin noted on pts arms.

## 2015-10-24 NOTE — ED Notes (Signed)
Pt states she is at a new group home, states people have been yelling at her, so she wanted to kill herself, was using a fork to try to cut. Pt appears in no distress, flat affect.

## 2015-10-24 NOTE — ED Notes (Signed)
Pt sleeping, awakes easily to verbal stimuli. Pt denies pain currently. Plan of care regarding repeat lab work explained to pt who verbalizes understanding. Pt denies needs at this time.
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
## 2015-10-24 NOTE — ED Notes (Signed)
Report from jeanina, rn.  

## 2015-10-24 NOTE — ED Notes (Signed)
Repeat troponin obtained. Pt denies pain currently. Pt states "i've been having chest cramps for a week, but it's stress." pt denies pain in chest or "cramps" currently. Skin normal color warm and dry. Bed readjusted for comfort.

## 2015-10-24 NOTE — ED Provider Notes (Signed)
[facility] Emergency Department Provider Note  Time seen: 5:12 PM  I have reviewed the triage vital signs and the nursing notes.   HISTORY  Chief Complaint Suicidal    HPI Krista [name] is a 34 y.o. female with a past medical history of depression, schizophrenia, mental retardation, who presents the emergency department with suicidal ideation. According to the patient she is in a new group home and she is having a lot of issues. She states she is very stressed out today and was trying to cut her wrists to kill her self using a fork. Patient told staff which she was doing so they called police who brought her to the emergency department. Patient admits she was trying to kill herself do 2 issues at the group home. No medical complaints at this time. Patient did not break the skin with the fork.     Past Medical History  Diagnosis Date  . Schizophrenia (HCC)   . Depression   . Mental retardation     Patient Active Problem List   Diagnosis Date Noted  . Schizophrenia (HCC) 05/29/2015  . Mental retardation 05/29/2015    Past Surgical History  Procedure Laterality Date  . Cesarean section      Current Outpatient Rx  Name  Route  Sig  Dispense  Refill  . citalopram (CELEXA) 20 MG tablet   Oral   Take 20 mg by mouth daily.         . citalopram (CELEXA) 20 MG tablet   Oral   Take 1 tablet (20 mg total) by mouth daily.   7 tablet   0   . clonazePAM (KLONOPIN) 1 MG tablet   Oral   Take 1 mg by mouth at bedtime.         . clonazePAM (KLONOPIN) 1 MG tablet   Oral   Take 1 tablet (1 mg total) by mouth at bedtime as needed for anxiety.   7 tablet   0   . divalproex (DEPAKOTE ER) 500 MG 24 hr tablet   Oral   Take 1,000 mg by mouth at bedtime.         . divalproex (DEPAKOTE) 500 MG DR tablet   Oral   Take 1 tablet (500 mg total) by mouth 3 (three) times daily.   7 tablet   0   . ferrous sulfate 325 (65 FE) MG tablet   Oral  Take 325 mg by mouth daily.           Marland Kitchen OLANZapine (ZYPREXA) 20 MG tablet   Oral   Take 20 mg by mouth at bedtime.         Marland Kitchen OLANZapine (ZYPREXA) 20 MG tablet   Oral   Take 1 tablet (20 mg total) by mouth at bedtime.   7 tablet   0   . senna-docusate (SENOKOT-S) 8.6-50 MG per tablet   Oral   Take 2 tablets by mouth daily.           Marland Kitchen topiramate (TOPAMAX) 100 MG tablet   Oral   Take 200 mg by mouth at bedtime.         . topiramate (TOPAMAX) 100 MG tablet   Oral   Take 2 tablets (200 mg total) by mouth 2 (two) times daily.   7 tablet   0   . valACYclovir (VALTREX) 500 MG tablet   Oral   Take 500 mg by mouth 2 (two) times daily.         Marland Kitchen  valACYclovir (VALTREX) 500 MG tablet   Oral   Take 1 tablet (500 mg total) by mouth 2 (two) times daily.   14 tablet   0     Allergies Review of patient's allergies indicates no known allergies.  No family history on file.  Social History Social History  Substance Use Topics  . Smoking status: Never Smoker   . Smokeless tobacco: None  . Alcohol Use: No    Review of Systems Constitutional: Negative for fever. Cardiovascular: Some chest pain yesterday and this morning. None currently Respiratory: Negative for shortness of breath. Gastrointestinal: Negative for abdominal pain Musculoskeletal: Negative for back pain. Neurological: Negative for headache 10-point ROS otherwise negative.  ____________________________________________   PHYSICAL EXAM:  VITAL SIGNS: ED Triage Vitals  Enc Vitals Group     BP 10/24/15 1651 126/71 mmHg     Pulse Rate 10/24/15 1651 60     Resp 10/24/15 1651 18     Temp 10/24/15 1651 97.8 F (36.6 C)     Temp Source 10/24/15 1651 Oral     SpO2 10/24/15 1651 97 %     Weight 10/24/15 1651 148 lb (67.132 kg)     Height 10/24/15 1651  (1.626 m)     Head Cir --      Peak Flow --      Pain Score --      Pain Loc --      Pain Edu? --      Excl. in GC? --    Constitutional:  Alert and oriented. Well appearing and in no distress. Eyes: Normal exam ENT   Head: Normocephalic and atraumatic.   Mouth/Throat: Mucous membranes are moist. Cardiovascular: Normal rate, regular rhythm. No murmur Respiratory: Normal respiratory effort without tachypnea nor retractions. Breath sounds are clear and equal bilaterally. No wheezes/rales/rhonchi. Gastrointestinal: Soft and nontender. No distention.  Musculoskeletal: Nontender with normal range of motion in all extremities.  Neurologic:  Normal speech and language. No gross focal neurologic deficits Skin:  Skin is warm, dry and intact.  Psychiatric: Mood and affect are normal. Speech and behavior are normal.   ____________________________________________    EKG  EKG reviewed and interpreted by myself shows sinus bradycardia at 51 bpm, narrow QRS, normal axis, normal intervals, no ST changes noted. Overall normal EKG.  ____________________________________________    INITIAL IMPRESSION / ASSESSMENT AND PLAN / ED COURSE  Pertinent labs & imaging results that were available during my care of the patient were reviewed by me and considered in my medical decision making (see chart for details).  Patient presents for attempting to harm herself with a fork because she is unhappy at her group home. Patient did not break the skin with a fork, no evidence of any recent cutting. Patient does have scars consistent with a history of cutting. Patient also states some chest pain last night and this morning denies any currently. We'll obtain a troponin and EKG in addition to the psychiatric workup. We will place the patient under an involuntary commitment given her suicidal ideation, and have psychiatry evaluate.  Patient workup so far show normal results besides a slightly elevated troponin of 0.07. A repeat troponin has resulted 0.06. We will repeat a third troponin to ensure no change. Patient denies any chest pain in the emergency  department. EKG is reassuring. We have no old troponins for comparison.  Third troponin is 0.06, unchanged. This is likely the patient's baseline. Patient medically cleared at this time, currently awaiting psychiatric evaluation.  ____________________________________________   FINAL CLINICAL IMPRESSION(S) / ED DIAGNOSES  Suicidal ideation Chest pain   Minna AntisKevin Londin Antone, MD 10/24/15 2357

## 2015-10-25 MED ORDER — TOPIRAMATE 25 MG PO TABS
200.0000 mg | ORAL_TABLET | Freq: Every day | ORAL | Status: DC
  Filled 2015-10-25: qty 8

## 2015-10-25 MED ORDER — ACETAMINOPHEN 500 MG PO TABS
1000.0000 mg | ORAL_TABLET | Freq: Once | ORAL | Status: AC
Start: 2015-10-25 — End: 2015-10-25
  Administered 2015-10-25: 1000 mg via ORAL

## 2015-10-25 MED ORDER — SENNA 8.6 MG PO TABS
1.0000 | ORAL_TABLET | Freq: Every evening | ORAL | Status: DC
  Administered 2015-10-25: 8.6 mg via ORAL
  Filled 2015-10-25: qty 1

## 2015-10-25 MED ORDER — VALACYCLOVIR HCL 500 MG PO TABS
500.0000 mg | ORAL_TABLET | Freq: Two times a day (BID) | ORAL | Status: DC
  Administered 2015-10-25: 500 mg via ORAL
  Filled 2015-10-25 (×3): qty 1

## 2015-10-25 MED ORDER — DIVALPROEX SODIUM 500 MG PO DR TAB
500.0000 mg | DELAYED_RELEASE_TABLET | Freq: Two times a day (BID) | ORAL | Status: DC
  Administered 2015-10-25: 500 mg via ORAL
  Filled 2015-10-25 (×2): qty 1

## 2015-10-25 MED ORDER — FERROUS SULFATE 325 (65 FE) MG PO TABS
ORAL_TABLET | ORAL | Status: AC
  Administered 2015-10-25: 325 mg via ORAL
  Filled 2015-10-25: qty 1

## 2015-10-25 MED ORDER — FERROUS SULFATE 325 (65 FE) MG PO TABS
325.0000 mg | ORAL_TABLET | Freq: Every day | ORAL | Status: DC
  Administered 2015-10-25: 325 mg via ORAL
  Filled 2015-10-25: qty 1

## 2015-10-25 MED ORDER — CITALOPRAM HYDROBROMIDE 20 MG PO TABS
20.0000 mg | ORAL_TABLET | Freq: Every day | ORAL | Status: DC
  Administered 2015-10-25: 20 mg via ORAL

## 2015-10-25 MED ORDER — OLANZAPINE 10 MG PO TABS
20.0000 mg | ORAL_TABLET | Freq: Every day | ORAL | Status: DC
  Filled 2015-10-25: qty 2

## 2015-10-25 MED ORDER — VALACYCLOVIR HCL 500 MG PO TABS
ORAL_TABLET | ORAL | Status: AC
  Administered 2015-10-25: 500 mg via ORAL
  Filled 2015-10-25: qty 1

## 2015-10-25 MED ORDER — CITALOPRAM HYDROBROMIDE 20 MG PO TABS
ORAL_TABLET | ORAL | Status: AC
  Administered 2015-10-25: 20 mg via ORAL
  Filled 2015-10-25: qty 1

## 2015-10-25 MED ORDER — ACETAMINOPHEN 500 MG PO TABS
ORAL_TABLET | ORAL | Status: AC
  Filled 2015-10-25: qty 2

## 2015-10-25 NOTE — ED Notes (Signed)
Patient in bed and complains of back and neck discomfort due to sleeping position. Nurse offered patient another pillow for relief. Patient received scheduled medications and was calm and cooperative. Patient also voices that she would like to go back to her group home and apologize for her actions. No other complaints at this time. Maintained on 15 minute checks and observation by security camera for safety.

## 2015-10-25 NOTE — ED Notes (Signed)
Patient asleep in room. No noted distress or abnormal behavior. Will continue 15 minute checks and observation by security cameras for safety. 

## 2015-10-25 NOTE — ED Notes (Signed)
Report received from Karena, RN.  Patient introduction and environmental check done. 

## 2015-10-25 NOTE — ED Notes (Signed)
Pt in room sleeping.

## 2015-10-25 NOTE — ED Notes (Signed)
BEHAVIORAL HEALTH ROUNDING Patient sleeping: Yes.   Patient alert and oriented: not applicable SLEEPING Behavior appropriate: Yes.  ; If no, describe: SLEEPING Nutrition and fluids offered: No SLEEPING Toileting and hygiene offered: NoSLEEPING Sitter present: not applicable Law enforcement present: Yes ODS 

## 2015-10-25 NOTE — Progress Notes (Signed)
Patient evaluated by Psych MD with recommendation to discharge back to group home with follow up out patient services. Call to group home [facility], [phone] [name]( [name] PalmertonWhite) unable to leave a message.  CSW will continue to try to contact group home.  Sammuel Hineseborah Marquise Wicke. Theresia MajorsLCSWA, MSW Clinical Social Work Department [phone] 3:49 PM

## 2015-10-25 NOTE — BHH Counselor (Signed)
[name], reporting that he is on his way to pick up patient.

## 2015-10-25 NOTE — ED Notes (Signed)
Pt discharged with Krista [name] from [facility]. Pt states "i feel fine now, he's nice, i know he cares about me." pt continues to deny dizziness or pain anywhere. Skin normal color warm and dry. resps unlabored.

## 2015-10-25 NOTE — ED Notes (Signed)
Dr. Lenard Lancepaduchowski notified regarding previous shift RN of pt dizziness. Pt states improved dizziness at this time. No new orders obtained from md.

## 2015-10-25 NOTE — Consult Note (Signed)
Krista [name]   Reason for [name]:  Follow up Referring Physician:  ER Patient Identification: Krista [name] MRN:  [id number] Principal Diagnosis: <principal problem not specified> Diagnosis:   Patient Active Problem List   Diagnosis Date Noted  . Schizophrenia ([place]) [F20.9] 05/29/2015  . Mental retardation [F79] 05/29/2015    Total Time spent with patient: 45 minutes  Subjective:   Krista [name] is a 34 y.o. female patient admitted with a long H/O Mi and has.been living at a [place] and attends [facility]. Pt was upset because of yelling and screaming going on at [place] and lost her temper and was brought here for help.  HPI:  As stated above and is eager to back and attend [facility] .  Past Psychiatric History: Was inpt at [facility] and [facility].  Risk to Self: Suicidal Ideation: No-Not Currently/Within Last 6 Months Suicidal Intent: No-Not Currently/Within Last 6 Months Is patient at risk for suicide?: No Suicidal Plan?: No-Not Currently/Within Last 6 Months Access to Means: No What has been your use of drugs/alcohol within the last 12 months?: na How many times?: 5 Other Self Harm Risks: cutting Triggers for Past Attempts: Spouse contact, Family contact, Hallucinations, Unpredictable Intentional Self Injurious Behavior: Cutting Comment - Self Injurious Behavior: cutting Risk to Others: Homicidal Ideation: No-Not Currently/Within Last 6 Months Thoughts of Harm to Others: No-Not Currently Present/Within Last 6 Months Current Homicidal Intent: No-Not Currently/Within Last 6 Months Current Homicidal Plan: No-Not Currently/Within Last 6 Months Access to Homicidal Means: No Identified Victim: na History of harm to others?: No Assessment of Violence: None Noted Violent Behavior Description: na Does patient have access to weapons?: No Criminal Charges Pending?: No Does patient have a court date: No Prior Inpatient Therapy: Prior Inpatient Therapy:  Yes Prior Therapy Dates: multiple times Prior Therapy Facilty/Provider(s): multiple Reason for Treatment: SA, Schizophrenia Prior Outpatient Therapy: Prior Outpatient Therapy: Yes Prior Therapy Dates: current Prior Therapy Facilty/Provider(s): [facility] Reason for Treatment: Schizophrenia Does patient have an ACCT team?: No Does patient have Intensive In-House Services?  : No Does patient have [facility] services? : No Does patient have P4CC services?: No  Past Medical History:  Past Medical History  Diagnosis Date  . Schizophrenia ([place])   . Depression   . Mental retardation     Past Surgical History  Procedure Laterality Date  . Cesarean section     Family History: No family history on file. Family Psychiatric  History: none Social History:  History  Alcohol Use No     History  Drug Use No    Social History   Social History  . Marital Status: Significant Other    Spouse Name: N/A  . Number of Children: N/A  . Years of Education: N/A   Social History Main Topics  . Smoking status: Never Smoker   . Smokeless tobacco: None  . Alcohol Use: No  . Drug Use: No  . Sexual Activity: Not Asked   Other Topics Concern  . None   Social History Narrative   Additional Social History:    Pain Medications: see chart Prescriptions: See Chart Over the Counter: See Chart History of alcohol / drug use?: No history of alcohol / drug abuse                     Allergies:  No Known Allergies  Labs:  Results for orders placed or performed during the hospital encounter of 10/24/15 (from the past 48 hour(s))  Comprehensive metabolic  panel     Status: Abnormal   Collection Time: 10/24/15  4:58 PM  Result Value Ref Range   Sodium 139 135 - 145 mmol/L   Potassium 4.1 3.5 - 5.1 mmol/L   Chloride 104 101 - 111 mmol/L   CO2 27 22 - 32 mmol/L   Glucose, Bld 78 65 - 99 mg/dL   BUN 7 6 - 20 mg/dL   Creatinine, Ser 0.58 0.44 - 1.00 mg/dL   Calcium 9.5 8.9 - 10.3 mg/dL   Total  Protein 7.8 6.5 - 8.1 g/dL   Albumin 4.4 3.5 - 5.0 g/dL   AST 25 15 - 41 U/L   ALT 12 (L) 14 - 54 U/L   Alkaline Phosphatase 44 38 - 126 U/L   Total Bilirubin 0.7 0.3 - 1.2 mg/dL   GFR calc non Af Amer >60 >60 mL/min   GFR calc Af Amer >60 >60 mL/min    Comment: (NOTE) The eGFR has been calculated using the CKD EPI equation. This calculation has not been validated in all clinical situations. eGFR's persistently <60 mL/min signify possible Chronic Kidney Disease.    Anion gap 8 5 - 15  Ethanol (ETOH)     Status: None   Collection Time: 10/24/15  4:58 PM  Result Value Ref Range   Alcohol, Ethyl (B) <5 <5 mg/dL    Comment:        LOWEST DETECTABLE LIMIT FOR SERUM ALCOHOL IS 5 mg/dL FOR MEDICAL PURPOSES ONLY   Salicylate level     Status: None   Collection Time: 10/24/15  4:58 PM  Result Value Ref Range   Salicylate Lvl <0.9 2.8 - 30.0 mg/dL  Acetaminophen level     Status: Abnormal   Collection Time: 10/24/15  4:58 PM  Result Value Ref Range   Acetaminophen (Tylenol), Serum <10 (L) 10 - 30 ug/mL    Comment:        THERAPEUTIC CONCENTRATIONS VARY SIGNIFICANTLY. A RANGE OF 10-30 ug/mL MAY BE AN EFFECTIVE CONCENTRATION FOR MANY PATIENTS. HOWEVER, SOME ARE BEST TREATED AT CONCENTRATIONS OUTSIDE THIS RANGE. ACETAMINOPHEN CONCENTRATIONS >150 ug/mL AT 4 HOURS AFTER INGESTION AND >50 ug/mL AT 12 HOURS AFTER INGESTION ARE OFTEN ASSOCIATED WITH TOXIC REACTIONS.   CBC     Status: Abnormal   Collection Time: 10/24/15  4:58 PM  Result Value Ref Range   WBC 5.3 3.6 - 11.0 K/uL   RBC 4.61 3.80 - 5.20 MIL/uL   Hemoglobin 12.2 12.0 - 16.0 g/dL   HCT 37.4 35.0 - 47.0 %   MCV 81.1 80.0 - 100.0 fL   MCH 26.5 26.0 - 34.0 pg   MCHC 32.7 32.0 - 36.0 g/dL   RDW 14.7 (H) 11.5 - 14.5 %   Platelets 129 (L) 150 - 440 K/uL  Urine Drug Screen, Qualitative ([facility] only)     Status: None   Collection Time: 10/24/15  4:58 PM  Result Value Ref Range   Tricyclic, Ur Screen NONE DETECTED NONE  DETECTED   Amphetamines, Ur Screen NONE DETECTED NONE DETECTED   MDMA (Ecstasy)Ur Screen NONE DETECTED NONE DETECTED   Cocaine Metabolite,Ur [place] NONE DETECTED NONE DETECTED   Opiate, Ur Screen NONE DETECTED NONE DETECTED   Phencyclidine (PCP) Ur S NONE DETECTED NONE DETECTED   Cannabinoid 50 Ng, Ur [place] NONE DETECTED NONE DETECTED   Barbiturates, Ur Screen NONE DETECTED NONE DETECTED   Benzodiazepine, Ur Scrn NONE DETECTED NONE DETECTED   Methadone Scn, Ur NONE DETECTED NONE DETECTED  Comment: (NOTE) 195  Tricyclics, urine               Cutoff 1000 ng/mL 200  Amphetamines, urine             Cutoff 1000 ng/mL 300  MDMA (Ecstasy), urine           Cutoff 500 ng/mL 400  Cocaine Metabolite, urine       Cutoff 300 ng/mL 500  Opiate, urine                   Cutoff 300 ng/mL 600  Phencyclidine (PCP), urine      Cutoff 25 ng/mL 700  Cannabinoid, urine              Cutoff 50 ng/mL 800  Barbiturates, urine             Cutoff 200 ng/mL 900  Benzodiazepine, urine           Cutoff 200 ng/mL 1000 Methadone, urine                Cutoff 300 ng/mL 1100 1200 The urine drug screen provides only a preliminary, unconfirmed 1300 analytical test result and should not be used for non-medical 1400 purposes. Clinical consideration and professional judgment should 1500 be applied to any positive drug screen result due to possible 1600 interfering substances. A more specific alternate chemical method 1700 must be used in order to obtain a confirmed analytical result.  1800 Gas chromato graphy / mass spectrometry (GC/MS) is the preferred 1900 confirmatory method.   Troponin I     Status: Abnormal   Collection Time: 10/24/15  4:58 PM  Result Value Ref Range   Troponin I 0.07 (H) <0.031 ng/mL    Comment: READ BACK AND VERIFIED WITH  JEANINA RODRIGUEZ AT 1803 10/24/15 SDR        PERSISTENTLY INCREASED TROPONIN VALUES IN THE RANGE OF 0.04-0.49 ng/mL CAN BE SEEN IN:       -UNSTABLE ANGINA       -CONGESTIVE  HEART FAILURE       -MYOCARDITIS       -CHEST TRAUMA       -ARRYHTHMIAS       -LATE PRESENTING MYOCARDIAL INFARCTION       -COPD   CLINICAL FOLLOW-UP RECOMMENDED.   Pregnancy, urine POC     Status: None   Collection Time: 10/24/15  5:11 PM  Result Value Ref Range   Preg Test, Ur NEGATIVE NEGATIVE    Comment:        THE SENSITIVITY OF THIS METHODOLOGY IS >24 mIU/mL   Troponin I     Status: Abnormal   Collection Time: 10/24/15  7:38 PM  Result Value Ref Range   Troponin I 0.06 (H) <0.031 ng/mL    Comment: RESULTS PREVIOUSLY CALLED AT  1803 10/24/15 BY SDR        PERSISTENTLY INCREASED TROPONIN VALUES IN THE RANGE OF 0.04-0.49 ng/mL CAN BE SEEN IN:       -UNSTABLE ANGINA       -CONGESTIVE HEART FAILURE       -MYOCARDITIS       -CHEST TRAUMA       -ARRYHTHMIAS       -LATE PRESENTING MYOCARDIAL INFARCTION       -COPD   CLINICAL FOLLOW-UP RECOMMENDED.   Troponin I     Status: Abnormal   Collection Time: 10/24/15 10:45 PM  Result Value Ref Range   Troponin I 0.06 (  H) <0.031 ng/mL    Comment: RESULTS PREVIOUSLY CALLED AT 1803 10/24/15.PMH        PERSISTENTLY INCREASED TROPONIN VALUES IN THE RANGE OF 0.04-0.49 ng/mL CAN BE SEEN IN:       -UNSTABLE ANGINA       -CONGESTIVE HEART FAILURE       -MYOCARDITIS       -CHEST TRAUMA       -ARRYHTHMIAS       -LATE PRESENTING MYOCARDIAL INFARCTION       -COPD   CLINICAL FOLLOW-UP RECOMMENDED.     Current Facility-Administered Medications  Medication Dose Route Frequency Provider Last Rate Last Dose  . acetaminophen (TYLENOL) 500 MG tablet           . citalopram (CELEXA) tablet 20 mg  20 mg Oral Daily Delman Kitten, MD   20 mg at 10/25/15 1157  . divalproex (DEPAKOTE) DR tablet 500 mg  500 mg Oral Q12H Delman Kitten, MD   500 mg at 10/25/15 1215  . ferrous sulfate tablet 325 mg  325 mg Oral Daily Delman Kitten, MD   325 mg at 10/25/15 1159  . OLANZapine (ZYPREXA) tablet 20 mg  20 mg Oral QHS Delman Kitten, MD      . senna [facility])  tablet 8.6 mg  1 tablet Oral QPM Delman Kitten, MD      . topiramate (TOPAMAX) tablet 200 mg  200 mg Oral QHS Delman Kitten, MD      . valACYclovir (VALTREX) tablet 500 mg  500 mg Oral BID Delman Kitten, MD   500 mg at 10/25/15 1158   Current Outpatient Prescriptions  Medication Sig Dispense Refill  . citalopram (CELEXA) 20 MG tablet Take 20 mg by mouth daily.    . clonazePAM (KLONOPIN) 1 MG tablet Take 1 tablet (1 mg total) by mouth at bedtime as needed for anxiety. 7 tablet 0  . divalproex (DEPAKOTE) 500 MG DR tablet Take 500 mg by mouth 2 (two) times daily.    . ferrous sulfate 325 (65 FE) MG tablet Take 325 mg by mouth daily.      Marland Kitchen OLANZapine (ZYPREXA) 20 MG tablet Take 20 mg by mouth at bedtime.    . senna (SENOKOT) 8.6 MG tablet Take 1 tablet by mouth every evening.    . topiramate (TOPAMAX) 100 MG tablet Take 200 mg by mouth at bedtime.    . valACYclovir (VALTREX) 500 MG tablet Take 500 mg by mouth 2 (two) times daily.    . citalopram (CELEXA) 20 MG tablet Take 1 tablet (20 mg total) by mouth daily. 7 tablet 0  . divalproex (DEPAKOTE) 500 MG DR tablet Take 1 tablet (500 mg total) by mouth 3 (three) times daily. 7 tablet 0  . OLANZapine (ZYPREXA) 20 MG tablet Take 1 tablet (20 mg total) by mouth at bedtime. 7 tablet 0  . topiramate (TOPAMAX) 100 MG tablet Take 2 tablets (200 mg total) by mouth 2 (two) times daily. 7 tablet 0  . valACYclovir (VALTREX) 500 MG tablet Take 1 tablet (500 mg total) by mouth 2 (two) times daily. 14 tablet 0    Musculoskeletal: Strength & Muscle Tone: within normal limits Gait & Station: normal Patient leans: N/A  Psychiatric Specialty Exam: Review of Systems  All other systems reviewed and are negative.   Blood pressure 104/64, pulse 75, temperature 98.6 F (37 C), temperature source Oral, resp. rate 15, height '5\' 4"'  (1.626 m), weight 148 lb (67.132 kg), last menstrual period 09/07/2015, SpO2  100 %.Body mass index is 25.39 kg/(m^2).  General Appearance: Casual   Eye Contact::  Fair  Speech:  Clear and Coherent  Volume:  Normal  Mood:  Anxious  Affect:  Appropriate  Thought Process:  Circumstantial  Orientation:  Full (Time, Place, and Person)  Thought Content:  NA  Suicidal Thoughts:  No  Homicidal Thoughts:  No  Memory:  Immediate;   Fair Recent;   Fair Remote;   Fair adequate.  Judgement:  Fair  Insight:  Fair  Psychomotor Activity:  Normal  Concentration:  Fair  Recall:  [employer] of Knowledge:Fair  Language: Fair  Akathisia:  No  Handed:  Right  AIMS (if indicated):     Assets:  Communication Skills Desire for Improvement Housing Social Support Vocational/Educational  ADL's:  Intact  Cognition: WNL  Sleep:      Treatment Plan Summary: Plan D/C IVC a\nd discharge pt back to [facility] for follow up and help  Disposition: No evidence of imminent risk to self or others at present.    Dewain Penning 10/25/2015 2:56 PM

## 2015-10-25 NOTE — ED Notes (Signed)
I called [facility] [phone], pts group home, no answer, mail box full

## 2015-10-25 NOTE — ED Notes (Signed)
Patient taking a shower. Shows no visible signs of distress or complaints at this time. Maintained on 15 minute checks and observation by security camera for safety.

## 2015-10-25 NOTE — Discharge Instructions (Signed)
You have been seen in the emergency department for a  psychiatric concern. You have been evaluated both medically as well as psychiatrically. Please follow-up with your outpatient resources provided. Return to the emergency department for any worsening symptoms, or any thoughts of hurting yourself or anyone else so that we may attempt to help you.  Please also follow-up with cardiology as soon as possible by calling the number provided given your recent chest pain.   Suicidal Feelings: How to Help Yourself Suicide is the taking of one's own life. If you feel as though life is getting too tough to handle and are thinking about suicide, get help right away. To get help:  Call your local emergency services (911 in the [place]  Call a suicide hotline to speak with a trained counselor who understands how you are feeling. The following is a list of suicide hotlines in the [place]. For a list of hotlines in [place], visit [employer]ributor.itwww.suicide.org/hotlines/international/[place]-suicide-hotlines.html.  1-800-273-TALK 828-386-6806(1-7576025757).  1-800-SUICIDE (910) 405-4995(1-657 526 0455).  [phone])[phone]. This is a hotline for Spanish speakers.  4-034-742-5ZDG1-800-799-4TTY 782-467-1780(1-(508) [phone]). This is a hotline for TTY users.  1-866-4-U-TREVOR 716-265-6779(1-(682) 357-9684). This is a hotline for lesbian, gay, bisexual, transgender, or questioning youth.  Contact a crisis center or a local suicide prevention center. To find a crisis center or suicide prevention center:  Call your local hospital, clinic, community service organization, mental health center, social service provider, or health department. Ask for assistance in connecting to a crisis center.  Visit https://www.patel-king.com/www.suicidepreventionlifeline.org/getinvolved/locator for a list of crisis centers in the [place], or visit www.suicideprevention.ca/thinking-about-suicide/find-a-crisis-centre for a list of centers in [place].  Visit the following websites:  National Suicide Prevention Lifeline:  www.suicidepreventionlifeline.org  Hopeline: www.hopeline.com  [employer] for Suicide Prevention: https://www.ayers.com/www.afsp.org  The [employer] Project (for lesbian, gay, bisexual, transgender, or questioning youth): www.thetrevorproject.org HOW CAN I HELP MYSELF FEEL BETTER?  Promise yourself that you will not do anything drastic when you have suicidal feelings. Remember, there is hope. Many people have gotten through suicidal thoughts and feelings, and you will, too. You may have gotten through them before, and this proves that you can get through them again.  Let family, friends, teachers, or counselors know how you are feeling. Try not to isolate yourself from those who care about you. Remember, they will want to help you. Talk with someone every day, even if you do not feel sociable. Face-to-face conversation is best.  Call a mental health professional and see one regularly.  Visit your primary health care provider every year.  Eat a well-balanced diet, and space your meals so you eat regularly.  Get plenty of rest.  Avoid alcohol and drugs, and remove them from your home. They will only make you feel worse.  If you are thinking of taking a lot of medicine, give your medicine to someone who can give it to you one day at a time. If you are on antidepressants and are concerned you will overdose, let your health care provider know so he or she can give you safer medicines. Ask your mental health professional about the possible side effects of any medicines you are taking.  Remove weapons, poisons, knives, and anything else that could harm you from your home.  Try to stick to routines. Follow a schedule every day. Put self-care on your schedule.  Make a list of realistic goals, and cross them off when you achieve them. Accomplishments give a sense of worth.  Wait until you are feeling better before doing the things you find  difficult or unpleasant.  Exercise if you are able. You will feel  better if you exercise for even a half hour each day.  Go out in the sun or into nature. This will help you recover from depression faster. If you have a favorite place to walk, go there.  Do the things that have always given you pleasure. Play your favorite music, read a good book, paint a picture, play your favorite instrument, or do anything else that takes your mind off your depression if it is safe to do.  Keep your living space well lit.  When you are feeling well, write yourself a letter about tips and support that you can read when you are not feeling well.  Remember that life's difficulties can be sorted out with help. Conditions can be treated. You can work on thoughts and strategies that serve you well.   This information is not intended to replace advice given to you by your health care provider. Make sure you discuss any questions you have with your health care provider.   Document Released: 05/28/2003 Document Revised: 12/12/2014 Document Reviewed: 03/18/2014 Elsevier Interactive Patient Education [employer]

## 2015-10-25 NOTE — ED Provider Notes (Signed)
-----------------------------------------   7:54 AM on 10/25/2015 -----------------------------------------   Blood pressure 104/64, pulse 75, temperature 98.6 F (37 C), temperature source Oral, resp. rate 15, height 5\' 4"  (1.626 m), weight 148 lb (67.132 kg), last menstrual period 09/07/2015, SpO2 100 %.  The patient had no acute events since last update.  Calm and cooperative at this time.  Disposition is pending per Psychiatry/Behavioral Medicine team recommendations.     Rebecka ApleyAllison P Rheannon Cerney, MD 10/25/15 [phone]

## 2015-10-25 NOTE — ED Notes (Signed)
Patient continues to complain about headache and now endorses dizziness when walking and sitting. Vital signs were taken orthostatically and were within normal limits, and patient was then offered water.  Patient also complained of increased stress and stated that her headache may be related to stress. She stated that she really doesn't like her group home and doesn't feel like she gets treated fairly there. She states that she wishes to get in touch with her guardian to consider alternate placement. Patient was tearful while sharing this with nurse and nurse provided reassurance. Maintained on 15 minute checks and observation by security camera for safety.

## 2015-10-25 NOTE — ED Notes (Signed)
Pt given breakfast tray

## 2015-10-25 NOTE — ED Notes (Signed)
Pt brought into ED BHU via sally port and wand with metal detector for safety by [employer] officer. Patient oriented to unit/care area: Pt informed of unit policies and procedures.  Informed that, for their safety, care areas are designed for safety and monitored by security cameras at all times; and visiting hours explained to patient. Patient verbalizes understanding, and verbal contract for safety obtained.Pt shown to their room.   ENVIRONMENTAL ASSESSMENT  Potentially harmful objects out of patient reach: Yes.  Personal belongings secured: Yes.  Patient dressed in hospital provided attire only: Yes.  Plastic bags out of patient reach: Yes.  Patient care equipment (cords, cables, call bells, lines, and drains) shortened, removed, or accounted for: Yes.  Equipment and supplies removed from bottom of stretcher: Yes.  Potentially toxic materials out of patient reach: Yes.  Sharps container removed or out of patient reach: Yes.   BEHAVIORAL HEALTH ROUNDING  Patient sleeping: No.  Patient alert and oriented: yes  Behavior appropriate: Yes. ; If no, describe:  Nutrition and fluids offered: Yes  Toileting and hygiene offered: Yes  Sitter present: not applicable  Law enforcement present: Yes [employer]  

## 2015-10-25 NOTE — ED Notes (Signed)
Call placed to [phone] and [phone] [facility] with no answer. Call placed to central communications to have police officer attempt to locate group home staff and call ed back regarding pt's discharge.

## 2015-10-25 NOTE — ED Notes (Signed)
ENVIRONMENTAL ASSESSMENT Potentially harmful objects out of patient reach: Yes Personal belongings secured: Yes Patient dressed in hospital provided attire only: Yes Plastic bags out of patient reach: Yes Patient care equipment (cords, cables, call bells, lines, and drains) shortened, removed, or accounted for: Yes Equipment and supplies removed from bottom of stretcher: Yes Potentially toxic materials out of patient reach: Yes Sharps container removed or out of patient reach: Yes  Patient in bed eating breakfast. Maintained on 15 minute checks and observation by security camera for safety.  

## 2015-10-25 NOTE — ED Notes (Signed)
Patient resting quietly in room. No noted distress or abnormal behaviors noted. Will continue 15 minute checks and observation by security camera for safety. 

## 2015-10-25 NOTE — ED Notes (Signed)
Patient consulted with doctor and was given 1000 mg of Tylenol for headache. Maintained on 15 minute checks and observation by security camera for safety.

## 2015-10-25 NOTE — ED Notes (Signed)
Patient consulted with physician and received Tylenol 1000 mg for headache. Patient is now resting quietly. Maintained on 15 minute checks and observation by security camera for safety.

## 2015-10-25 NOTE — ED Notes (Signed)
Patient in room eating lunch at this time. Does not have any complaints nor shows signs of distress. Maintained on 15 minute checks and observation by security camera for safety.

## 2015-10-25 NOTE — ED Notes (Signed)
Patient has no further complaints of headache and is resting the the room. No signs of distress noted. Maintained on 15 minute checks and observation by security camera for safety.

## 2015-10-25 NOTE — ED Notes (Signed)
Pt's group home staff is here to pick pt up. Pt dressing. Will not give hs medications due to discharge.

## 2015-11-20 ENCOUNTER — Encounter: Payer: Self-pay | Admitting: Emergency Medicine

## 2015-11-20 ENCOUNTER — Emergency Department
Admission: EM | Admit: 2015-11-20 | Discharge: 2015-11-21 | Disposition: A | Discharge status: Home / Self Care | Payer: Medicaid Other | Attending: Emergency Medicine | Admitting: Emergency Medicine

## 2015-11-20 DIAGNOSIS — F329 Major depressive disorder, single episode, unspecified: Secondary | ICD-10-CM | POA: Insufficient documentation

## 2015-11-20 DIAGNOSIS — F911 Conduct disorder, childhood-onset type: Secondary | ICD-10-CM | POA: Diagnosis present

## 2015-11-20 DIAGNOSIS — Z79899 Other long term (current) drug therapy: Secondary | ICD-10-CM | POA: Insufficient documentation

## 2015-11-20 DIAGNOSIS — R454 Irritability and anger: Secondary | ICD-10-CM | POA: Diagnosis not present

## 2015-11-20 DIAGNOSIS — F209 Schizophrenia, unspecified: Secondary | ICD-10-CM | POA: Diagnosis not present

## 2015-11-20 LAB — URINE DRUG SCREEN, QUALITATIVE (ARMC ONLY)
AMPHETAMINES, UR SCREEN: NOT DETECTED
BENZODIAZEPINE, UR SCRN: NOT DETECTED
Barbiturates, Ur Screen: NOT DETECTED
Cannabinoid 50 Ng, Ur ~~LOC~~: NOT DETECTED
Cocaine Metabolite,Ur ~~LOC~~: NOT DETECTED
MDMA (ECSTASY) UR SCREEN: NOT DETECTED
METHADONE SCREEN, URINE: NOT DETECTED
Opiate, Ur Screen: NOT DETECTED
PHENCYCLIDINE (PCP) UR S: NOT DETECTED
Tricyclic, Ur Screen: NOT DETECTED

## 2015-11-20 LAB — COMPREHENSIVE METABOLIC PANEL
ALBUMIN: 4.3 g/dL (ref 3.5–5.0)
ALT: 18 U/L (ref 14–54)
AST: 28 U/L (ref 15–41)
Alkaline Phosphatase: 48 U/L (ref 38–126)
Anion gap: 9 (ref 5–15)
CHLORIDE: 100 mmol/L — AB (ref 101–111)
CO2: 28 mmol/L (ref 22–32)
Calcium: 9.1 mg/dL (ref 8.9–10.3)
Creatinine, Ser: 0.51 mg/dL (ref 0.44–1.00)
GFR calc Af Amer: >60 mL/min (ref >60)
GFR calc non Af Amer: >60 mL/min (ref >60)
GLUCOSE: 80 mg/dL (ref 65–99)
POTASSIUM: 3.6 mmol/L (ref 3.5–5.1)
Sodium: 137 mmol/L (ref 135–145)
Total Bilirubin: 0.5 mg/dL (ref 0.3–1.2)
Total Protein: 7.7 g/dL (ref 6.5–8.1)

## 2015-11-20 LAB — ACETAMINOPHEN LEVEL: Acetaminophen (Tylenol), Serum: <10 ug/mL — ABNORMAL LOW (ref 10–30)

## 2015-11-20 LAB — CBC
HEMATOCRIT: 38.5 % (ref 35.0–47.0)
Hemoglobin: 12.5 g/dL (ref 12.0–16.0)
MCH: 26.1 pg (ref 26.0–34.0)
MCHC: 32.4 g/dL (ref 32.0–36.0)
MCV: 80.5 fL (ref 80.0–100.0)
PLATELETS: 139 10*3/uL — AB (ref 150–440)
RBC: 4.79 MIL/uL (ref 3.80–5.20)
RDW: 14.2 % (ref 11.5–14.5)
WBC: 4.7 10*3/uL (ref 3.6–11.0)

## 2015-11-20 LAB — SALICYLATE LEVEL: Salicylate Lvl: <4 mg/dL (ref 2.8–30.0)

## 2015-11-20 LAB — ETHANOL

## 2015-11-20 MED ORDER — VALACYCLOVIR HCL 500 MG PO TABS
ORAL_TABLET | ORAL | Status: AC
  Administered 2015-11-20: 500 mg via ORAL
  Filled 2015-11-20: qty 1

## 2015-11-20 MED ORDER — FERROUS SULFATE 325 (65 FE) MG PO TABS
325.0000 mg | ORAL_TABLET | Freq: Every day | ORAL | Status: DC
  Administered 2015-11-21: 325 mg via ORAL
  Filled 2015-11-20 (×2): qty 1

## 2015-11-20 MED ORDER — DIVALPROEX SODIUM 500 MG PO DR TAB
DELAYED_RELEASE_TABLET | ORAL | Status: AC
  Administered 2015-11-20: 500 mg via ORAL
  Filled 2015-11-20: qty 1

## 2015-11-20 MED ORDER — OLANZAPINE 10 MG PO TABS
ORAL_TABLET | ORAL | Status: AC
  Filled 2015-11-20: qty 1

## 2015-11-20 MED ORDER — SENNA 8.6 MG PO TABS
1.0000 | ORAL_TABLET | Freq: Every day | ORAL | Status: DC
  Filled 2015-11-20: qty 1

## 2015-11-20 MED ORDER — DIVALPROEX SODIUM 500 MG PO DR TAB
500.0000 mg | DELAYED_RELEASE_TABLET | Freq: Two times a day (BID) | ORAL | Status: DC
  Administered 2015-11-20 – 2015-11-21 (×2): 500 mg via ORAL
  Filled 2015-11-20: qty 1

## 2015-11-20 MED ORDER — OLANZAPINE 5 MG PO TABS
15.0000 mg | ORAL_TABLET | Freq: Every day | ORAL | Status: DC
  Administered 2015-11-20: 15 mg via ORAL

## 2015-11-20 MED ORDER — VALACYCLOVIR HCL 500 MG PO TABS
500.0000 mg | ORAL_TABLET | Freq: Two times a day (BID) | ORAL | Status: DC
  Administered 2015-11-20 – 2015-11-21 (×2): 500 mg via ORAL
  Filled 2015-11-20 (×2): qty 1

## 2015-11-20 MED ORDER — CITALOPRAM HYDROBROMIDE 20 MG PO TABS
20.0000 mg | ORAL_TABLET | Freq: Every day | ORAL | Status: DC
  Administered 2015-11-21: 20 mg via ORAL
  Filled 2015-11-20: qty 1

## 2015-11-20 MED ORDER — CLONAZEPAM 1 MG PO TABS: 1.0000 mg | ORAL_TABLET | Freq: Every evening | ORAL | Status: DC | PRN

## 2015-11-20 MED ORDER — SENNOSIDES-DOCUSATE SODIUM 8.6-50 MG PO TABS
ORAL_TABLET | ORAL | Status: AC
  Administered 2015-11-20: 1
  Filled 2015-11-20: qty 1

## 2015-11-20 MED ORDER — OLANZAPINE 5 MG PO TABS
ORAL_TABLET | ORAL | Status: AC
  Filled 2015-11-20: qty 1

## 2015-11-20 NOTE — ED Notes (Signed)
Hand off report given to Margaret, RN

## 2015-11-20 NOTE — ED Notes (Signed)
Patient presents to the ED from her group home on [address].  Patient states she moved there in October and she states that today she became very frustrated and was unable to control her anger.  Patient states she took her radio and threw it and broke it.  Patient states, "I don't know why I couldn't control my anger."  Patient denies SI and HI, denies hearing voices or seeing things.  Patient reports taking her medication like she is supposed to.  Patient states, "sometimes at the group home I just feel like I don't have anyone to listen to me."  Patient is calm and cooperative at this time.

## 2015-11-20 NOTE — BHH Counselor (Signed)
Left message with patient's legal guardian, Ladene ArtistDerrick [phone]).

## 2015-11-20 NOTE — ED Notes (Signed)
Snack and beverage given. 

## 2015-11-20 NOTE — ED Notes (Signed)
Pt. Noted in room resting quietly;. No complaints or concerns voiced. No distress or abnormal behavior noted. Will continue to monitor with security cameras. Q 15 minute rounds continue. 
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
## 2015-11-20 NOTE — ED Notes (Signed)
Report received from Luis F., RN. Pt. Alert and oriented in no distress denies SI, HI, AVH and pain.  Pt. Instructed to come to me with problems or concerns.Will continue to monitor for safety via security cameras and Q 15 minute checks. 

## 2015-11-20 NOTE — ED Notes (Signed)
[facility] up this evening. Awaiting further notice/update.

## 2015-11-20 NOTE — Discharge Instructions (Signed)
Please seek medical attention and help for any thoughts about wanting to harm herself, harm others, any concerning change in behavior, severe depression, inappropriate drug use or any other new or concerning symptoms.  Anger Management Anger is a normal human emotion. However, anger can range from mild irritation to rage. When your anger becomes harmful to yourself or others, it is unhealthy anger.  CAUSES  There are many reasons for unhealthy anger. Many people learn how to express anger from observing how their family expressed anger. In troubled, chaotic, or abusive families, anger can be expressed as rage or even violence. Children can grow up never learning how healthy anger can be expressed. Factors that contribute to unhealthy anger include:   Drug or alcohol abuse.  Post-traumatic stress disorder.  Traumatic brain injury. COMPLICATIONS  People with unhealthy anger tend to overreact and retaliate against a real or imagined threat. The need to retaliate can turn into violence or verbal abuse against another person. Chronic anger can lead to health problems, such as hypertension, high blood pressure, and depression. TREATMENT  Exercising, relaxing, meditating, or writing out your feelings all can be beneficial in managing moderate anger. For unhealthy anger, the following methods may be used:  Cognitive-behavioral counseling (learning skills to change the thoughts that influence your mood).  Relaxation training.  Interpersonal counseling.  Assertive communication skills.  Medication.   This information is not intended to replace advice given to you by your health care provider. Make sure you discuss any questions you have with your health care provider.   Document Released: 09/18/2007 Document Revised: 02/13/2012 Document Reviewed: 01/27/2011 Elsevier Interactive Patient Education [employer] Elsevier Inc.
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
## 2015-11-21 NOTE — ED Notes (Addendum)
Pt up in bed, awake. Denies further needs. Will continue to monitor.

## 2015-11-21 NOTE — ED Notes (Signed)
Pt given breakfast tray

## 2015-11-21 NOTE — ED Notes (Signed)
Pt. Noted in room resting quietly;. No complaints or concerns voiced. No distress or abnormal behavior noted. Will continue to monitor with security cameras. Q 15 minute rounds continue. 

## 2015-11-21 NOTE — ED Notes (Signed)
Pt alert and oriented X4, active, cooperative, pt in NAD. RR even and unlabored, color WNL.    

## 2015-11-21 NOTE — ED Notes (Signed)
Report from Christina, RN

## 2015-12-27 ENCOUNTER — Emergency Department
Admission: EM | Admit: 2015-12-27 | Discharge: 2015-12-28 | Disposition: A | Discharge status: Home / Self Care | Payer: Medicaid Other | Attending: Emergency Medicine | Admitting: Emergency Medicine

## 2015-12-27 DIAGNOSIS — F329 Major depressive disorder, single episode, unspecified: Secondary | ICD-10-CM | POA: Insufficient documentation

## 2015-12-27 DIAGNOSIS — Y998 Other external cause status: Secondary | ICD-10-CM | POA: Diagnosis not present

## 2015-12-27 DIAGNOSIS — F203 Undifferentiated schizophrenia: Secondary | ICD-10-CM | POA: Diagnosis not present

## 2015-12-27 DIAGNOSIS — Y9389 Activity, other specified: Secondary | ICD-10-CM | POA: Insufficient documentation

## 2015-12-27 DIAGNOSIS — Z3202 Encounter for pregnancy test, result negative: Secondary | ICD-10-CM | POA: Insufficient documentation

## 2015-12-27 DIAGNOSIS — F209 Schizophrenia, unspecified: Secondary | ICD-10-CM | POA: Diagnosis present

## 2015-12-27 DIAGNOSIS — A6000 Herpesviral infection of urogenital system, unspecified: Secondary | ICD-10-CM

## 2015-12-27 DIAGNOSIS — R443 Hallucinations, unspecified: Secondary | ICD-10-CM

## 2015-12-27 DIAGNOSIS — S50812A Abrasion of left forearm, initial encounter: Secondary | ICD-10-CM | POA: Insufficient documentation

## 2015-12-27 DIAGNOSIS — Z79899 Other long term (current) drug therapy: Secondary | ICD-10-CM | POA: Diagnosis not present

## 2015-12-27 DIAGNOSIS — X788XXA Intentional self-harm by other sharp object, initial encounter: Secondary | ICD-10-CM | POA: Insufficient documentation

## 2015-12-27 DIAGNOSIS — R45851 Suicidal ideations: Secondary | ICD-10-CM | POA: Diagnosis present

## 2015-12-27 DIAGNOSIS — Y9289 Other specified places as the place of occurrence of the external cause: Secondary | ICD-10-CM | POA: Diagnosis not present

## 2015-12-27 DIAGNOSIS — F79 Unspecified intellectual disabilities: Secondary | ICD-10-CM

## 2015-12-27 LAB — COMPREHENSIVE METABOLIC PANEL
ALBUMIN: 4.5 g/dL (ref 3.5–5.0)
ALK PHOS: 38 U/L (ref 38–126)
ALT: 10 U/L — ABNORMAL LOW (ref 14–54)
AST: 21 U/L (ref 15–41)
Anion gap: 6 (ref 5–15)
BILIRUBIN TOTAL: 0.8 mg/dL (ref 0.3–1.2)
BUN: 7 mg/dL (ref 6–20)
CO2: 26 mmol/L (ref 22–32)
Calcium: 9.3 mg/dL (ref 8.9–10.3)
Chloride: 106 mmol/L (ref 101–111)
Creatinine, Ser: 0.55 mg/dL (ref 0.44–1.00)
Glucose, Bld: 83 mg/dL (ref 65–99)
POTASSIUM: 3.8 mmol/L (ref 3.5–5.1)
SODIUM: 138 mmol/L (ref 135–145)
TOTAL PROTEIN: 8.1 g/dL (ref 6.5–8.1)

## 2015-12-27 LAB — CBC
HCT: 38.7 % (ref 35.0–47.0)
Hemoglobin: 12.3 g/dL (ref 12.0–16.0)
MCH: 25.4 pg — AB (ref 26.0–34.0)
MCHC: 31.8 g/dL — ABNORMAL LOW (ref 32.0–36.0)
MCV: 79.9 fL — AB (ref 80.0–100.0)
PLATELETS: 126 10*3/uL — AB (ref 150–440)
RBC: 4.85 MIL/uL (ref 3.80–5.20)
RDW: 14.5 % (ref 11.5–14.5)
WBC: 5.6 10*3/uL (ref 3.6–11.0)

## 2015-12-27 LAB — ETHANOL

## 2015-12-27 LAB — URINALYSIS COMPLETE WITH MICROSCOPIC (ARMC ONLY)
Bilirubin Urine: NEGATIVE
GLUCOSE, UA: NEGATIVE mg/dL
Hgb urine dipstick: NEGATIVE
Ketones, ur: NEGATIVE mg/dL
Leukocytes, UA: NEGATIVE
Nitrite: NEGATIVE
Protein, ur: NEGATIVE mg/dL
SPECIFIC GRAVITY, URINE: 1.005 (ref 1.005–1.030)
pH: 7 (ref 5.0–8.0)

## 2015-12-27 LAB — URINE DRUG SCREEN, QUALITATIVE (ARMC ONLY)
Amphetamines, Ur Screen: NOT DETECTED
BENZODIAZEPINE, UR SCRN: NOT DETECTED
Barbiturates, Ur Screen: NOT DETECTED
CANNABINOID 50 NG, UR ~~LOC~~: NOT DETECTED
Cocaine Metabolite,Ur ~~LOC~~: NOT DETECTED
MDMA (Ecstasy)Ur Screen: NOT DETECTED
Methadone Scn, Ur: NOT DETECTED
Opiate, Ur Screen: NOT DETECTED
PHENCYCLIDINE (PCP) UR S: NOT DETECTED
TRICYCLIC, UR SCREEN: NOT DETECTED

## 2015-12-27 LAB — SALICYLATE LEVEL

## 2015-12-27 LAB — ACETAMINOPHEN LEVEL: Acetaminophen (Tylenol), Serum: <10 ug/mL — ABNORMAL LOW (ref 10–30)

## 2015-12-27 LAB — POCT PREGNANCY, URINE: Preg Test, Ur: NEGATIVE

## 2015-12-27 MED ORDER — OLANZAPINE 5 MG PO TABS
15.0000 mg | ORAL_TABLET | Freq: Every day | ORAL | Status: DC
  Administered 2015-12-27: 15 mg via ORAL
  Filled 2015-12-27: qty 1
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
## 2015-12-27 NOTE — ED Notes (Signed)
REPORT RECIE
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
## 2015-12-28 DIAGNOSIS — A6000 Herpesviral infection of urogenital system, unspecified: Secondary | ICD-10-CM

## 2015-12-28 DIAGNOSIS — F203 Undifferentiated schizophrenia: Secondary | ICD-10-CM | POA: Diagnosis not present

## 2015-12-28 MED ORDER — DIVALPROEX SODIUM 500 MG PO DR TAB
500.0000 mg | DELAYED_RELEASE_TABLET | Freq: Three times a day (TID) | ORAL | Status: DC
  Administered 2015-12-28: 500 mg via ORAL
  Filled 2015-12-28: qty 1

## 2015-12-28 MED ORDER — CITALOPRAM HYDROBROMIDE 20 MG PO TABS
20.0000 mg | ORAL_TABLET | Freq: Every day | ORAL | Status: DC
  Administered 2015-12-28: 20 mg via ORAL
  Filled 2015-12-28: qty 1

## 2015-12-28 MED ORDER — OLANZAPINE 10 MG PO TABS
20.0000 mg | ORAL_TABLET | Freq: Every day | ORAL | Status: DC
Start: 2015-12-28 — End: 2015-12-29
  Filled 2015-12-28: qty 2

## 2015-12-28 MED ORDER — TOPIRAMATE 100 MG PO TABS
200.0000 mg | ORAL_TABLET | Freq: Two times a day (BID) | ORAL | Status: DC
Start: 2015-12-28 — End: 2015-12-29
  Administered 2015-12-28: 200 mg via ORAL
  Filled 2015-12-28: qty 2

## 2015-12-28 MED ORDER — CLONAZEPAM 0.5 MG PO TABS: 1.0000 mg | ORAL_TABLET | Freq: Every day | ORAL | Status: DC

## 2015-12-28 MED ORDER — VALACYCLOVIR HCL 500 MG PO TABS
500.0000 mg | ORAL_TABLET | Freq: Two times a day (BID) | ORAL | Status: DC
  Administered 2015-12-28: 500 mg via ORAL
  Filled 2015-12-28: qty 1

## 2015-12-28 NOTE — ED Notes (Signed)
BEHAVIORAL HEALTH ROUNDING Patient sleeping: No. Patient alert and oriented: yes Behavior appropriate: Yes.  ; If no, describe:  Nutrition and fluids offered: Yes  Toileting and hygiene offered: Yes  Sitter present: not applicable Law enforcement present: Yes  

## 2015-12-28 NOTE — ED Notes (Signed)
[name]:  [phone]

## 2015-12-28 NOTE — ED Provider Notes (Signed)
-----------------------------------------   3:44 PM on 12/28/2015 -----------------------------------------  She was seen and evaluated by social work and psychiatry. Patient is cleared for discharge. She has no medical complaints at this time. There is no SI no HI and psychiatry feels she is safe for discharge  Krista Plant, MD 12/28/15 1544

## 2015-12-28 NOTE — Discharge Instructions (Signed)
Schizophrenia °Schizophrenia is a mental illness. It may cause disturbed or disorganized thinking, speech, or behavior. People with schizophrenia have problems functioning in one or more areas of life: work, school, home, or relationships. People with schizophrenia are at increased risk for suicide, certain chronic physical illnesses, and unhealthy behaviors, such as smoking and drug use. °People who have family members with schizophrenia are at higher risk of developing the illness. Schizophrenia affects men and women equally but usually appears at an earlier age (teenage or early adult years) in men.  °SYMPTOMS °The earliest symptoms are often subtle (prodrome) and may go unnoticed until the illness becomes more severe (first-break psychosis). Symptoms of schizophrenia may be continuous or may come and go in severity. Episodes often are triggered by major life events, such as family stress, college, military service, marriage, pregnancy or child birth, divorce, or loss of a loved one. People with schizophrenia may see, hear, or feel things that do not exist (hallucinations). They may have false beliefs in spite of obvious proof to the contrary (delusions). Sometimes speech is incoherent or behavior is odd or withdrawn.  °DIAGNOSIS °Schizophrenia is diagnosed through an assessment by your caregiver. Your caregiver will ask questions about your thoughts, behavior, mood, and ability to function in daily life. Your caregiver may ask questions about your medical history and use of alcohol or drugs, including prescription medication. Your caregiver may also order blood tests and imaging exams. Certain medical conditions and substances can cause symptoms that resemble schizophrenia. Your caregiver may refer you to a mental health specialist for evaluation. There are three major criterion for a diagnosis of schizophrenia: °· Two or more of the following five symptoms are present for a month or longer: °¨ Delusions. Often  the delusions are that you are being attacked, harassed, cheated, persecuted or conspired against (persecutory delusions). °¨ Hallucinations.   °¨ Disorganized speech that does not make sense to others. °¨ Grossly disorganized (confused or unfocused) behavior or extremely overactive or underactive motor activity (catatonia). °¨ Negative symptoms such as bland or blunted emotions (flat affect), loss of will power (avolition), and withdrawal from social contacts (social isolation). °· Level of functioning in one or more major areas of life (work, school, relationships, or self-care) is markedly below the level of functioning before the onset of illness.   °· There are continuous signs of illness (either mild symptoms or decreased level of functioning) for at least 6 months or longer. °TREATMENT  °Schizophrenia is a long-term illness. It is best controlled with continuous treatment rather than treatment only when symptoms occur. The following treatments are used to manage schizophrenia: °· Medication--Medication is the most effective and important form of treatment for schizophrenia. Antipsychotic medications are usually prescribed to help manage schizophrenia. Other types of medication may be added to relieve any symptoms that may occur despite the use of antipsychotic medications. °· Counseling or talk therapy--Individual, group, or family counseling may be helpful in providing education, support, and guidance. Many people with schizophrenia also benefit from social skills and job skills (vocational) training. °A combination of medication and counseling is best for managing the disorder over time. A procedure in which electricity is applied to the brain through the scalp (electroconvulsive therapy) may be used to treat catatonic schizophrenia or schizophrenia in people who cannot take or do not respond to medication and counseling. °  °This information is not intended to replace advice given to you by your health  care provider. Make sure you discuss any questions you have with   your health care provider. °  °Document Released: 11/18/2000 Document Revised: 12/12/2014 Document Reviewed: 02/13/2013 °Elsevier Interactive Patient Education ©2016 [employer] ° °
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
## 2015-12-28 NOTE — ED Provider Notes (Signed)
-----------------------------------------   6:58 AM on 12/28/2015 -----------------------------------------   Blood pressure 108/83, pulse 62, temperature 98.5 F (36.9 C), temperature source Oral, resp. rate 16, height  (1.626 m), weight 160 lb (72.576 kg), SpO2 96 %.  The patient had no acute events since last update.  Calm and cooperative at this time.  Disposition is pending per Psychiatry/Behavioral Medicine team recommendations.     Irean Hong, MD 12/28/15 [phone]

## 2015-12-28 NOTE — Progress Notes (Signed)
LCSW met with patient and Dr Weber Cooks. In consultation patient was able to identify her stressors at the group home and had good recall of events and what medications she takes.  In conclusion Dr Weber Cooks after a full assessment deemed patient  is psychiatrically cleared for discharge.  LCSW called her Guardian to let her know that patient has been discharged from ED and will require pick up. Called Group Home and let the house manager know that patient is ready for pick up.   She requested I call [name] [phone] Called and left a message. Patiejnt is to be picked up awaiting call back.

## 2015-12-28 NOTE — ED Notes (Signed)
BEHAVIORAL HEALTH ROUNDING Patient sleeping: Yes.   Patient alert and oriented: not applicable Behavior appropriate: Yes.  ; If no, describe:  Nutrition and fluids offered: No Toileting and hygiene offered: No Sitter present: not applicable Law enforcement present: Yes  

## 2015-12-28 NOTE — ED Notes (Signed)
Pt taken home by [name], with [facility].

## 2015-12-28 NOTE — ED Notes (Signed)
BEHAVIORAL HEALTH ROUNDING Patient sleeping: Yes.   Patient alert and oriented: not applicable Behavior appropriate: Yes.  ; If no, describe:  Nutrition and fluids offered: Yes  Toileting and hygiene offered: Yes  Sitter present: not applicable Law enforcement present: Yes  

## 2015-12-28 NOTE — BH Assessment (Signed)
Pt has been referred to:  Lanny Hurst [facility]

## 2015-12-28 NOTE — ED Notes (Signed)
Social worker reports has left a message with group home, [name] [phone] re plan for discharge and consult re transport. Await response.

## 2015-12-28 NOTE — ED Notes (Signed)
Patient has taken breakfast, now resting calmly in bed. Conversation with staff limited but clear and appropriate.

## 2015-12-28 NOTE — ED Notes (Signed)
Still no response from group home Interior and spatial designer per Child psychotherapist.

## 2015-12-28 NOTE — ED Notes (Signed)

## 2015-12-28 NOTE — Progress Notes (Signed)
LCSW called Group home provider again and left another message. Called Guardian Mr Christell Constant who reported he was in [place] driving. LCSW called Group home and left another message with staff to contact [name] to let him know that patient is discharged. Awaiting a response back  [employer] [phone]

## 2015-12-30 ENCOUNTER — Emergency Department
Admission: EM | Admit: 2015-12-30 | Discharge: 2015-12-30 | Disposition: A | Discharge status: Home / Self Care | Payer: Medicaid Other | Attending: Emergency Medicine | Admitting: Emergency Medicine

## 2015-12-30 DIAGNOSIS — R0600 Dyspnea, unspecified: Secondary | ICD-10-CM | POA: Insufficient documentation

## 2015-12-30 DIAGNOSIS — F458 Other somatoform disorders: Secondary | ICD-10-CM

## 2015-12-30 DIAGNOSIS — R064 Hyperventilation: Secondary | ICD-10-CM | POA: Insufficient documentation

## 2015-12-30 DIAGNOSIS — Z79899 Other long term (current) drug therapy: Secondary | ICD-10-CM | POA: Diagnosis not present

## 2015-12-30 DIAGNOSIS — F410 Panic disorder [episodic paroxysmal anxiety] without agoraphobia: Secondary | ICD-10-CM | POA: Diagnosis not present

## 2015-12-30 DIAGNOSIS — R0602 Shortness of breath: Secondary | ICD-10-CM | POA: Diagnosis present
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
## 2015-12-30 NOTE — ED Notes (Addendum)
Jeralene Peters at [phone].  Informed him that pt was being DC and he said he was on his way.  Pt will be DC when caregiver arrives

## 2015-12-30 NOTE — Discharge Instructions (Signed)
Hyperventilation Hyperventilation is breathing that is deeper and more rapid than normal. It is usually associated with panic and anxiety. Hyperventilation can make you feel breathless. It is sometimes called overbreathing. Breathing out too much causes a decrease in the amount of carbon dioxide gas in the blood. This leads to tingling and numbness in the hands, feet, and around the mouth. If this continues, your fingers, hands, and toes may begin to spasm. Hyperventilation usually lasts 20-30 minutes and can be associated with other symptoms of panic and anxiety, including:   Chest pains or tightness.  A pounding or irregular, racing heartbeat (palpitations).  Dizziness.  Lightheadedness.  Dry mouth.  Weakness.  Confusion.  Sleep disturbance. CAUSES  Sudden onset (acute) hyperventilation is usually triggered by acute stress, anxiety, or emotional upset. Long-term (chronic) and recurring hyperventilation can occur with chronic lung problems, such emphysema or asthma. Other causes include:   Nervousness.  Stress.  Stimulant, drug, or alcohol use.  Lung disease.  Infections, such as pneumonia.  Heart problems.  Severe pain.  Waking from a bad dream.  Pregnancy.  Bleeding. HOME CARE INSTRUCTIONS  Learn and use breathing exercises that help you breathe from your diaphragm and abdomen.  Practice relaxation techniques to reduce stress, such as visualization, meditation, and muscle release.  During an attack, try breathing into a paper bag. This changes the carbon dioxide level and slows down breathing. SEEK IMMEDIATE MEDICAL CARE IF:  Your hyperventilation continues or gets worse. MAKE SURE YOU:  Understand these instructions.  Will watch your condition.  Will get help right away if you are not doing well or get worse.   This information is not intended to replace advice given to you by your health care provider. Make sure you discuss any questions you have with  your health care provider.   Document Released: 11/18/2000 Document Revised: 05/22/2012 Document Reviewed: 03/01/2012 Elsevier Interactive Patient Education 2016 [employer].  Shortness of Breath Shortness of breath means you have trouble breathing. It could also mean that you have a medical problem. You should get immediate medical care for shortness of breath. CAUSES   Not enough oxygen in the air such as with high altitudes or a smoke-filled room.  Certain lung diseases, infections, or problems.  Heart disease or conditions, such as angina or heart failure.  Low red blood cells (anemia).  Poor physical fitness, which can cause shortness of breath when you exercise.  Chest or back injuries or stiffness.  Being overweight.  Smoking.  Anxiety, which can make you feel like you are not getting enough air. DIAGNOSIS  Serious medical problems can often be found during your physical exam. Tests may also be done to determine why you are having shortness of breath. Tests may include:  Chest X-rays.  Lung function tests.  Blood tests.  An electrocardiogram (ECG).  An ambulatory electrocardiogram. An ambulatory ECG records your heartbeat patterns over a 24-hour period.  Exercise testing.  A transthoracic echocardiogram (TTE). During echocardiography, sound waves are used to evaluate how blood flows through your heart.  A transesophageal echocardiogram (TEE).  Imaging scans. Your health care provider may not be able to find a cause for your shortness of breath after your exam. In this case, it is important to have a follow-up exam with your health care provider as directed.  TREATMENT  Treatment for shortness of breath depends on the cause of your symptoms and can vary greatly. HOME CARE INSTRUCTIONS   Do not smoke. Smoking is a common  cause of shortness of breath. If you smoke, ask for help to quit.  Avoid being around chemicals or things that may bother your breathing,  such as paint fumes and dust.  Rest as needed. Slowly resume your usual activities.  If medicines were prescribed, take them as directed for the full length of time directed. This includes oxygen and any inhaled medicines.  Keep all follow-up appointments as directed by your health care provider. SEEK MEDICAL CARE IF:   Your condition does not improve in the time expected.  You have a hard time doing your normal activities even with rest.  You have any new symptoms. SEEK IMMEDIATE MEDICAL CARE IF:   Your shortness of breath gets worse.  You feel light-headed, faint, or develop a cough not controlled with medicines.  You start coughing up blood.  You have pain with breathing.  You have chest pain or pain in your arms, shoulders, or abdomen.  You have a fever.  You are unable to walk up stairs or exercise the way you normally do. MAKE SURE YOU:  Understand these instructions.  Will watch your condition.  Will get help right away if you are not doing well or get worse.   This information is not intended to replace advice given to you by your health care provider. Make sure you discuss any questions you have with your health care provider.   Document Released: 08/16/2001 Document Revised: 11/26/2013 Document Reviewed: 02/06/2012 Elsevier Interactive Patient Education [employer]

## 2015-12-30 NOTE — ED Provider Notes (Signed)
[facility] Emergency Department Provider Note  ____________________________________________  Time seen: 10:00 PM  I have reviewed the triage vital signs and the nursing notes.   HISTORY  Chief Complaint Panic Attack    HPI Krista [name] is a 35 y.o. female with a history of schizophrenia who comes to the ED tonight complaining of shortness of breath. EMS noted that she was hyperventilating on their arrival with bilateral arm tingling and spasming of the hands. This has happened before. Denies SI or HI or hallucinations. Feels like she has grown out of her hallucinations and that they are now under control. States that she is not happy with her group home and wants to be placed in a different group home. She was recently evaluated in the emergency department by psychiatry due to behavioral issues and found to be psychiatrically stable at that time. She is not having any of those same symptoms.     Past Medical History  Diagnosis Date  . Schizophrenia (HCC)   . Depression   . Mental retardation      Patient Active Problem List   Diagnosis Date Noted  . Herpes genitalis 12/28/2015  . Schizophrenia (HCC) 05/29/2015  . Mental retardation 05/29/2015     Past Surgical History  Procedure Laterality Date  . Cesarean section       Current Outpatient Rx  Name  Route  Sig  Dispense  Refill  . citalopram (CELEXA) 20 MG tablet   Oral   Take 1 tablet (20 mg total) by mouth daily.   7 tablet   0   . clonazePAM (KLONOPIN) 1 MG tablet   Oral   Take 1 tablet (1 mg total) by mouth at bedtime as needed for anxiety.   7 tablet   0   . divalproex (DEPAKOTE) 500 MG DR tablet   Oral   Take 1 tablet (500 mg total) by mouth 3 (three) times daily. Patient taking differently: Take 500 mg by mouth 2 (two) times daily.    7 tablet   0   . ferrous sulfate 325 (65 FE) MG tablet   Oral   Take 325 mg by mouth daily.           Marland Kitchen OLANZapine (ZYPREXA) 20  MG tablet   Oral   Take 1 tablet (20 mg total) by mouth at bedtime.   7 tablet   0   . senna (SENOKOT) 8.6 MG tablet   Oral   Take 1 tablet by mouth every evening.         . topiramate (TOPAMAX) 100 MG tablet   Oral   Take 2 tablets (200 mg total) by mouth 2 (two) times daily. Patient not taking: Reported on 11/20/2015   7 tablet   0   . valACYclovir (VALTREX) 500 MG tablet   Oral   Take 1 tablet (500 mg total) by mouth 2 (two) times daily.   14 tablet   0      Allergies Review of patient's allergies indicates no known allergies.   No family history on file.  Social History Social History  Substance Use Topics  . Smoking status: Never Smoker   . Smokeless tobacco: None  . Alcohol Use: No    Review of Systems  Constitutional:   No fever or chills. No weight changes Eyes:   No blurry vision or double vision.  ENT:   No sore throat. Cardiovascular:   No chest pain. Respiratory:   Positive shortness of  breath without cough, now resolved. Gastrointestinal:   Negative for abdominal pain, vomiting and diarrhea.  No BRBPR or melena. Genitourinary:   Negative for dysuria, urinary retention, bloody urine, or difficulty urinating. Musculoskeletal:   Negative for back pain. No joint swelling or pain. Skin:   Negative for rash. Neurological:   Negative for headaches, focal weakness or numbness. Psychiatric:  No anxiety or depression.   Endocrine:  No hot/cold intolerance, changes in energy, or sleep difficulty.  10-point ROS otherwise negative.  ____________________________________________   PHYSICAL EXAM:  VITAL SIGNS: ED Triage Vitals  Enc Vitals Group     BP 12/30/15 2118 113/70 mmHg     Pulse Rate 12/30/15 2118 71     Resp 12/30/15 2118 18     Temp 12/30/15 2118 98.1 F (36.7 C)     Temp Source 12/30/15 2118 Oral     SpO2 12/30/15 2118 100 %     Weight 12/30/15 2118 160 lb (72.576 kg)     Height 12/30/15 2118  (1.626 m)     Head Cir --       Peak Flow --      Pain Score 12/30/15 2119 8     Pain Loc --      Pain Edu? --      Excl. in GC? --     Vital signs reviewed, nursing assessments reviewed.   Constitutional:   Alert and oriented. Well appearing and in no distress. Eyes:   No scleral icterus. No conjunctival pallor. PERRL. EOMI ENT   Head:   Normocephalic and atraumatic.   Nose:   No congestion/rhinnorhea. No septal hematoma   Mouth/Throat:   MMM, no pharyngeal erythema. No peritonsillar mass. No uvula shift.   Neck:   No stridor. No SubQ emphysema. No meningismus. Hematological/Lymphatic/Immunilogical:   No cervical lymphadenopathy. Cardiovascular:   RRR. Normal and symmetric distal pulses are present in all extremities. No murmurs, rubs, or gallops. Respiratory:   Normal respiratory effort without tachypnea nor retractions. Breath sounds are clear and equal bilaterally. No wheezes/rales/rhonchi. Gastrointestinal:   Soft and nontender. No distention. There is no CVA tenderness.  No rebound, rigidity, or guarding. Genitourinary:   deferred Musculoskeletal:   Nontender with normal range of motion in all extremities. No joint effusions.  No lower extremity tenderness.  No edema. Neurologic:   Normal speech and language.  CN 2-10 normal. Motor grossly intact. No pronator drift.  Normal gait. No gross focal neurologic deficits are appreciated.  Skin:    Skin is warm, dry and intact. No rash noted.  No petechiae, purpura, or bullae. Psychiatric:   Mood and affect are normal. Speech and behavior are normal. Patient exhibits appropriate insight and judgment.  ____________________________________________    LABS (pertinent positives/negatives) (all labs ordered are listed, but only abnormal results are displayed) Labs Reviewed - No data to display ____________________________________________   EKG    ____________________________________________     RADIOLOGY    ____________________________________________   PROCEDURES   ____________________________________________   INITIAL IMPRESSION / ASSESSMENT AND PLAN / ED COURSE  Pertinent labs & imaging results that were available during my care of the patient were reviewed by me and considered in my medical decision making (see chart for details).  Patient presents with shortness of breath related to hyperventilation and EMS findings very consistent with hyperventilation and panic. The patient further supports this with her history. There also appears to be a somewhat secondary motive in that she wishes to be reassigned to a  different group home which we are not able to accommodate at this time from the emergency department. We will discharge her back home and have her follow-up with her outpatient mental health and social work for further assistance.     ____________________________________________   FINAL CLINICAL IMPRESSION(S) / ED DIAGNOSES  Final diagnoses:  Dyspnea  Hyperventilation syndrome      Sharman Cheek, MD 12/30/15 2209

## 2016-02-25 ENCOUNTER — Encounter: Payer: Self-pay | Admitting: Obstetrics and Gynecology

## 2016-02-25 ENCOUNTER — Telehealth: Payer: Self-pay | Admitting: Obstetrics and Gynecology

## 2016-02-25 ENCOUNTER — Ambulatory Visit (INDEPENDENT_AMBULATORY_CARE_PROVIDER_SITE_OTHER): Payer: Medicaid Other | Admitting: Obstetrics and Gynecology

## 2016-02-25 VITALS — BP 133/81 | HR 68 | Ht 65.0 in | Wt 158.8 lb

## 2016-02-25 DIAGNOSIS — Z30432 Encounter for removal of intrauterine contraceptive device: Secondary | ICD-10-CM

## 2016-02-25 MED ORDER — NORETHINDRONE ACET-ETHINYL EST 1.5-30 MG-MCG PO TABS: 1.0000 | ORAL_TABLET | Freq: Every day | ORAL | Status: DC

## 2016-02-25 NOTE — Patient Instructions (Signed)
1. IUD is removed 2. Begin Loestrin Fe 1.5/30 oral contraceptive on Sunday 3. Return in 3 months for follow-up

## 2016-02-25 NOTE — Progress Notes (Signed)
GYN ENCOUNTER NOTE  Subjective:       Dalia HeadingGwendolyn [name] is a 35 y.o. [id number] female is here for gynecologic evaluation of the following issues:  1. IUD removal 2. Contraception consult     Patient was referred by Dr. Dario GuardianJadali for IUD removal.  She is interested in starting oral contraceptives.  (Of note, patient has hx of schizophrenia and possible mental retardation.)  Gynecologic History Patient's last menstrual period was 11/04/2015. Contraception: IUD Last Pap: unknown. Results were: normal Last mammogram: none. Results were: N/A  Obstetric History OB History  Gravida Para Term Preterm AB SAB TAB Ectopic Multiple Living  1 1 1       1     # Outcome Date GA Lbr Len/2nd Weight Sex Delivery Anes PTL Lv  1 Term 2011    F CS-LTranv   Y      Past Medical History  Diagnosis Date  . Schizophrenia (HCC)   . Depression   . Mental retardation   . HSV infection     Past Surgical History  Procedure Laterality Date  . Cesarean section      Current Outpatient Prescriptions on File Prior to Visit  Medication Sig Dispense Refill  . citalopram (CELEXA) 20 MG tablet Take 1 tablet (20 mg total) by mouth daily. 7 tablet 0  . clonazePAM (KLONOPIN) 1 MG tablet Take 1 tablet (1 mg total) by mouth at bedtime as needed for anxiety. 7 tablet 0  . divalproex (DEPAKOTE) 500 MG DR tablet Take 1 tablet (500 mg total) by mouth 3 (three) times daily. (Patient taking differently: Take 500 mg by mouth 2 (two) times daily. ) 7 tablet 0  . ferrous sulfate 325 (65 FE) MG tablet Take 325 mg by mouth daily.      Marland Kitchen. OLANZapine (ZYPREXA) 20 MG tablet Take 1 tablet (20 mg total) by mouth at bedtime. 7 tablet 0  . senna (SENOKOT) 8.6 MG tablet Take 1 tablet by mouth every evening.    . valACYclovir (VALTREX) 500 MG tablet Take 1 tablet (500 mg total) by mouth 2 (two) times daily. 14 tablet 0   No current facility-administered medications on file prior to visit.    No Known Allergies  Social History    Social History  . Marital Status: Significant Other    Spouse Name: N/A  . Number of Children: N/A  . Years of Education: N/A   Occupational History  . Not on file.   Social History Main Topics  . Smoking status: Never Smoker   . Smokeless tobacco: Not on file  . Alcohol Use: No  . Drug Use: No  . Sexual Activity: Not Currently    Birth Control/ Protection: IUD   Other Topics Concern  . Not on file   Social History Narrative    Family History  Problem Relation Age of Onset  . Cancer Neg Hx     The following portions of the patient's history were reviewed and updated as appropriate: allergies, current medications, past family history, past medical history, past social history, past surgical history and problem list.  Review of Systems Review of Systems - General ROS: negative for - chills, fatigue, fever, hot flashes, malaise or night sweats Hematological and Lymphatic ROS: negative for - bleeding problems or swollen lymph nodes Gastrointestinal ROS: negative for - abdominal pain, blood in stools, change in bowel habits and nausea/vomiting Musculoskeletal ROS: negative for - joint pain, muscle pain or muscular weakness Genito-Urinary ROS: negative for - change in  menstrual cycle, dysmenorrhea, dyspareunia, dysuria, genital discharge, genital ulcers, hematuria, incontinence, irregular/heavy menses, nocturia or pelvic pain.  Positive for oligomenorrhea.  Objective:   BP 133/81 mmHg  Pulse 68  Ht  (1.651 m)  Wt 158 lb 12.8 oz (72.031 kg)  BMI 26.43 kg/m2  LMP 11/04/2015 CONSTITUTIONAL: Well-developed, well-nourished female in no acute distress.  PELVIC:  External Genitalia: Normal  BUS: Normal  Vagina: Normal  Cervix: Normal  Uterus: Not examined  Adnexa: Not examined  RV: Normal external exam  Bladder: Nontender  PROCEDURE: Mirena IUD removal IUD string is not visualized; cannot be identified with Q-tip, Cytobrush, or endocervical speculum. [place]  forceps was used within the endocervical canal to grasp strings with subsequent removal of IUD-uncomplicated  Assessment:   1. IUD removal 2. Contraceptive couseling; patient desires to use OCP's   Plan:   Removed IUD  Discussed contraceptive options, patient wants to start using OCP Start on Junel 1.5/30 Return for follow up in 3 months   Octavia Heir, PA-S Herold Harms, MD   I have seen, interviewed, and examined the patient in conjunction with the [facility].A. student and affirm the diagnosis and management plan. Martin A. DeFrancesco, MD, FACOG   Note: This dictation was prepared with Dragon dictation along with smaller phrase technology. Any transcriptional errors that result from this process are unintentional.     ROS

## 2016-02-25 NOTE — Telephone Encounter (Signed)
pts guarantor came back in after the appt and he stated that she needs to have the IUD placed, because the last group home she was in she got pregnant and had a baby, and she wants to have a baby but due to her mental status and her living in a group home she isn't suppose to so he needs her to have an IUD put back in because she will not take the birthcontrol and she will get pregnant.

## 2016-02-26 NOTE — Telephone Encounter (Signed)
[facility] Not [facility]. Pt has a h/o of not taking her ocp and becoming pregnant while in a group home. [name] who runs the group home pt is in DID NOT let me know this at appt. When [facility] discharge pt  Mr Ortencia KickByron was not in lobby. Darryl would like pt to be seen asap to have iud placed. Appt made for next week. Darryl and Bryron are to be present at appt.

## 2016-03-02 ENCOUNTER — Ambulatory Visit (INDEPENDENT_AMBULATORY_CARE_PROVIDER_SITE_OTHER): Payer: Medicaid Other | Admitting: Obstetrics and Gynecology

## 2016-03-02 ENCOUNTER — Encounter: Payer: Self-pay | Admitting: Obstetrics and Gynecology

## 2016-03-02 VITALS — BP 119/78 | HR 71 | Ht 65.0 in | Wt 160.2 lb

## 2016-03-02 DIAGNOSIS — Z975 Presence of (intrauterine) contraceptive device: Secondary | ICD-10-CM | POA: Insufficient documentation

## 2016-03-02 DIAGNOSIS — Z30430 Encounter for insertion of intrauterine contraceptive device: Secondary | ICD-10-CM | POA: Diagnosis not present

## 2016-03-02 NOTE — Progress Notes (Signed)
Chief complaint: 1. IUD insertion  Patient presents for IUD insertion. Previously she was given a prescription for oral contraceptives. However, recall regulations and specific issues with this patient warrant reinsertion of IUD.  IUD Insertion Procedure Note  Pre-operative Diagnosis: Contraception  Post-operative Diagnosis: same  Indications: contraception  Procedure Details  Urine pregnancy test: Negative.  The risks (including infection, bleeding, pain, and uterine perforation) and benefits of the procedure were explained to the patient and Verbal informed consent was obtained.    Cervix cleansed with Betadine. Uterus sounded to 8 cm. IUD inserted without difficulty. String visible and trimmed. Patient tolerated procedure well.  IUD Information: Lot # [id number], Expiration date 03/2019  Condition: Stable  Complications: None  Plan:  The patient was advised to call for any fever or for prolonged or severe pain or bleeding. She was advised to use OTC acetaminophen and OTC ibuprofen as needed for mild to moderate pain.   Attending Physician Documentation: Herold HarmsMartin A Denyla Cortese, MD  Note: This dictation was prepared with Dragon dictation along with smaller phrase technology. Any transcriptional errors that result from this process are unintentional.

## 2016-03-02 NOTE — Addendum Note (Signed)
Addended by: Marchelle FolksMILLER, Kymiah Araiza G on: 03/02/2016 12:31 PM   Modules accepted: Orders, Medications

## 2016-03-02 NOTE — Patient Instructions (Signed)

## 2016-03-08 ENCOUNTER — Emergency Department
Admission: EM | Admit: 2016-03-08 | Discharge: 2016-03-09 | Disposition: A | Discharge status: Other Acute Inpatient Hospital | Payer: Medicaid Other | Attending: Emergency Medicine | Admitting: Emergency Medicine

## 2016-03-08 DIAGNOSIS — F79 Unspecified intellectual disabilities: Secondary | ICD-10-CM | POA: Diagnosis not present

## 2016-03-08 DIAGNOSIS — Z23 Encounter for immunization: Secondary | ICD-10-CM | POA: Insufficient documentation

## 2016-03-08 DIAGNOSIS — Z7289 Other problems related to lifestyle: Secondary | ICD-10-CM

## 2016-03-08 DIAGNOSIS — F209 Schizophrenia, unspecified: Secondary | ICD-10-CM | POA: Diagnosis not present

## 2016-03-08 DIAGNOSIS — Z79899 Other long term (current) drug therapy: Secondary | ICD-10-CM | POA: Diagnosis not present

## 2016-03-08 DIAGNOSIS — F200 Paranoid schizophrenia: Secondary | ICD-10-CM

## 2016-03-08 DIAGNOSIS — F329 Major depressive disorder, single episode, unspecified: Secondary | ICD-10-CM | POA: Diagnosis not present

## 2016-03-08 DIAGNOSIS — R4689 Other symptoms and signs involving appearance and behavior: Secondary | ICD-10-CM

## 2016-03-08 DIAGNOSIS — R45851 Suicidal ideations: Secondary | ICD-10-CM | POA: Diagnosis present

## 2016-03-08 DIAGNOSIS — R4589 Other symptoms and signs involving emotional state: Secondary | ICD-10-CM

## 2016-03-08 LAB — CBC
HEMATOCRIT: 36.9 % (ref 35.0–47.0)
Hemoglobin: 12 g/dL (ref 12.0–16.0)
MCH: 25.4 pg — ABNORMAL LOW (ref 26.0–34.0)
MCHC: 32.4 g/dL (ref 32.0–36.0)
MCV: 78.2 fL — ABNORMAL LOW (ref 80.0–100.0)
PLATELETS: 183 10*3/uL (ref 150–440)
RBC: 4.71 MIL/uL (ref 3.80–5.20)
RDW: 15.4 % — AB (ref 11.5–14.5)
WBC: 4.7 10*3/uL (ref 3.6–11.0)

## 2016-03-08 LAB — ACETAMINOPHEN LEVEL

## 2016-03-08 LAB — COMPREHENSIVE METABOLIC PANEL
ALBUMIN: 4.7 g/dL (ref 3.5–5.0)
ALT: 9 U/L — ABNORMAL LOW (ref 14–54)
ANION GAP: 4 — AB (ref 5–15)
AST: 23 U/L (ref 15–41)
Alkaline Phosphatase: 43 U/L (ref 38–126)
BILIRUBIN TOTAL: 0.4 mg/dL (ref 0.3–1.2)
BUN: 7 mg/dL (ref 6–20)
CO2: 26 mmol/L (ref 22–32)
Calcium: 9.3 mg/dL (ref 8.9–10.3)
Chloride: 108 mmol/L (ref 101–111)
Creatinine, Ser: 0.62 mg/dL (ref 0.44–1.00)
GFR calc non Af Amer: >60 mL/min (ref >60)
GLUCOSE: 95 mg/dL (ref 65–99)
POTASSIUM: 3.8 mmol/L (ref 3.5–5.1)
SODIUM: 138 mmol/L (ref 135–145)
TOTAL PROTEIN: 8.2 g/dL — AB (ref 6.5–8.1)

## 2016-03-08 LAB — SALICYLATE LEVEL

## 2016-03-08 LAB — URINE DRUG SCREEN, QUALITATIVE (ARMC ONLY)
Amphetamines, Ur Screen: NOT DETECTED
BARBITURATES, UR SCREEN: NOT DETECTED
BENZODIAZEPINE, UR SCRN: NOT DETECTED
CANNABINOID 50 NG, UR ~~LOC~~: NOT DETECTED
Cocaine Metabolite,Ur ~~LOC~~: NOT DETECTED
MDMA (Ecstasy)Ur Screen: NOT DETECTED
METHADONE SCREEN, URINE: NOT DETECTED
Opiate, Ur Screen: NOT DETECTED
Phencyclidine (PCP) Ur S: NOT DETECTED
TRICYCLIC, UR SCREEN: NOT DETECTED

## 2016-03-08 LAB — ETHANOL: Alcohol, Ethyl (B): <5 mg/dL (ref <5)

## 2016-03-08 LAB — POCT PREGNANCY, URINE: PREG TEST UR: NEGATIVE

## 2016-03-08 MED ORDER — CLONAZEPAM 1 MG PO TABS
1.0000 mg | ORAL_TABLET | Freq: Every day | ORAL | Status: DC
  Administered 2016-03-08: 1 mg via ORAL
  Filled 2016-03-08: qty 1

## 2016-03-08 MED ORDER — TETANUS-DIPHTH-ACELL PERTUSSIS 5-2.5-18.5 LF-MCG/0.5 IM SUSP
0.5000 mL | Freq: Once | INTRAMUSCULAR | Status: AC
  Administered 2016-03-08: 0.5 mL via INTRAMUSCULAR
  Filled 2016-03-08 (×3): qty 0.5

## 2016-03-08 MED ORDER — CITALOPRAM HYDROBROMIDE 20 MG PO TABS
20.0000 mg | ORAL_TABLET | Freq: Every day | ORAL | Status: DC
  Administered 2016-03-08 – 2016-03-09 (×2): 20 mg via ORAL
  Filled 2016-03-08 (×2): qty 1

## 2016-03-08 MED ORDER — DIVALPROEX SODIUM 500 MG PO DR TAB
500.0000 mg | DELAYED_RELEASE_TABLET | Freq: Three times a day (TID) | ORAL | Status: DC
  Administered 2016-03-08 – 2016-03-09 (×2): 500 mg via ORAL
  Filled 2016-03-08 (×2): qty 1

## 2016-03-08 MED ORDER — OLANZAPINE 10 MG PO TABS
20.0000 mg | ORAL_TABLET | Freq: Every day | ORAL | Status: DC
  Administered 2016-03-08: 20 mg via ORAL
  Filled 2016-03-08: qty 2

## 2016-03-08 MED ORDER — SENNA 8.6 MG PO TABS: 1.0000 | ORAL_TABLET | Freq: Every day | ORAL | Status: DC | PRN

## 2016-03-08 MED ORDER — VALACYCLOVIR HCL 500 MG PO TABS
500.0000 mg | ORAL_TABLET | Freq: Two times a day (BID) | ORAL | Status: DC
Start: 2016-03-08 — End: 2016-03-09
  Administered 2016-03-08 – 2016-03-09 (×2): 500 mg via ORAL
  Filled 2016-03-08 (×2): qty 1

## 2016-03-08 MED ORDER — FERROUS SULFATE 325 (65 FE) MG PO TABS
325.0000 mg | ORAL_TABLET | Freq: Every day | ORAL | Status: DC
  Filled 2016-03-08 (×3): qty 1
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
## 2016-03-08 NOTE — [facility] Counselor (Signed)
Per [name] at [facility], [name] (TTS counselor) is physically located at [facility] and doing assessments until 12am. She can be reached at [phone].  - Hennesy Sobalvarro, LPC   [facility] 

## 2016-03-08 NOTE — ED Notes (Signed)
Pt given supper tray.

## 2016-03-08 NOTE — ED Notes (Addendum)
Patient endorsing [facility] of "colors swirling on the wall."
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
## 2016-03-08 NOTE — ED Notes (Signed)
IVC/ Pt moved to [id number]
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
## 2016-03-09 ENCOUNTER — Inpatient Hospital Stay
Admission: EM | Admit: 2016-03-09 | Discharge: 2016-03-14 | DRG: 885 | Disposition: A | Discharge status: Home / Self Care | Payer: Medicaid Other | Source: Intra-hospital | Attending: Psychiatry | Admitting: Psychiatry

## 2016-03-09 ENCOUNTER — Encounter: Payer: Self-pay | Admitting: Psychiatry

## 2016-03-09 DIAGNOSIS — F79 Unspecified intellectual disabilities: Secondary | ICD-10-CM

## 2016-03-09 DIAGNOSIS — R45851 Suicidal ideations: Secondary | ICD-10-CM | POA: Diagnosis present

## 2016-03-09 DIAGNOSIS — F419 Anxiety disorder, unspecified: Secondary | ICD-10-CM | POA: Diagnosis present

## 2016-03-09 DIAGNOSIS — D649 Anemia, unspecified: Secondary | ICD-10-CM | POA: Diagnosis present

## 2016-03-09 DIAGNOSIS — Z915 Personal history of self-harm: Secondary | ICD-10-CM

## 2016-03-09 DIAGNOSIS — F209 Schizophrenia, unspecified: Secondary | ICD-10-CM | POA: Diagnosis present

## 2016-03-09 DIAGNOSIS — Z9119 Patient's noncompliance with other medical treatment and regimen: Secondary | ICD-10-CM

## 2016-03-09 DIAGNOSIS — K5900 Constipation, unspecified: Secondary | ICD-10-CM | POA: Diagnosis present

## 2016-03-09 DIAGNOSIS — F251 Schizoaffective disorder, depressive type: Principal | ICD-10-CM | POA: Diagnosis present

## 2016-03-09 DIAGNOSIS — A6000 Herpesviral infection of urogenital system, unspecified: Secondary | ICD-10-CM | POA: Diagnosis present

## 2016-03-09 DIAGNOSIS — I4581 Long QT syndrome: Secondary | ICD-10-CM | POA: Diagnosis present

## 2016-03-09 DIAGNOSIS — R4589 Other symptoms and signs involving emotional state: Secondary | ICD-10-CM | POA: Diagnosis present

## 2016-03-09 DIAGNOSIS — R4689 Other symptoms and signs involving appearance and behavior: Secondary | ICD-10-CM

## 2016-03-09 HISTORY — DX: Anxiety disorder, unspecified: F41.9

## 2016-03-09 LAB — TSH: TSH: 0.651 u[IU]/mL (ref 0.350–4.500)

## 2016-03-09 LAB — VALPROIC ACID LEVEL: VALPROIC ACID LVL: 65 ug/mL (ref 50.0–100.0)

## 2016-03-09 LAB — LIPID PANEL
Cholesterol: 175 mg/dL (ref 0–200)
HDL: 56 mg/dL (ref >40)
LDL CALC: 101 mg/dL — AB (ref 0–99)
TRIGLYCERIDES: 88 mg/dL (ref <150)
Total CHOL/HDL Ratio: 3.1 RATIO
VLDL: 18 mg/dL (ref 0–40)

## 2016-03-09 MED ORDER — OLANZAPINE 10 MG PO TABS
20.0000 mg | ORAL_TABLET | Freq: Every day | ORAL | Status: DC
  Administered 2016-03-09 – 2016-03-13 (×5): 20 mg via ORAL
  Filled 2016-03-09 (×5): qty 2

## 2016-03-09 MED ORDER — CLONAZEPAM 1 MG PO TABS
1.0000 mg | ORAL_TABLET | Freq: Every day | ORAL | Status: DC
Start: 2016-03-09 — End: 2016-03-14
  Administered 2016-03-09 – 2016-03-13 (×5): 1 mg via ORAL
  Filled 2016-03-09 (×6): qty 1

## 2016-03-09 MED ORDER — VALACYCLOVIR HCL 500 MG PO TABS
500.0000 mg | ORAL_TABLET | Freq: Two times a day (BID) | ORAL | Status: DC
  Administered 2016-03-09 – 2016-03-14 (×10): 500 mg via ORAL
  Filled 2016-03-09 (×13): qty 1

## 2016-03-09 MED ORDER — ALUM & MAG HYDROXIDE-SIMETH 200-200-20 MG/5ML PO SUSP: 30.0000 mL | ORAL | Status: DC | PRN

## 2016-03-09 MED ORDER — FERROUS SULFATE 325 (65 FE) MG PO TABS
325.0000 mg | ORAL_TABLET | Freq: Every day | ORAL | Status: DC
Start: 2016-03-10 — End: 2016-03-14
  Administered 2016-03-10 – 2016-03-14 (×5): 325 mg via ORAL
  Filled 2016-03-09 (×5): qty 1

## 2016-03-09 MED ORDER — SENNA 8.6 MG PO TABS: 1.0000 | ORAL_TABLET | Freq: Every day | ORAL | Status: DC | PRN

## 2016-03-09 MED ORDER — CITALOPRAM HYDROBROMIDE 20 MG PO TABS
20.0000 mg | ORAL_TABLET | Freq: Every day | ORAL | Status: DC
  Administered 2016-03-10 – 2016-03-14 (×5): 20 mg via ORAL
  Filled 2016-03-09 (×5): qty 1

## 2016-03-09 MED ORDER — MAGNESIUM HYDROXIDE 400 MG/5ML PO SUSP: 30.0000 mL | Freq: Every day | ORAL | Status: DC | PRN

## 2016-03-09 MED ORDER — ACETAMINOPHEN 325 MG PO TABS
650.0000 mg | ORAL_TABLET | Freq: Four times a day (QID) | ORAL | Status: DC | PRN
  Filled 2016-03-09: qty 2

## 2016-03-09 MED ORDER — DIVALPROEX SODIUM 500 MG PO DR TAB
500.0000 mg | DELAYED_RELEASE_TABLET | Freq: Three times a day (TID) | ORAL | Status: DC
  Administered 2016-03-09 – 2016-03-14 (×16): 500 mg via ORAL
  Filled 2016-03-09 (×16): qty 1

## 2016-03-09 NOTE — ED Notes (Signed)
Patient noted in room. No complaints, stable, in no acute distress. Q15 minute rounds and monitoring via Security Cameras to continue.  

## 2016-03-09 NOTE — ED Provider Notes (Signed)
-----------------------------------------   6:49 AM on 03/09/2016 -----------------------------------------   Blood pressure 100/61, pulse 57, temperature 98 F (36.7 C), temperature source Oral, resp. rate 18, last menstrual period 11/04/2015, SpO2 100 %.  The patient had no acute events since last update.  Calm and cooperative at this time.  Disposition is pending per Psychiatry/Behavioral Medicine team recommendations.     Irean HongJade J Pinkney Venard, MD 03/09/16 [phone]

## 2016-03-09 NOTE — ED Notes (Signed)
Pt is pleasant in conversation she is aware she is being adm to hospital still c/o depression with si but does contract for safety here in bhu

## 2016-03-09 NOTE — ED Provider Notes (Signed)
Progress note 11:33 AM 03/09/2016 Patient was accepted to our psych unit for admission.  Leona CarryLinda M Alixander Rallis, MD 03/09/16 [phone]

## 2016-03-09 NOTE — ED Notes (Signed)
Admitted Inpt Beh.Unit

## 2016-03-09 NOTE — [facility] Group Notes (Signed)
[facility] Group Notes:  (Nursing/MHT/Case Management/Adjunct)  Date:  03/09/2016  Time:  4:14 PM  Type of Therapy:  Psychoeducational Skills  Participation Level:  Active  Participation Quality:  Appropriate  Affect:  Appropriate  Cognitive:  Appropriate  Insight:  Appropriate  Engagement in Group:  Engaged  Modes of Intervention:  Discussion and Education  Summary of Progress/Problems:  [name] 03/09/2016, 4:14 PM

## 2016-03-09 NOTE — Progress Notes (Signed)
35 year old female received on unit under IVC with schizoaffective disorder.  Patient arrived from ED in scrubs, pleasant and cooperative.  Denies SI at this time.  Verbalized that she just got so mad that she just started jabbing herself with a pencil.   Body search and skin assessment performed.  No contraband found.  Left forearm has 4 small lacerations that are in beginning stages of healing.

## 2016-03-09 NOTE — BH Assessment (Signed)
[facility] Toni Amendlapacs.  Attending Physician will be Dr. Jennet MaduroPucilowska.   Patient [facility] Victorino DikeJennifer.   Intake Paper Work has been signed and placed on patient chart.  ER staff is aware of the admission Rivka Barbara(Glenda, ER Sect.; Dr. Ladona Ridgelaylor, ER MD; Windell Mouldinguth Patient's Nurse & Byrd HesselbachMaria, Patient Access).

## 2016-03-09 NOTE — Tx Team (Signed)
Initial Interdisciplinary Treatment Plan   PATIENT STRESSORS: Medication change or noncompliance   PATIENT STRENGTHS: Average or above average intelligence Motivation for treatment/growth Physical Health   PROBLEM LIST: Problem List/Patient Goals Date to be addressed Date deferred Reason deferred Estimated date of resolution  "learn to manage my anger"      Develop coping skills                                                 DISCHARGE CRITERIA:  Adequate post-discharge living arrangements Improved stabilization in mood, thinking, and/or behavior Reduction of life-threatening or endangering symptoms to within safe limits  PRELIMINARY DISCHARGE PLAN: Return to previous living arrangement  PATIENT/FAMIILY INVOLVEMENT: This treatment plan has been presented to and reviewed with the patient, Dalia HeadingGwendolyn Englert, and/or family member, .  The patient and family have been given the opportunity to ask questions and make suggestions.  Dhriti Fales B 03/09/2016, 5:27 PM

## 2016-03-09 NOTE — ED Notes (Signed)
Admitted Inpt Beh.Unit 

## 2016-03-09 NOTE — [facility] Group Notes (Signed)
[facility] LCSW Group Therapy  03/09/2016 3:07 PM  Type of Therapy:  Group Therapy  Participation Level:  Did Not Attend  Modes of Intervention:  Discussion, Education, Socialization and Support  Summary of Progress/Problems: Emotional Regulation: Patients will identify both negative and positive emotions. They will discuss emotions they have difficulty regulating and how they impact their lives. Patients will be asked to identify healthy coping skills to combat unhealthy reactions to negative emotions.     Juno Bozard L Noelie Renfrow MSW, LCSWA  03/09/2016, 3:07 PM  

## 2016-03-10 LAB — HEMOGLOBIN A1C: Hgb A1c MFr Bld: 5.3 % (ref 4.0–6.0)

## 2016-03-10 LAB — PROLACTIN: PROLACTIN: 26.3 ng/mL — AB (ref 4.8–23.3)
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
## 2016-03-10 NOTE — Tx Team (Signed)
Interdisciplinary Treatment Plan Update (Adult)  Date:  03/10/2016 Time Reviewed:  1:59 PM  Progress in Treatment: Attending groups: Yes. Participating in groups:  Yes. Taking medication as prescribed:  Yes. Tolerating medication:  Yes. Family/Significant othe contact made:  Yes, individual(s) contacted:  group home owner [name] and left message with guardian Carlena Bjornstad Patient understands diagnosis:  Yes. Discussing patient identified problems/goals with staff:  Yes. Medical problems stabilized or resolved:  Yes. Denies suicidal/homicidal ideation: Yes. Issues/concerns per patient self-inventory:  Yes. Other:  New problem(s) identified: No, Describe:  none reported  Discharge Plan or Barriers: Patient will stabilize on medication and discharge back to group home with outpatient [facility].  Reason for Continuation of Hospitalization: Depression Medication stabilization Suicidal ideation  Comments:  Estimated length of stay: up to 4 days, expected discharge by Monday 03/14/16  New goal(s):  Review of initial/current patient goals per problem list:   1.  Goal(s): Participate in aftercare plan    Met:  Yes  Target date: by discharge  As evidenced by: patient will participate in aftercare plan AEB aftercare provider and housing plan identified at discharge 03/10/16: Patient will return to group home once stabilized on medications and will follow up with her outpatient mental health provider. Goal met.   2.  Goal (s): Decrease depression    Met:  No  Target date: by discharge  As evidenced by: patient demonstrates decreased symptoms of depression and reports a Depression rating of 3 or less 03/10/16: Patient was admitted for self injury to arm in group home and will be restarted on medication.     Attendees: Patient:  Krista [name] 4/6/20171:59 PM  Physician:  Orson Slick, MD 4/6/20171:59 PM  Nursing:   Elige Radon, RN 4/6/20171:59  PM  Other:  Carmell Austria, LCSW 4/6/20171:59 PM  Other:  Everitt Amber, LRT 4/6/20171:59 PM  Other:  Abran Cantor, RN 4/6/20171:59 PM  Other:  4/6/20171:59 PM  Other:  4/6/20171:59 PM  Other:  4/6/20171:59 PM  Other:  4/6/20171:59 PM  Other:  4/6/20171:59 PM  Other:  4/6/20171:59 PM  Other:   4/6/20171:59 PM   Scribe for Treatment Team:   Keene Breath, MSW, LCSW  03/10/2016, 1:59 PM

## 2016-03-10 NOTE — B[facility] Group Notes (Signed)
[facility] LCSW Group Therapy  03/10/2016 3:47 PM  Type of Therapy:  Group Therapy  Participation Level: Active   Participation Quality:  Attentive   Affect:  Flat    Cognitive:  Alert   Insight:  Limited   Engagement in Therapy: Limited   Modes of Intervention:  Discussion, Education, Socialization and Support  Summary of Progress/Problems: Balance in life: Patients will discuss the concept of balance and how it looks and feels to be unbalanced. Pt will identify areas in their life that is unbalanced and ways to become more balanced. Pt attended group and stayed the entire time. She discussed becoming upset at her group home because she does not tell people what is on her mind. She states she holds it in until she has an outburst. She states she does not believe telling people how she feels will be beneficial.    Rondall Allegraandace L Dayshon Roback MSW, LCSWA  03/10/2016, 3:47 PM

## 2016-03-10 NOTE — B[facility] Group Notes (Signed)
[facility] Group Notes:  (Nursing/MHT/Case Management/Adjunct)  Date:  03/10/2016  Time:  3:57 PM  Type of Therapy:  Movement Therapy  Participation Level:  Active  Participation Quality:  Appropriate and Attentive  Affect:  Appropriate  Cognitive:  Alert  Insight:  Limited  Engagement in Group:  Engaged  Modes of Intervention:  Activity and Discussion  Summary of Progress/Problems:  Krista [name] Krista [name] 03/10/2016, 3:57 PM

## 2016-03-10 NOTE — Progress Notes (Signed)
Recreation Therapy Notes  Date: 04.06.17 Time: 9:30 am Location: Craft Room  Group Topic: Leisure Education  Goal Area(s) Addresses:  Patient will identify activities for each letter of the alphabet.  Patient will verbalize one emotion felt while participating in leisure activities.  Behavioral Response: Attentive, Interactive  Intervention: Leisure Alphabet  Activity: Patients were given a leisure Information systems manageralphabet worksheet. LRT and patients went through worksheet together naming healthy leisure activities for each letter of the alphabet.  Education: LRT educated patients on using leisure as a Associate Professorcoping skill.  Education Outcome: Acknowledges education/In group clarification offered  Clinical Observations/Feedback: Patient completed activity by writing down leisure activities. Patient contributed to group by stating some leisure activities. Patient contributed to group discussion by stating what kind of emotions she feels when she participates in leisure activities and that leisure activities help her to relieve stress.  Jacquelynn CreeGreene,Hadia Minier M, LRT/CTRS 03/10/2016 10:14 AM

## 2016-03-10 NOTE — Plan of Care (Signed)
Problem: Ineffective individual coping Goal: LTG: Patient will report a decrease in negative feelings Outcome: Progressing Verbalizes that she feels much better today.
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
## 2016-03-10 NOTE — H&P (Signed)
Psychiatric Admission Assessment Adult  Patient Identification: Anju Sereno MRN:  [id number] Date of Evaluation:  03/10/2016 Chief Complaint:  Schizophrenia Principal Diagnosis: Schizoaffective disorder, depressive type (HCC) Diagnosis:   Patient Active Problem List   Diagnosis Date Noted  . Suicidal behavior [F48.9] 03/08/2016  . IUD contraception [Z97.5] 03/02/2016  . Herpes genitalis [A60.00] 12/28/2015  . Borderline personality disorder [F60.3] 09/16/2015  . Intellectual disability [F79] 09/16/2015  . Schizoaffective disorder, depressive type (HCC) [F25.1] 09/16/2015   History of Present Illness:  Identifying data. Ms. Kimble is a 35 year old female with history of schizoaffective disorder.  Chief complaint. "I always get depressed when I stopped my medications."  History of present illness. Information was obtained from the patient and the chart. The patient reports that she became increasingly depressed with poor sleep and decreased appetite, feeling of guilt and hopelessness worthlessness, poor energy and concentration, social isolation, crying spells and suicidal thinking over past few days en she was off her prescribed medication. In spite of the fact that the patient lives in group home she had no access to medication because the pharmacy did not send them on time. The patient points out that every time she is off medications she becomes depressed and suicidal. This time she started stabbing herself with a coloring pencil and scratching her forearm. In spite of her complaints, Depakote level was therapeutic indicating that she has been taking medication. The patient denies any symptoms of anxiety, psychosis, or symptoms suggestive of bipolar mania. She points out that in the past she did much better on Haldol and is asking if he could be restarted. She does not know why it was discontinued in the first place. Our records indicate that she has not been on Haldol at least since  2015. There is no alcohol or substances involved.  PAST PSYCHIATRIC HISTORY: She has had multiple admissions in the past in [place] and in [place] area with longstanding diagnosis of schizoaffective disorder. She has a history of suicide attempts, self-mutilation, and some agitation.  She has been tried on multiple medications. She does not remember names other than Haldol that she liked the most.  FAMILY PSYCHIATRIC HISTORY: None reported.   SOCIAL HISTORY:  she graduated in high school in special education classes. She has a daughter who lives in [place] with her patient's parents. She is disabled from mental illness. She has a guardian trough [employer] in [place] [phone]. She was charged in the past with destruction of property. There are no current legal charges pending.   Total Time spent with patient: 1 hour  Past Psychiatric History: Schizoaffective disorder.  Is the patient at risk to self? Yes.    Has the patient been a risk to self in the past 6 months? Yes.    Has the patient been a risk to self within the distant past? Yes.    Is the patient a risk to others? Yes.    Has the patient been a risk to others in the past 6 months? Yes.    Has the patient been a risk to others within the distant past? Yes.     Prior Inpatient Therapy:   Prior Outpatient Therapy:    Alcohol Screening: 1. How often do you have a drink containing alcohol?: Never 2. How many drinks containing alcohol do you have on a typical day when you are drinking?: 1 or 2 3. How often do you have six or more drinks on one occasion?: Never Preliminary Score: 0 4. How often  during the last year have you found that you were not able to stop drinking once you had started?: Never 5. How often during the last year have you failed to do what was normally expected from you becasue of drinking?: Never 6. How often during the last year have you needed a first drink in the morning to get yourself going after a heavy  drinking session?: Never 7. How often during the last year have you had a feeling of guilt of remorse after drinking?: Never 8. How often during the last year have you been unable to remember what happened the night before because you had been drinking?: Never 9. Have you or someone else been injured as a result of your drinking?: No 10. Has a relative or friend or a doctor or another health worker been concerned about your drinking or suggested you cut down?: No Alcohol Use Disorder Identification Test Final Score (AUDIT): 0 Brief Intervention: AUDIT score less than 7 or less-screening does not suggest unhealthy drinking-brief intervention not indicated Substance Abuse History in the last 12 months:  No. Consequences of Substance Abuse: NA Previous Psychotropic Medications: Yes  Psychological Evaluations: No  Past Medical History:  Past Medical History  Diagnosis Date  . Schizophrenia (HCC)   . Depression   . Mental retardation   . HSV infection   . Anxiety     Past Surgical History  Procedure Laterality Date  . Cesarean section     Family History:  Family History  Problem Relation Age of Onset  . Cancer Neg Hx    Family Psychiatric  History: Unknown. Tobacco Screening: @FLOW [phone])::1)@ Social History:  History  Alcohol Use No     History  Drug Use No    Additional Social History: Marital status: Single What is your sexual orientation?: heterosexual Does patient have children?: Yes How many children?: 1 How is patient's relationship with their children?: 35 year old lives with mom and step dad                         Allergies:  No Known Allergies Lab Results:  Results for orders placed or performed during the hospital encounter of 03/09/16 (from the past 48 hour(s))  Valproic acid level     Status: None   Collection Time: 03/09/16  3:49 PM  Result Value Ref Range   Valproic Acid Lvl 65 50.0 - 100.0 ug/mL  Hemoglobin A1c     Status: None    Collection Time: 03/09/16  3:49 PM  Result Value Ref Range   Hgb A1c MFr Bld 5.3 4.0 - 6.0 %  Lipid panel, fasting     Status: Abnormal   Collection Time: 03/09/16  3:49 PM  Result Value Ref Range   Cholesterol 175 0 - 200 mg/dL   Triglycerides 88 <098<150 mg/dL   HDL 56 >11>40 mg/dL   Total CHOL/HDL Ratio 3.1 RATIO   VLDL 18 0 - 40 mg/dL   LDL Cholesterol 914101 (H) 0 - 99 mg/dL    Comment:        Total Cholesterol/HDL:CHD Risk Coronary Heart Disease Risk Table                     Men   Women  1/2 Average Risk   3.4   3.3  Average Risk       5.0   4.4  2 X Average Risk   9.6   7.1  3 X  Average Risk  23.4   11.0        Use the calculated Patient Ratio above and the CHD Risk Table to determine the patient's CHD Risk.        ATP III CLASSIFICATION (LDL):  <100     mg/dL   Optimal  161-096  mg/dL   Near or Above                    Optimal  130-159  mg/dL   Borderline  045-409  mg/dL   High  >811     mg/dL   Very High   Prolactin     Status: Abnormal   Collection Time: 03/09/16  3:49 PM  Result Value Ref Range   Prolactin 26.3 (H) 4.8 - 23.3 ng/mL    Comment: (NOTE) Performed At: [name] [address] [place] [facility] [place], [place] [id number] Mila Homer MD OZ:[id number]   TSH     Status: None   Collection Time: 03/09/16  3:49 PM  Result Value Ref Range   TSH 0.651 0.350 - 4.500 uIU/mL    Blood Alcohol level:  Lab Results  Component Value Date   [facility] <5 03/08/2016   ETH <5 12/27/2015    Metabolic Disorder Labs:  Lab Results  Component Value Date   HGBA1C 5.3 03/09/2016   Lab Results  Component Value Date   PROLACTIN 26.3* 03/09/2016   Lab Results  Component Value Date   CHOL 175 03/09/2016   TRIG 88 03/09/2016   HDL 56 03/09/2016   CHOLHDL 3.1 03/09/2016   VLDL 18 03/09/2016   LDLCALC 101* 03/09/2016   LDLCALC 103* 08/08/2014    Current Medications: Current Facility-Administered Medications  Medication Dose Route Frequency Provider Last Rate Last  Dose  . acetaminophen (TYLENOL) tablet 650 mg  650 mg Oral Q6H PRN Audery Amel, MD      . alum & mag hydroxide-simeth (MAALOX/MYLANTA) 200-200-20 MG/5ML suspension 30 mL  30 mL Oral Q4H PRN Audery Amel, MD      . citalopram (CELEXA) tablet 20 mg  20 mg Oral Daily Audery Amel, MD   20 mg at 03/10/16 1010  . clonazePAM (KLONOPIN) tablet 1 mg  1 mg Oral QHS Audery Amel, MD   1 mg at 03/09/16 2226  . divalproex (DEPAKOTE) DR tablet 500 mg  500 mg Oral 3 times per day Audery Amel, MD   500 mg at 03/10/16 0651  . ferrous sulfate tablet 325 mg  325 mg Oral Q breakfast Audery Amel, MD   325 mg at 03/10/16 1010  . magnesium hydroxide (MILK OF MAGNESIA) suspension 30 mL  30 mL Oral Daily PRN Audery Amel, MD      . OLANZapine (ZYPREXA) tablet 20 mg  20 mg Oral QHS Audery Amel, MD   20 mg at 03/09/16 2225  . senna (SENOKOT) tablet 8.6 mg  1 tablet Oral Daily PRN Audery Amel, MD      . valACYclovir (VALTREX) tablet 500 mg  500 mg Oral BID Audery Amel, MD   500 mg at 03/10/16 1010   PTA Medications: Prescriptions prior to admission  Medication Sig Dispense Refill Last Dose  . clonazePAM (KLONOPIN) 1 MG tablet Take 1 mg by mouth at bedtime. Pt also takes three times daily as needed for agitation.   unknown at unknown  . divalproex (DEPAKOTE) 500 MG DR tablet Take 500 mg by mouth 2 (two) times daily.  unknown at unknown  . ferrous sulfate 325 (65 FE) MG tablet Take 325 mg by mouth daily with breakfast.    unknown at unknown  . OLANZapine (ZYPREXA) 20 MG tablet Take 1 tablet (20 mg total) by mouth at bedtime. 7 tablet 0 unknown at unknown  . senna (SENOKOT) 8.6 MG tablet Take 1 tablet by mouth at bedtime.    unknown at unknown  . valACYclovir (VALTREX) 500 MG tablet Take 1 tablet (500 mg total) by mouth 2 (two) times daily. 14 tablet 0 unknown at unknown    Musculoskeletal: Strength & Muscle Tone: within normal limits Gait & Station: normal Patient leans: N/A  Psychiatric  Specialty Exam: Physical Exam  Nursing note and vitals reviewed. Constitutional: She is oriented to person, place, and time. She appears well-developed and well-nourished.  HENT:  Head: Normocephalic and atraumatic.  Eyes: Conjunctivae and EOM are normal. Pupils are equal, round, and reactive to light.  Neck: Normal range of motion. Neck supple.  Cardiovascular: Normal rate, regular rhythm and normal heart sounds.   Respiratory: Effort normal and breath sounds normal.  GI: Soft. Bowel sounds are normal.  Musculoskeletal: Normal range of motion.  Neurological: She is oriented to person, place, and time.  Skin: Skin is warm and dry.    Review of Systems  Psychiatric/Behavioral: Positive for depression and suicidal ideas.  All other systems reviewed and are negative.   Blood pressure 111/69, pulse 82, temperature 99 F (37.2 C), temperature source Oral, resp. rate 18, height  (1.651 m), weight 68.947 kg (152 lb), last menstrual period 11/04/2015, SpO2 100 %.Body mass index is 25.29 kg/(m^2).  See SRA.                                                  Sleep:  Number of Hours: 7.3     Treatment Plan Summary: Daily contact with patient to assess and evaluate symptoms and progress in treatment and Medication management   Ms. Doble is a 35 year old female with history of schizoaffective disorder admitted after a suicidal gesture by stabbing herself with a pencil in the context of treatment noncompliance caused by delay in delivery of her regular medications.  1. Suicidal ideation. The patient is able to contract for safety in the hospital.  2. Mood. She has been maintained on a combination of Depakote and Zyprexa for mood stabilization, and Celexa and clonazepam for depression and anxiety. Depakote level was therapeutic on admission. The patient reports that she did very well on Haldol and would like to be restarted on it.  3. Anemia. She is on iron  pills.  4. Constipation. She is on Senna.  5. Genital herpes. She is on Valtrex.  6. Social. She is incompetent adult and has a guardian.   7. Disposition. She will be discharged back to her group home and follow up with her regular psychiatrist.   Observation Level/Precautions:  15 minute checks  Laboratory:  CBC Chemistry Profile UDS UA  Psychotherapy:    Medications:    Consultations:    Discharge Concerns:    Estimated LOS:  Other:     I certify that inpatient services furnished can reasonably be expected to improve the patient's condition.    [name], MD 4/6/20172:33 PM

## 2016-03-10 NOTE — Progress Notes (Signed)
D: Pt in room upon approach. Affect depressed, mood is anxious. Denies SI at this time. Endorses AH that frighten the patient. Pt stated " I feel bad about my behavior at the group home" She stated she would like to return. Worried about being homeless if she cannot return. Denies pain. Medication compliant. Appropriate with staff and peers. A: Encouragement and support provided. Q15 minute checks maintained for safety. Medications given as prescribed. R: Remains safe on unit. Voices no other concerns at this time. Will continue to monitor.

## 2016-03-10 NOTE — [facility] Group Notes (Signed)
[facility] Group Notes:  (Nursing/MHT/Case Management/Adjunct)  Date:  03/10/2016  Time:  5:03 AM  Type of Therapy:  Group Therapy  Participation Level:  Minimal  Participation Quality:  Attentive  Affect:  Flat  Cognitive:  Alert  Insight:  Limited  Engagement in Group:  Limited  Modes of Intervention:  n/a  Summary of Progress/Problems:  Krista [name] Krista [name] 03/10/2016, 5:03 AM

## 2016-03-10 NOTE — [facility] Group Notes (Signed)
[facility] Group Notes:  (Nursing/MHT/Case Management/Adjunct)  Date:  03/10/2016  Time:  11:50 PM  Type of Therapy:  Evening Wrap-up Group  Participation Level:  Active  Participation Quality:  Appropriate and Attentive  Affect:  Appropriate  Cognitive:  Alert and Appropriate  Insight:  Appropriate, Good and Improving  Engagement in Group:  Engaged and Improving  Modes of Intervention:  Discussion  Summary of Progress/Problems:   Krista MorrowChelsea [name] Krista [name] 03/10/2016, 11:50 PM

## 2016-03-10 NOTE — [facility] Suicide Risk Assessment (Signed)
[facility]   Nursing information obtained from:  Patient Demographic factors:    Current Mental Status:  Self-harm behaviors Loss Factors:    Historical Factors:  NA Risk Reduction Factors:  Positive social support  Total Time spent with patient: 1 hour Principal Problem: Schizoaffective disorder, depressive type (HCC) Diagnosis:   Patient Active Problem List   Diagnosis Date Noted  . Suicidal behavior [F48.9] 03/08/2016  . IUD contraception [Z97.5] 03/02/2016  . Herpes genitalis [A60.00] 12/28/2015  . Borderline personality disorder [F60.3] 09/16/2015  . Intellectual disability [F79] 09/16/2015  . Schizoaffective disorder, depressive type (HCC) [F25.1] 09/16/2015   Subjective Data: Depression, suicidal ideation.  Continued Clinical Symptoms:  Alcohol Use Disorder Identification Test Final Score (AUDIT): 0 The "Alcohol Use Disorders Identification Test", Guidelines for Use in Primary Care, Second Edition.  World Science writerHealth Organization [facility](WHO). Score between 0-7:  no or low risk or alcohol related problems. Score between 8-15:  moderate risk of alcohol related problems. Score between 16-19:  high risk of alcohol related problems. Score 20 or above:  warrants further diagnostic evaluation for alcohol dependence and treatment.   CLINICAL FACTORS:   Schizophrenia:   Depressive state Less than 35 years old   Musculoskeletal: Strength & Muscle Tone: within normal limits Gait & Station: normal Patient leans: N/A  Psychiatric Specialty Exam: Review of Systems  Psychiatric/Behavioral: Positive for depression and suicidal ideas.  All other systems reviewed and are negative.   Blood pressure 111/69, pulse 82, temperature 99 F (37.2 C), temperature source Oral, resp. rate 18, height 5\' 5"  (1.651 m), weight 68.947 kg (152 lb), last menstrual period 11/04/2015, SpO2 100 %.Body mass index is 25.29 kg/(m^2).  General Appearance: Fairly Groomed  Patent attorneyye Contact::   Good  Speech:  Clear and Coherent  Volume:  Normal  Mood:  Anxious  Affect:  Appropriate  Thought Process:  Goal Directed  Orientation:  Full (Time, Place, and Person)  Thought Content:  WDL  Suicidal Thoughts:  Yes.  with intent/plan  Homicidal Thoughts:  No  Memory:  Immediate;   Fair Recent;   Fair Remote;   Fair  Judgement:  Impaired  Insight:  Shallow  Psychomotor Activity:  Normal  Concentration:  Fair  Recall:  FiservFair  Fund of Knowledge:Fair  Language: Fair  Akathisia:  No  Handed:  Right  AIMS (if indicated):     Assets:  Communication Skills Desire for Improvement Financial Resources/Insurance Housing Physical Health Resilience  Sleep:  Number of Hours: 7.3  Cognition: WNL  ADL's:  Intact    COGNITIVE FEATURES THAT CONTRIBUTE TO RISK:  None    SUICIDE RISK:   Moderate:  Frequent suicidal ideation with limited intensity, and duration, some specificity in terms of plans, no associated intent, good self-control, limited dysphoria/symptomatology, some risk factors present, and identifiable protective factors, including available and accessible social support.  PLAN OF CARE: Hospital admission, medication management, discharge planning.  Krista [name] is a 35 year old female with history of schizoaffective disorder admitted after a suicidal gesture by stabbing herself with a pencil in the context of treatment noncompliance caused by delay in delivery of her regular medications.  1. Suicidal ideation. The patient is able to contract for safety in the hospital.  2. Mood. She has been maintained on a combination of Depakote and Zyprexa for mood stabilization, and Celexa and clonazepam for depression and anxiety. Depakote level was therapeutic on admission. The patient reports that she did very well on Haldol and would like to be  restarted on it.  3. Anemia. She is on iron pills.  4. Constipation. She is on Senna.  5. Genital herpes. She is on Valtrex.  6. Social.  She is incompetent adult and has a guardian.   7. Disposition. She will be discharged back to her group home and follow up with her regular psychiatrist.    I certify that inpatient services furnished can reasonably be expected to improve the patient's condition.   [name], MD 03/10/2016, 2:26 PM

## 2016-03-10 NOTE — Progress Notes (Signed)
D:  Denies AVH this shift.  Verbalized that she was having them last night.  Denies SI/HI.   States that she is feeling better and is ready to return home.   A:  Medication given according to schedule. Safety maintained.  Support and encouragement offered.  R:  Medication and group compliant.

## 2016-03-10 NOTE — [facility] Suicide Risk Assessment (Signed)
[facility] INPATIENT:  Family/Significant Other Suicide Prevention Education  Suicide Prevention Education:  Education Completed; [name] (group home director) [phone] has been identified by the patient as the family memberClinton Sawyer/significant other with whom the patient will be residing, and identified as the person(s) who will aid the patient in the event of a mental health crisis (suicidal ideations/suicide attempt).  With written consent from the patient, the family member/significant other has been provided the following suicide prevention education, prior to the and/or following the discharge of the patient.  The suicide prevention education provided includes the following:  Suicide risk factors  Suicide prevention and interventions  National Suicide Hotline telephone number  [facility] assessment telephone number  [facility] [place] [place] Emergency Assistance 911  [facility] and/or Residential Mobile Crisis Unit telephone number  Request made of family/significant other to:  Remove weapons (e.g., guns, rifles, knives), all items previously/currently identified as safety concern.    Remove drugs/medications (over-the-counter, prescriptions, illicit drugs), all items previously/currently identified as a safety concern.  The family member/significant other verbalizes understanding of the suicide prevention education information provided.  The family member/significant other agrees to remove the items of safety concern listed above.  Lulu RidingIngle, Tiye Huwe T, MSW, LCSW 03/10/2016, 1:58 PM

## 2016-03-11 MED ORDER — HALOPERIDOL 5 MG PO TABS
5.0000 mg | ORAL_TABLET | Freq: Every day | ORAL | Status: DC
  Administered 2016-03-11 – 2016-03-13 (×3): 5 mg via ORAL
  Filled 2016-03-11 (×2): qty 1
  Filled 2016-03-11: qty 2

## 2016-03-11 NOTE — Progress Notes (Signed)
Recreation Therapy Notes  Date: 04.07.17 Time: 9:30 am Location: Craft Room  Group Topic: Coping Skills  Goal Area(s) Addresses:  Patient will effectively use art as a Associate Professorcoping skill. Patient will be able to identify one emotion experienced during group session. Patient will identify use of art as a coping skill.  Behavioral Response: Attentive, Interactive  Intervention: Two Faces of Me  Activity: Patients were given a blank face worksheet and instructed to draw a line down the middle. On one side, patients were instructed to draw or write how they felt when they were admitted to the hospital and draw or write how they want to feel on the other side of the face.  Education: LRT educated patients on healthy coping skills.  Education Outcome: Acknowledges education/In group clarification offered  Clinical Observations/Feedback: Patient completed activity by writing how she felt when she was admitted to the hospital and writing how she wants to feel when she is d/c. Patient contributed to group discussion by stating how her faces were different and what feeling she felt in group.  Jacquelynn CreeGreene,Vidal Lampkins M, LRT/CTRS 03/11/2016 12:29 PM

## 2016-03-11 NOTE — Progress Notes (Addendum)
[facility]  03/11/2016 1:52 PM Krista HeadingGwendolyn [name]  MRN:  [id number]  Subjective:  Ms. Krista MaduroRobert reports good mood. She denies suicidal or homicidal ideation. There are no behavioral problems and she scored and collected. She would like to return to her group home as quickly as possible as she feels that she can hold it together. She is quite ashamed of her angry outbursts at the group home. Sleep and appetite are good. She goes to groups. She accepts medication and tolerates it well. Anticipate discharge on Monday.  Principal Problem: Schizoaffective disorder, depressive type (HCC) Diagnosis:   Patient Active Problem List   Diagnosis Date Noted  . Suicidal behavior [F48.9] 03/08/2016  . IUD contraception [Z97.5] 03/02/2016  . Herpes genitalis [A60.00] 12/28/2015  . Borderline personality disorder [F60.3] 09/16/2015  . Intellectual disability [F79] 09/16/2015  . Schizoaffective disorder, depressive type (HCC) [F25.1] 09/16/2015   Total Time spent with patient: 20 minutes  Past Psychiatric History: Schizoaffective disorder.  Past Medical History:  Past Medical History  Diagnosis Date  . Schizophrenia (HCC)   . Depression   . Mental retardation   . HSV infection   . Anxiety     Past Surgical History  Procedure Laterality Date  . Cesarean section     Family History:  Family History  Problem Relation Age of Onset  . Cancer Neg Hx    Family Psychiatric  History: See H&P. Social History:  History  Alcohol Use No     History  Drug Use No    Social History   Social History  . Marital Status: Significant Other    Spouse Name: N/A  . Number of Children: N/A  . Years of Education: N/A   Social History Main Topics  . Smoking status: Never Smoker   . Smokeless tobacco: Never Used  . Alcohol Use: No  . Drug Use: No  . Sexual Activity: Not Asked   Other Topics Concern  . None   Social History Narrative   Additional Social History:                          Sleep: Fair  Appetite:  Fair  Current Medications: Current Facility-Administered Medications  Medication Dose Route Frequency Provider Last Rate Last Dose  . acetaminophen (TYLENOL) tablet 650 mg  650 mg Oral Q6H PRN Audery AmelJohn T Clapacs, MD      . alum & mag hydroxide-simeth (MAALOX/MYLANTA) 200-200-20 MG/5ML suspension 30 mL  30 mL Oral Q4H PRN Audery AmelJohn T Clapacs, MD      . citalopram (CELEXA) tablet 20 mg  20 mg Oral Daily Audery AmelJohn T Clapacs, MD   20 mg at 03/11/16 0926  . clonazePAM (KLONOPIN) tablet 1 mg  1 mg Oral QHS Audery AmelJohn T Clapacs, MD   1 mg at 03/10/16 2206  . divalproex (DEPAKOTE) DR tablet 500 mg  500 mg Oral 3 times per day Audery AmelJohn T Clapacs, MD   500 mg at 03/11/16 0553  . ferrous sulfate tablet 325 mg  325 mg Oral Q breakfast Audery AmelJohn T Clapacs, MD   325 mg at 03/11/16 0926  . haloperidol (HALDOL) tablet 5 mg  5 mg Oral QHS Quaneshia Wareing B Uma Jerde, MD      . magnesium hydroxide (MILK OF MAGNESIA) suspension 30 mL  30 mL Oral Daily PRN Audery AmelJohn T Clapacs, MD      . OLANZapine (ZYPREXA) tablet 20 mg  20 mg Oral QHS Audery AmelJohn T Clapacs, MD  20 mg at 03/10/16 2204  . senna (SENOKOT) tablet 8.6 mg  1 tablet Oral Daily PRN Audery Amel, MD      . valACYclovir (VALTREX) tablet 500 mg  500 mg Oral BID Audery Amel, MD   500 mg at 03/11/16 1610    Lab Results:  Results for orders placed or performed during the hospital encounter of 03/09/16 (from the past 48 hour(s))  Valproic acid level     Status: None   Collection Time: 03/09/16  3:49 PM  Result Value Ref Range   Valproic Acid Lvl 65 50.0 - 100.0 ug/mL  Hemoglobin A1c     Status: None   Collection Time: 03/09/16  3:49 PM  Result Value Ref Range   Hgb A1c MFr Bld 5.3 4.0 - 6.0 %  Lipid panel, fasting     Status: Abnormal   Collection Time: 03/09/16  3:49 PM  Result Value Ref Range   Cholesterol 175 0 - 200 mg/dL   Triglycerides 88 <960 mg/dL   HDL 56 >45 mg/dL   Total CHOL/HDL Ratio 3.1 RATIO   VLDL 18 0 - 40 mg/dL   LDL Cholesterol 409 (H) 0  - 99 mg/dL    Comment:        Total Cholesterol/HDL:CHD Risk Coronary Heart Disease Risk Table                     Men   Women  1/2 Average Risk   3.4   3.3  Average Risk       5.0   4.4  2 X Average Risk   9.6   7.1  3 X Average Risk  23.4   11.0        Use the calculated Patient Ratio above and the CHD Risk Table to determine the patient's CHD Risk.        ATP III CLASSIFICATION (LDL):  <100     mg/dL   Optimal  811-914  mg/dL   Near or Above                    Optimal  130-159  mg/dL   Borderline  782-956  mg/dL   High  >213     mg/dL   Very High   Prolactin     Status: Abnormal   Collection Time: 03/09/16  3:49 PM  Result Value Ref Range   Prolactin 26.3 (H) 4.8 - 23.3 ng/mL    Comment: (NOTE) Performed At: [facility] [address] [place], [place] [id number] Mila Homer MD [id number]   TSH     Status: None   Collection Time: 03/09/16  3:49 PM  Result Value Ref Range   TSH 0.651 0.350 - 4.500 uIU/mL    Blood Alcohol level:  Lab Results  Component Value Date   ETH <5 03/08/2016   ETH <5 12/27/2015    Physical Findings: AIMS: Facial and Oral Movements Muscles of Facial Expression: None, normal Lips and Perioral Area: None, normal Jaw: None, normal Tongue: None, normal,Extremity Movements Upper (arms, wrists, hands, fingers): None, normal Lower (legs, knees, ankles, toes): None, normal, Trunk Movements Neck, shoulders, hips: None, normal, Overall Severity Severity of abnormal movements (highest score from questions above): None, normal Incapacitation due to abnormal movements: None, normal Patient's awareness of abnormal movements (rate only patient's report): No Awareness, Dental Status Current problems with teeth and/or dentures?: No Does patient usually wear dentures?: No  CIWA:    COWS:  Musculoskeletal: Strength & Muscle Tone: within normal limits Gait & Station: normal Patient leans: N/A  Psychiatric Specialty  Exam: Review of Systems  Psychiatric/Behavioral: The patient is nervous/anxious.   All other systems reviewed and are negative.   Blood pressure 111/69, pulse 82, temperature 99 F (37.2 C), temperature source Oral, resp. rate 18, height  (1.651 m), weight 68.947 kg (152 lb), last menstrual period 11/04/2015, SpO2 100 %.Body mass index is 25.29 kg/(m^2).  General Appearance: Casual  Eye Contact::  Good  Speech:  Clear and Coherent  Volume:  Normal  Mood:  Anxious and Depressed  Affect:  Appropriate  Thought Process:  Goal Directed  Orientation:  Full (Time, Place, and Person)  Thought Content:  WDL  Suicidal Thoughts:  No  Homicidal Thoughts:  No  Memory:  Immediate;   Fair Recent;   Fair Remote;   Fair  Judgement:  Fair  Insight:  Shallow  Psychomotor Activity:  Normal  Concentration:  Fair  Recall:  [employer] of Knowledge:Fair  Language: Fair  Akathisia:  No  Handed:  Right  AIMS (if indicated):     Assets:  Communication Skills Desire for Improvement Financial Resources/Insurance Housing Physical Health Resilience Social Support  ADL's:  Intact  Cognition: WNL  Sleep:  Number of Hours: 6.75   Treatment Plan Summary: Daily contact with patient to assess and evaluate symptoms and progress in treatment and Medication management   Krista [name] is a 35 year old female with history of schizoaffective disorder admitted after a suicidal gesture by stabbing herself with a pencil in the context of treatment noncompliance caused by delay in delivery of her regular medications.  1. Suicidal ideation. The patient is able to contract for safety in the hospital.  2. Mood. She has been maintained on a combination of Depakote and Zyprexa for mood stabilization, and Celexa and clonazepam for depression and anxiety. Depakote level was therapeutic on admission. The patient reports that she did very well on Haldol we will start 5 mg Haldol at bedtime. EKG pending.   3. Anemia.  She is on iron pills.  4. Constipation. She is on Senna.  5. Genital herpes. She is on Valtrex.  6. Social. She is incompetent adult and has a guardian.   7. Disposition. She will be discharged back to her group home and follow up with her regular psychiatrist.  No medication changes were offered today 03/12/2016  Krista Linea, MD 03/11/2016, 1:52 PM

## 2016-03-11 NOTE — [facility] Group Notes (Signed)
[facility] Group Notes:  (Nursing/MHT/Case Management/Adjunct)  Date:  03/11/2016  Time:  4:08 PM  Type of Therapy:  Group Therapy  Participation Level:  Active  Participation Quality:  Appropriate  Affect:  Appropriate  Cognitive:  Oriented  Insight:  Good  Engagement in Group:  Monopolizing  Modes of Intervention:  Discussion  Summary of Progress/Problems:  Krista [name] 03/11/2016, 4:08 PM

## 2016-03-11 NOTE — Progress Notes (Signed)
D: Patient appears bright on approach. States she's ready to leave and go back to her group home. She showed me the superficial cuts on her arms. There appears to be some lack of education when she takes her meds at the group home. She denies SI/HI/AVH.  A: Medication was given with education. Encouragement was provided.  R: Patient was compliant with medication. She has remained calm and cooperative. Safety maintained with 15 min checks.

## 2016-03-11 NOTE — Progress Notes (Signed)
[facility]  03/11/2016 1:46 PM Krista [name]  MRN:  [id number]  Subjective:  Krista [name] has no complaints today. She feels very ashamed of her angry outbursts at the group home. She says that she has not had difficulties with her behavior for a long time and is disappointed in herself. Her group home signal is that they [facility]. Nothing in her presentation suggested she needs long-term psychiatry hospitalization. In addition they were able to confirm that there was a brief interaction in her treatment when medicines were not delivered from the pharmacy on time. The patient accepts medications and tolerates them well. She again inquired about additional Haldol. There is evidence of QTC prolongation with a combination of Haldol and Zyprexa. I will order EKG. The patient is very pleasant on the unit and participated well in groups  History usually would start chest pain he just Principal Problem: Schizoaffective disorder, depressive type (HCC) Diagnosis:   Patient Active Problem List   Diagnosis Date Noted  . Suicidal behavior [F48.9] 03/08/2016  . IUD contraception [Z97.5] 03/02/2016  . Herpes genitalis [A60.00] 12/28/2015  . Borderline personality disorder [F60.3] 09/16/2015  . Intellectual disability [F79] 09/16/2015  . Schizoaffective disorder, depressive type (HCC) [F25.1] 09/16/2015   Total Time spent with patient: 20 minutes  Past Psychiatric History: Schizophrenia.  Past Medical History:  Past Medical History  Diagnosis Date  . Schizophrenia (HCC)   . Depression   . Mental retardation   . HSV infection   . Anxiety     Past Surgical History  Procedure Laterality Date  . Cesarean section     Family History:  Family History  Problem Relation Age of Onset  . Cancer Neg Hx    Family Psychiatric  History: See H&P. Social History:  History  Alcohol Use No     History  Drug Use No     Social History   Social History  . Marital Status: Significant Other    Spouse Name: N/A  . Number of Children: N/A  . Years of Education: N/A   Social History Main Topics  . Smoking status: Never Smoker   . Smokeless tobacco: Never Used  . Alcohol Use: No  . Drug Use: No  . Sexual Activity: Not Asked   Other Topics Concern  . None   Social History Narrative   Additional Social History:                         Sleep: Fair  Appetite:  Fair  Current Medications: Current Facility-Administered Medications  Medication Dose Route Frequency Provider Last Rate Last Dose  . acetaminophen (TYLENOL) tablet 650 mg  650 mg Oral Q6H PRN Audery AmelJohn T Clapacs, MD      . alum & mag hydroxide-simeth (MAALOX/MYLANTA) 200-200-20 MG/5ML suspension 30 mL  30 mL Oral Q4H PRN Audery AmelJohn T Clapacs, MD      . citalopram (CELEXA) tablet 20 mg  20 mg Oral Daily Audery AmelJohn T Clapacs, MD   20 mg at 03/11/16 0926  . clonazePAM (KLONOPIN) tablet 1 mg  1 mg Oral QHS Audery AmelJohn T Clapacs, MD   1 mg at 03/10/16 2206  . divalproex (DEPAKOTE) DR tablet 500 mg  500 mg Oral 3 times per day Audery AmelJohn T Clapacs, MD   500 mg at 03/11/16 0553  . ferrous sulfate tablet 325 mg  325 mg Oral Q breakfast John T  Clapacs, MD   325 mg at 03/11/16 0926  . haloperidol (HALDOL) tablet 5 mg  5 mg Oral QHS Pierre Cumpton B Ayeisha Lindenberger, MD      . magnesium hydroxide (MILK OF MAGNESIA) suspension 30 mL  30 mL Oral Daily PRN Audery Amel, MD      . OLANZapine (ZYPREXA) tablet 20 mg  20 mg Oral QHS Audery Amel, MD   20 mg at 03/10/16 2204  . senna (SENOKOT) tablet 8.6 mg  1 tablet Oral Daily PRN Audery Amel, MD      . valACYclovir (VALTREX) tablet 500 mg  500 mg Oral BID Audery Amel, MD   500 mg at 03/11/16 1610    Lab Results:  Results for orders placed or performed during the hospital encounter of 03/09/16 (from the past 48 hour(s))  Valproic acid level     Status: None   Collection Time: 03/09/16  3:49 PM  Result Value Ref Range    Valproic Acid Lvl 65 50.0 - 100.0 ug/mL  Hemoglobin A1c     Status: None   Collection Time: 03/09/16  3:49 PM  Result Value Ref Range   Hgb A1c MFr Bld 5.3 4.0 - 6.0 %  Lipid panel, fasting     Status: Abnormal   Collection Time: 03/09/16  3:49 PM  Result Value Ref Range   Cholesterol 175 0 - 200 mg/dL   Triglycerides 88 <960 mg/dL   HDL 56 >45 mg/dL   Total CHOL/HDL Ratio 3.1 RATIO   VLDL 18 0 - 40 mg/dL   LDL Cholesterol 409 (H) 0 - 99 mg/dL    Comment:        Total Cholesterol/HDL:CHD Risk Coronary Heart Disease Risk Table                     Men   Women  1/2 Average Risk   3.4   3.3  Average Risk       5.0   4.4  2 X Average Risk   9.6   7.1  3 X Average Risk  23.4   11.0        Use the calculated Patient Ratio above and the CHD Risk Table to determine the patient's CHD Risk.        ATP III CLASSIFICATION (LDL):  <100     mg/dL   Optimal  811-914  mg/dL   Near or Above                    Optimal  130-159  mg/dL   Borderline  782-956  mg/dL   High  >213     mg/dL   Very High   Prolactin     Status: Abnormal   Collection Time: 03/09/16  3:49 PM  Result Value Ref Range   Prolactin 26.3 (H) 4.8 - 23.3 ng/mL    Comment: (NOTE) Performed At: [facility] [address], [place] [id number] Mila Homer MD [id number]   TSH     Status: None   Collection Time: 03/09/16  3:49 PM  Result Value Ref Range   TSH 0.651 0.350 - 4.500 uIU/mL    Blood Alcohol level:  Lab Results  Component Value Date   ETH <5 03/08/2016   ETH <5 12/27/2015    Physical Findings: AIMS: Facial and Oral Movements Muscles of Facial Expression: None, normal Lips and Perioral Area: None, normal Jaw: None, normal Tongue: None, normal,Extremity Movements Upper (arms,  wrists, hands, fingers): None, normal Lower (legs, knees, ankles, toes): None, normal, Trunk Movements Neck, shoulders, hips: None, normal, Overall Severity Severity of abnormal movements (highest score  from questions above): None, normal Incapacitation due to abnormal movements: None, normal Patient's awareness of abnormal movements (rate only patient's report): No Awareness, Dental Status Current problems with teeth and/or dentures?: No Does patient usually wear dentures?: No  CIWA:    COWS:     Musculoskeletal: Strength & Muscle Tone: within normal limits Gait & Station: normal Patient leans: N/A  Psychiatric Specialty Exam: Review of Systems  Psychiatric/Behavioral: The patient is nervous/anxious.   All other systems reviewed and are negative.   Blood pressure 111/69, pulse 82, temperature 99 F (37.2 C), temperature source Oral, resp. rate 18, height  (1.651 m), weight 68.947 kg (152 lb), last menstrual period 11/04/2015, SpO2 100 %.Body mass index is 25.29 kg/(m^2).  General Appearance: Casual  Eye Contact::  Good  Speech:  Clear and Coherent  Volume:  Normal  Mood:  Euthymic  Affect:  Appropriate  Thought Process:  Goal Directed  Orientation:  Full (Time, Place, and Person)  Thought Content:  Delusions and Paranoid Ideation  Suicidal Thoughts:  No  Homicidal Thoughts:  No  Memory:  Immediate;   Fair Recent;   Fair Remote;   Fair  Judgement:  Impaired  Insight:  Shallow  Psychomotor Activity:  Normal  Concentration:  Fair  Recall:  [employer] of Knowledge:Fair  Language: Fair  Akathisia:  No  Handed:  Right  AIMS (if indicated):     Assets:  Communication Skills Desire for Improvement Financial Resources/Insurance Housing Physical Health Resilience Social Support  ADL's:  Intact  Cognition: WNL  Sleep:  Number of Hours: 6.75   Treatment Plan Summary: Daily contact with patient to assess and evaluate symptoms and progress in treatment and Medication management   Ms. Heine is a 35 year old female with history of schizoaffective disorder admitted after a suicidal gesture by stabbing herself with a pencil in the context of treatment noncompliance  caused by delay in delivery of her regular medications.  1. Suicidal ideation. The patient is able to contract for safety in the hospital.  2. Mood. She has been maintained on a combination of Depakote and Zyprexa for mood stabilization, and Celexa and clonazepam for depression and anxiety. Depakote level was therapeutic on admission. The patient reports that she did very well on Haldol we will start 5 mg Haldol at bedtime. EKG pending.   3. Anemia. She is on iron pills.  4. Constipation. She is on Senna.  5. Genital herpes. She is on Valtrex.  6. Social. She is incompetent adult and has a guardian.   7. Disposition. She will be discharged back to her group home and follow up with her regular psychiatrist.  [name], MD 03/11/2016, 1:46 PM

## 2016-03-11 NOTE — Progress Notes (Signed)
Verbalizes that she feels much better and is ready to go home.  Denies SI and states that depression is much better.  Medication and group compliant.  Maintains personal care chores, good appetite.  Safety maintained.

## 2016-03-11 NOTE — [facility] Group Notes (Signed)
[facility] LCSW Group Therapy  03/11/2016 3:13 PM  Type of Therapy:  Group Therapy  Participation Level:  Did Not Attend  Modes of Intervention:  Discussion, Education, Socialization and Support  Summary of Progress/Problems: Feelings around Relapse. Group members discussed the meaning of relapse and shared personal stories of relapse, how it affected them and others, and how they perceived themselves during this time. Group members were encouraged to identify triggers, warning signs and coping skills used when facing the possibility of relapse. Social supports were discussed and explored in detail.   Ayden Apodaca L Eliannah Hinde MSW, LCSWA  03/11/2016, 3:13 PM   

## 2016-03-11 NOTE — Plan of Care (Signed)
Problem: Diagnosis: Increased Risk For Suicide Attempt Goal: STG-Patient Will Comply With Medication Regime Outcome: Progressing Patient was compliant with medication this evening.

## 2016-03-12 NOTE — Progress Notes (Signed)
Pt  Has been pleasant and cooperative. Pt has been seclusive to his room. No inappropriate behaviors noted. Pt denies SI and A/v hallucinations. Pt's mood and affect  has been depressed. 

## 2016-03-12 NOTE — Progress Notes (Signed)
D: Pt pleasant and appropriate during interaction with staff and peers. Visible in milieu and attended evening group. Endorses AH regarding her mother but stated "I am ignoring them because I know they aren't real". Expresses anxiety over placement when discharged. Afraid that she is going to be homeless "because the group home will not let me go back". Denies SI and pain. A: Encouragement and support provided. Medications given as prescribed. Q15 minute checks maintained for safety. R: Remains safe on unit. Med. Compliant. Voices no additional concerns. Will continue to monitor.

## 2016-03-12 NOTE — [facility] Group Notes (Signed)
[facility] LCSW Group Therapy  03/12/2016 1:24 PM  Type of Therapy:  Group Therapy  Participation Level:  Active  Participation Quality:  Attentive  Affect:  Flat  Cognitive:  Alert  Insight:  Improving  Engagement in Therapy:  Improving  Modes of Intervention:  Discussion, Education, Socialization and Support  Summary of Progress/Problems: Self esteem: Patients discussed self esteem and how it impacts them. They discussed what aspects in their lives has influenced their self esteem. They were challenged to identify changes that are needed in order to improve self esteem.  Elinor DodgeGwendolyn states she started having symptoms at a young age. She reports she started cutting due to symptoms and eventually went to group homes. She reports the treatment at group homes have greatly impacted her self esteem negatively.   Zayyan Mullen L Payden Docter MSW, LCSWA  03/12/2016, 1:24 PM

## 2016-03-12 NOTE — [facility] Group Notes (Signed)
[facility] Group Notes:  (Nursing/MHT/Case Management/Adjunct)  Date:  03/12/2016  Time:  10:45 AM  Type of Therapy:  goal setting   Participation Level:  Active  Participation Quality:  Appropriate, Attentive and Supportive  Affect:  Appropriate  Cognitive:  Appropriate  Insight:  Appropriate  Engagement in Group:  Supportive  Modes of Intervention:  goal setting   Summary of Progress/Problems:  Twanna Hymanda C Shields Pautz 03/12/2016, 10:45 AM

## 2016-03-13 NOTE — Progress Notes (Signed)
D: Pt denies SI/HI. Endorses AH but states " I heard them last night, not today". Pleasant during interaction. Anxious over placement after discharge and fears being homeless. Denies pain. Appropriate with peers and staff. Visible in milieu. Voices no additional concerns.  A: Encouragement and support provided. Q15 minute checks maintained for safety. Medications given as prescribed. R: Remains safe on unit. Medication compliant. Receptive to interventions. Will continue to monitor.

## 2016-03-13 NOTE — [facility] Group Notes (Signed)
[facility] LCSW Group Therapy  03/13/2016 4:27 PM  Type of Therapy:  Group Therapy  Participation Level:  Active  Participation Quality:  Attentive  Affect:  Appropriate  Cognitive:  Alert  Insight:  Improving  Engagement in Therapy:  Improving  Modes of Intervention:  Discussion, Education, Socialization and Support  Summary of Progress/Problems: Todays topic: Grudges  Patients will be encouraged to discuss their thoughts, feelings, and behaviors as to why one holds on to grudges and reasons why people have grudges. Patients will process the impact of grudges on their daily lives and identify thoughts and feelings related to holding grudges. Patients will identify feelings and thoughts related to what life would look like without grudges. Pt discussed past events that have lead to holding grudges. She states these events have caused her to not trust people.   Krista [name]  MSW, LCSWA   03/13/2016, 4:27 PM

## 2016-03-13 NOTE — Progress Notes (Signed)
Pt Has been pleasant and cooperative. Pt has been less seclusive to her room. No inappropriate behaviors noted. Pt denies SI and A/v hallucinations. Pt's mood and affect has been depressed. Pt has been active on the unit.

## 2016-03-13 NOTE — Progress Notes (Signed)
[name] MD Progress Note  03/13/2016 6:40 AM Krista [name]  MRN:  [id number]  Subjective:  Krista [name] no longer reports auditory hallucinations after we started Haldol. She scored and collected, there are no behavioral problems. She accepts medications and tolerates them well. She stays mostly to her home but tries to participate in programming. There are no somatic complaints. Anticipated discharge tomorrow.  Principal Problem: Schizoaffective disorder, depressive type (HCC) Diagnosis:   Patient Active Problem List   Diagnosis Date Noted  . Suicidal behavior [F48.9] 03/08/2016  . IUD contraception [Z97.5] 03/02/2016  . Herpes genitalis [A60.00] 12/28/2015  . Borderline personality disorder [F60.3] 09/16/2015  . Intellectual disability [F79] 09/16/2015  . Schizoaffective disorder, depressive type (HCC) [F25.1] 09/16/2015   Total Time spent with patient: 20 minutes  Past Psychiatric History: Schizoaffective disorder.  Past Medical History:  Past Medical History  Diagnosis Date  . Schizophrenia (HCC)   . Depression   . Mental retardation   . HSV infection   . Anxiety     Past Surgical History  Procedure Laterality Date  . Cesarean section     Family History:  Family History  Problem Relation Age of Onset  . Cancer Neg Hx    Family Psychiatric  History: See H&P. Social History:  History  Alcohol Use No     History  Drug Use No    Social History   Social History  . Marital Status: Significant Other    Spouse Name: N/A  . Number of Children: N/A  . Years of Education: N/A   Social History Main Topics  . Smoking status: Never Smoker   . Smokeless tobacco: Never Used  . Alcohol Use: No  . Drug Use: No  . Sexual Activity: Not Asked   Other Topics Concern  . None   Social History Narrative   Additional Social History:                         Sleep: Fair  Appetite:  Fair  Current Medications: Current Facility-Administered Medications   Medication Dose Route Frequency Provider Last Rate Last Dose  . acetaminophen (TYLENOL) tablet 650 mg  650 mg Oral Q6H PRN Audery Amel, MD      . alum & mag hydroxide-simeth (MAALOX/MYLANTA) 200-200-20 MG/5ML suspension 30 mL  30 mL Oral Q4H PRN Audery Amel, MD      . citalopram (CELEXA) tablet 20 mg  20 mg Oral Daily Audery Amel, MD   20 mg at 03/12/16 0924  . clonazePAM (KLONOPIN) tablet 1 mg  1 mg Oral QHS Audery Amel, MD   1 mg at 03/12/16 2139  . divalproex (DEPAKOTE) DR tablet 500 mg  500 mg Oral 3 times per day Audery Amel, MD   500 mg at 03/12/16 2139  . ferrous sulfate tablet 325 mg  325 mg Oral Q breakfast Audery Amel, MD   325 mg at 03/12/16 0924  . haloperidol (HALDOL) tablet 5 mg  5 mg Oral QHS Shari Prows, MD   5 mg at 03/12/16 2139  . magnesium hydroxide (MILK OF MAGNESIA) suspension 30 mL  30 mL Oral Daily PRN Audery Amel, MD      . OLANZapine (ZYPREXA) tablet 20 mg  20 mg Oral QHS Audery Amel, MD   20 mg at 03/12/16 2139  . senna (SENOKOT) tablet 8.6 mg  1 tablet Oral Daily PRN Audery Amel, MD      .  valACYclovir (VALTREX) tablet 500 mg  500 mg Oral BID Audery AmelJohn T Clapacs, MD   500 mg at 03/12/16 2139    Lab Results: No results found for this or any previous visit (from the past 48 hour(s)).  Blood Alcohol level:  Lab Results  Component Value Date   ETH <5 03/08/2016   ETH <5 12/27/2015    Physical Findings: AIMS: Facial and Oral Movements Muscles of Facial Expression: None, normal Lips and Perioral Area: None, normal Jaw: None, normal Tongue: None, normal,Extremity Movements Upper (arms, wrists, hands, fingers): None, normal Lower (legs, knees, ankles, toes): None, normal, Trunk Movements Neck, shoulders, hips: None, normal, Overall Severity Severity of abnormal movements (highest score from questions above): None, normal Incapacitation due to abnormal movements: None, normal Patient's awareness of abnormal movements (rate only  patient's report): No Awareness, Dental Status Current problems with teeth and/or dentures?: No Does patient usually wear dentures?: No  CIWA:    COWS:     Musculoskeletal: Strength & Muscle Tone: within normal limits Gait & Station: normal Patient leans: N/A  Psychiatric Specialty Exam: Review of Systems  Psychiatric/Behavioral: Positive for hallucinations.  All other systems reviewed and are negative.   Blood pressure 110/77, pulse 64, temperature 98.1 F (36.7 C), temperature source Oral, resp. rate 18, height 5\' 5"  (1.651 m), weight 68.947 kg (152 lb), last menstrual period 11/04/2015, SpO2 100 %.Body mass index is 25.29 kg/(m^2).  General Appearance: Casual  Eye Contact::  Fair  Speech:  Garbled  Volume:  Normal  Mood:  Euthymic  Affect:  Appropriate  Thought Process:  Goal Directed  Orientation:  Full (Time, Place, and Person)  Thought Content:  WDL  Suicidal Thoughts:  No  Homicidal Thoughts:  No  Memory:  Immediate;   Fair Recent;   Fair Remote;   Fair  Judgement:  Poor  Insight:  Shallow  Psychomotor Activity:  Normal  Concentration:  Fair  Recall:  [employer]Fair  Fund of Knowledge:Fair  Language: Fair  Akathisia:  No  Handed:  Right  AIMS (if indicated):     Assets:  Communication Skills Desire for Improvement Financial Resources/Insurance Housing Physical Health Resilience Social Support  ADL's:  Intact  Cognition: WNL  Sleep:  Number of Hours: 7   Treatment Plan Summary: Daily contact with patient to assess and evaluate symptoms and progress in treatment and Medication management   Krista [name] is a 35 year old female with history of schizoaffective disorder admitted after a suicidal gesture by stabbing herself with a pencil in the context of treatment noncompliance caused by delay in delivery of her regular medications.  1. Suicidal ideation. The patient is able to contract for safety in the hospital.  2. Mood. She has been maintained on a combination of  Depakote and Zyprexa for mood stabilization, and Celexa and clonazepam for depression and anxiety. Depakote level was therapeutic on admission. The patient reports that she did very well on Haldol we will start 5 mg Haldol at bedtime. EKG pending.   3. Anemia. She is on iron pills.  4. Constipation. She is on Senna.  5. Genital herpes. She is on Valtrex.  6. Social. She is incompetent adult and has a guardian.   7. Disposition. She will be discharged back to her group home and follow up with her regular psychiatrist.  No medication changes were offered today 03/13/2016  Kristine LineaJolanta Jeanette Rauth, MD 03/13/2016, 6:40 AM

## 2016-03-13 NOTE — [facility] Group Notes (Signed)
[facility] Group Notes:  (Nursing/MHT/Case Management/Adjunct)  Date:  03/13/2016  Time:  9:20 AM  Type of Therapy:  goal setting   Participation Level:  Minimal  Participation Quality:  Appropriate  Affect:  Flat  Cognitive:  Appropriate  Insight:  Appropriate  Engagement in Group:  Limited  Modes of Intervention:  goal setting   Summary of Progress/Problems:  Krista [name] Krista [name] 03/13/2016, 9:20 AM

## 2016-03-14 DIAGNOSIS — F251 Schizoaffective disorder, depressive type: Principal | ICD-10-CM

## 2016-03-14 MED ORDER — HALOPERIDOL 5 MG PO TABS: 5.0000 mg | ORAL_TABLET | Freq: Every day | ORAL | Status: DC

## 2016-03-14 MED ORDER — CITALOPRAM HYDROBROMIDE 20 MG PO TABS: 20.0000 mg | ORAL_TABLET | Freq: Every day | ORAL | Status: DC

## 2016-03-14 NOTE — Discharge Summary (Signed)
Physician Discharge Summary Note  Patient:  Krista [name] is an 35 y.o., female MRN:  [id number] DOB:  1981-09-03 Patient phone:  [phone] (home)  Patient address:   [address] [place] [address],  Total Time spent with patient: 30 minutes  Date of Admission:  03/09/2016 Date of Discharge: 03/14/2016  Reason for Admission:  Agitation.  Identifying data. Krista [name] is a 35 year old female with history of schizoaffective disorder.  Chief complaint. "I always get depressed when I stopped my medications."  History of present illness. Information was obtained from the patient and the chart. The patient reports that she became increasingly depressed with poor sleep and decreased appetite, feeling of guilt and hopelessness worthlessness, poor energy and concentration, social isolation, crying spells and suicidal thinking over past few days en she was off her prescribed medication. In spite of the fact that the patient lives in group home she had no access to medication because the pharmacy did not send them on time. The patient points out that every time she is off medications she becomes depressed and suicidal. This time she started stabbing herself with a coloring pencil and scratching her forearm. In spite of her complaints, Depakote level was therapeutic indicating that she has been taking medication. The patient denies any symptoms of anxiety, psychosis, or symptoms suggestive of bipolar mania. She points out that in the past she did much better on Haldol and is asking if he could be restarted. She does not know why it was discontinued in the first place. Our records indicate that she has not been on Haldol at least since 2015. There is no alcohol or substances involved.  PAST PSYCHIATRIC HISTORY: She has had multiple admissions in the past in [place] and in [place] area with longstanding diagnosis of schizoaffective disorder. She has a history of suicide attempts,  self-mutilation, and some agitation. She has been tried on multiple medications. She does not remember names other than Haldol that she liked the most.  FAMILY PSYCHIATRIC HISTORY: None reported.   SOCIAL HISTORY: she graduated in high school in special education classes. She has a daughter who lives in [place] with her patient's parents. She is disabled from mental illness. She has a guardian trough SS in [place] [phone]. She was charged in the past with destruction of property. There are no current legal charges pending.   Principal Problem: Schizoaffective disorder, depressive type [facility]) Discharge Diagnoses: Patient Active Problem List   Diagnosis Date Noted  . Suicidal behavior [F48.9] 03/08/2016  . IUD contraception [Z97.5] 03/02/2016  . Herpes genitalis [A60.00] 12/28/2015  . Borderline personality disorder [F60.3] 09/16/2015  . Intellectual disability [F79] 09/16/2015  . Schizoaffective disorder, depressive type (HCC) [F25.1] 09/16/2015    Past Psychiatric History: Schizoaffective disorder.  Past Medical History:  Past Medical History  Diagnosis Date  . Schizophrenia (HCC)   . Depression   . Mental retardation   . HSV infection   . Anxiety     Past Surgical History  Procedure Laterality Date  . Cesarean section     Family History:  Family History  Problem Relation Age of Onset  . Cancer Neg Hx    Family Psychiatric  History: None reported. Social History:  History  Alcohol Use No     History  Drug Use No    Social History   Social History  . Marital Status: Significant Other    Spouse Name: N/A  . Number of Children: N/A  . Years of Education: N/A  Social History Main Topics  . Smoking status: Never Smoker   . Smokeless tobacco: Never Used  . Alcohol Use: No  . Drug Use: No  . Sexual Activity: Not Asked   Other Topics Concern  . None   Social History Narrative    Hospital Course:    Krista [name] is a 35 year old female with  history of schizoaffective disorder admitted after a suicidal gesture by stabbing herself with a pencil in the context of treatment noncompliance caused by delay in delivery of her regular medications.  1. Suicidal ideation. This has resolved. The patient is able to contract for safety. She is forward thinking and optimistic about the future.  2. Mood and psychosis. She has been maintained on a combination of Depakote and Zyprexa for mood stabilization, and Celexa and clonazepam for depression and anxiety. Depakote level was therapeutic on admission. The patient reports that she did very well on Haldol. We added 5 mg Haldol at bedtime for hallucinations. EKG was unremarkable. QTC 380.    3. Anemia. She is on iron pills.  4. Constipation. She is on Senna.  5. Genital herpes. She is on Valtrex.  6. Social. She is incompetent adult and has a guardian.   7. Metabolic syndrome monitoring. Lipid profile, HgbA1C, and TSH were normal. PRL 26.    8. Disposition. She was discharged back to her group home and follow up with her regular psychiatrist.  Physical Findings: AIMS: Facial and Oral Movements Muscles of Facial Expression: None, normal Lips and Perioral Area: None, normal Jaw: None, normal Tongue: None, normal,Extremity Movements Upper (arms, wrists, hands, fingers): None, normal Lower (legs, knees, ankles, toes): None, normal, Trunk Movements Neck, shoulders, hips: None, normal, Overall Severity Severity of abnormal movements (highest score from questions above): None, normal Incapacitation due to abnormal movements: None, normal Patient's awareness of abnormal movements (rate only patient's report): No Awareness, Dental Status Current problems with teeth and/or dentures?: No Does patient usually wear dentures?: No  CIWA:    COWS:     Musculoskeletal: Strength & Muscle Tone: within normal limits Gait & Station: normal Patient leans: N/A  Psychiatric Specialty Exam: Review of  Systems  All other systems reviewed and are negative.   Blood pressure 120/75, pulse 67, temperature 98 F (36.7 C), temperature source Oral, resp. rate 18, height 5\' 5"  (1.651 m), weight 68.947 kg (152 lb), last menstrual period 11/04/2015, SpO2 100 %.Body mass index is 25.29 kg/(m^2).  See SRA.                                                  Sleep:  Number of Hours: 7.45   Have you used any form of tobacco in the last 30 days? (Cigarettes, Smokeless Tobacco, Cigars, and/or Pipes): No  Has this patient used any form of tobacco in the last 30 days? (Cigarettes, Smokeless Tobacco, Cigars, and/or Pipes) Yes, No  Blood Alcohol level:  Lab Results  Component Value Date   [facility]   ETH <5 12/27/2015    Metabolic Disorder Labs:  Lab Results  Component Value Date   HGBA1C 5.3 03/09/2016   Lab Results  Component Value Date   PROLACTIN 26.3* 03/09/2016   Lab Results  Component Value Date   CHOL 175 03/09/2016   TRIG 88 03/09/2016   HDL 56 03/09/2016   CHOLHDL 3.1 03/09/2016  VLDL 18 03/09/2016   LDLCALC 101* 03/09/2016   LDLCALC 103* 08/08/2014    See Psychiatric Specialty Exam and Suicide Risk Assessment completed by Attending Physician prior to discharge.  Discharge destination:  Home  Is patient on multiple antipsychotic therapies at discharge:  Yes,   Do you recommend tapering to monotherapy for antipsychotics?  No   Has Patient had three or more failed trials of antipsychotic monotherapy by history:  Yes,   Antipsychotic medications that previously failed include:   1.  Zyprexa., 2.  Haldol. and 3.  Seroquel.  Recommended Plan for Multiple Antipsychotic Therapies: Additional reason(s) for multiple antispychotic treatment:  Poor response to a single agent.  Discharge Instructions    Diet - low sodium heart healthy    Complete by:  As directed      Increase activity slowly    Complete by:  As directed             Medication List     TAKE these medications      Indication   citalopram 20 MG tablet  Commonly known as:  CELEXA  Take 1 tablet (20 mg total) by mouth daily.   Indication:  Depression     clonazePAM 1 MG tablet  Commonly known as:  KLONOPIN  Take 1 mg by mouth at bedtime. Pt also takes three times daily as needed for agitation.      divalproex 500 MG DR tablet  Commonly known as:  DEPAKOTE  Take 500 mg by mouth 2 (two) times daily.      ferrous sulfate 325 (65 FE) MG tablet  Take 325 mg by mouth daily with breakfast.      haloperidol 5 MG tablet  Commonly known as:  HALDOL  Take 1 tablet (5 mg total) by mouth at bedtime.   Indication:  Psychosis     OLANZapine 20 MG tablet  Commonly known as:  ZYPREXA  Take 1 tablet (20 mg total) by mouth at bedtime.      senna 8.6 MG tablet  Commonly known as:  SENOKOT  Take 1 tablet by mouth at bedtime.      valACYclovir 500 MG tablet  Commonly known as:  VALTREX  Take 1 tablet (500 mg total) by mouth 2 (two) times daily.            Follow-up Information    Follow up with [facility].   Contact information:   [address] [place] [address] Ph [phone] Fax [phone]      Follow up with [facility].   Contact information:   [address] [place], [place] [phone] Fax [phone]      Follow-up recommendations:  Activity:  As tolerated. Diet:  Low sodium heart healthy. Other:  Keep follow-up appointments.  Comments:    Signed: [name], MD 03/14/2016, 11:13 AM Fayrene Fearing he is his brother is once as

## 2016-03-14 NOTE — [facility] Suicide Risk Assessment (Signed)
[facility]   Principal Problem: Schizoaffective disorder, depressive type [facility](HCC) Discharge Diagnoses:  Patient Active Problem List   Diagnosis Date Noted  . Suicidal behavior [F48.9] 03/08/2016  . IUD contraception [Z97.5] 03/02/2016  . Herpes genitalis [A60.00] 12/28/2015  . Borderline personality disorder [F60.3] 09/16/2015  . Intellectual disability [F79] 09/16/2015  . Schizoaffective disorder, depressive type (HCC) [F25.1] 09/16/2015    Total Time spent with patient: 30 minutes  Musculoskeletal: Strength & Muscle Tone: within normal limits Gait & Station: normal Patient leans: N/A  Psychiatric Specialty Exam: Review of Systems  All other systems reviewed and are negative.   Blood pressure 120/75, pulse 67, temperature 98 F (36.7 C), temperature source Oral, resp. rate 18, height 5\' 5"  (1.651 m), weight 68.947 kg (152 lb), last menstrual period 11/04/2015, SpO2 100 %.Body mass index is 25.29 kg/(m^2).  General Appearance: Casual  Eye Contact::  Fair  Speech:  Clear and Coherent409  Volume:  Normal  Mood:  Anxious  Affect:  Appropriate  Thought Process:  Goal Directed  Orientation:  Full (Time, Place, and Person)  Thought Content:  WDL  Suicidal Thoughts:  No  Homicidal Thoughts:  No  Memory:  Immediate;   Fair Recent;   Fair Remote;   Fair  Judgement:  Impaired  Insight:  Shallow  Psychomotor Activity:  Normal  Concentration:  Fair  Recall:  [employer]Fair  Fund of Knowledge:Fair  Language: Fair  Akathisia:  No  Handed:  Right  AIMS (if indicated):     Assets:  Communication Skills Desire for Improvement Financial Resources/Insurance Housing Leisure Time Resilience Social Support  Sleep:  Number of Hours: 7.45  Cognition: WNL  ADL's:  Intact   Mental Status Per Nursing Assessment::   On Admission:  Self-harm behaviors  Demographic Factors:  Caucasian  Loss Factors: NA  Historical Factors: Impulsivity  Risk Reduction  Factors:   Sense of responsibility to family, Living with another person, especially a relative, Positive social support and Positive therapeutic relationship  Continued Clinical Symptoms:  Schizophrenia:   Depressive state Less than 465 years old  Cognitive Features That Contribute To Risk:  None    Suicide Risk:  Minimal: No identifiable suicidal ideation.  Patients presenting with no risk factors but with morbid ruminations; may be classified as minimal risk based on the severity of the depressive symptoms  Follow-up Information    Follow up with [facility].   Contact information:   [address] [place] [address] Ph [phone] Fax [phone]      [facility]   Contact information:   [address] [place], [place] [phone] Fax [phone]      Plan Of Care/Follow-up recommendations:  Activity:  As tolerated. Diet:  Low sodium heart healthy. Other:  Keep follow-up appointments.  Kristine LineaJolanta Demonie Kassa, MD 03/14/2016, 11:10 AM

## 2016-03-14 NOTE — [facility] Group Notes (Signed)
[facility] Group Notes:  (Nursing/MHT/Case Management/Adjunct)  Date:  03/14/2016  Time:  4:13 AM  Type of Therapy:  Group Therapy  Participation Level:  Active  Participation Quality:  Appropriate  Affect:  Appropriate  Cognitive:  Alert  Insight:  Improving  Engagement in Group:  Developing/Improving  Modes of Intervention:  Discussion  Summary of Progress/Problems: Pt was a lot more talkative than she had been in previous groups. Stated that she understands the mistakes she made and hopes her group home will let her come back. PT did admit to currently hearing voices, but was using her coping skills to get her through the day.   Krista [name] Krista [name] 03/14/2016, 4:13 AM

## 2016-03-14 NOTE — Progress Notes (Signed)
Recreation Therapy Notes  Date: 04.10.17 Time: 9:45 am Location: Craft Room  Group Topic: Coping Skills  Goal Area(s) Addresses:  Patient will participate in healthy coping skill. Patient will verbalize one positive emotion experienced during group.  Behavioral Response: Attentive, Left early  Intervention: Coloring  Activity: Patients were given coloring sheets to color and were encouraged to think of what emotions they were experiencing while coloring and what they were thinking about.  Education: LRT educated patients on healthy coping skills.  Education Outcome: Patient left before LRT educated group.   Clinical Observations/Feedback: Patient completed activity by coloring the coloring sheet. Patient left group at approximately 10:03 am. Patient did not return to group.  Jacquelynn CreeGreene,Chelan Heringer M, LRT/CTRS 03/14/2016 10:42 AM

## 2016-03-14 NOTE — [facility] Group Notes (Signed)
[facility] LCSW Group Therapy  03/14/2016 4:04 PM  Type of Therapy:  Group Therapy  Participation Level:  Active  Participation Quality:  Sharing and Supportive  Affect:  Appropriate  Cognitive:  Appropriate  Insight:  Improving  Engagement in Therapy:  Engaged  Modes of Intervention:  Activity, Discussion, Problem-solving, Socialization and Support  Summary of Progress/Problems:Utilized Wheel of Fortune activity to introduce topic of the day.  Pt attended and participated in group discussion around overcoming obstacles, Pt able to share some obstacles they are facing and as a group examined the way our thoughts contribute to the way obstacles are viewed.  Practiced identifying unhelpful thoughts.  Glennon MacLaws, Bruce Churilla P, MSW, LCSW 03/14/2016, 4:04 PM

## 2016-03-14 NOTE — [place] FL2 (Signed)
[place] MEDICAID FL2 LEVEL OF CARE SCREENING TOOL     IDENTIFICATION  Patient Name: Krista [name] Birthdate: August 25, 1981 Sex: female Admission Date (Current Location): 03/09/2016  [place] and [place] Number:  [name] [id number] [facility] Facility and Address:  [facility], [address] [place], [place] [address]      Provider Number: [id number]  Attending Physician Name and Address:  Krista Prows, MD  Relative Name and Phone Number:  Krista [name] DSS Guardian [phone]    Current Level of Care: Hospital Recommended Level of Care: Other (Comment) (group home) Prior Approval Number:    Date Approved/Denied:   PASRR Number:    Discharge Plan: Other (Comment) ([facility] [address] [place] [phone])    Current Diagnoses: Patient Active Problem List   Diagnosis Date Noted  . Suicidal behavior 03/08/2016  . IUD contraception 03/02/2016  . Herpes genitalis 12/28/2015  . Borderline personality disorder 09/16/2015  . Intellectual disability 09/16/2015  . Schizoaffective disorder, depressive type (HCC) 09/16/2015    Orientation RESPIRATION BLADDER Height & Weight     Self, Time, Situation, Place  Normal Continent Weight: 152 lb (68.947 kg) Height:   (165.1 cm)  BEHAVIORAL SYMPTOMS/MOOD NEUROLOGICAL BOWEL NUTRITION STATUS      Continent  (normal)  AMBULATORY STATUS COMMUNICATION OF NEEDS Skin   Independent Verbally Normal                       Personal Care Assistance Level of Assistance  Bathing, Feeding, Dressing Bathing Assistance: Independent Feeding assistance: Independent Dressing Assistance: Independent     Functional Limitations Info  Sight, Hearing, Speech Sight Info: Adequate Hearing Info: Adequate Speech Info: Adequate    SPECIAL CARE FACTORS FREQUENCY                       Contractures      Additional Factors Info  Psychotropic                Current Medications (03/14/2016):  This is the current hospital active medication list Current Facility-Administered Medications  Medication Dose Route Frequency Provider Last Rate Last Dose  . acetaminophen (TYLENOL) tablet 650 mg  650 mg Oral Q6H PRN Krista Amel, MD      . alum & mag hydroxide-simeth (MAALOX/MYLANTA) 200-200-20 MG/5ML suspension 30 mL  30 mL Oral Q4H PRN Krista Amel, MD      . citalopram (CELEXA) tablet 20 mg  20 mg Oral Daily Krista Amel, MD   20 mg at 03/14/16 0839  . clonazePAM (KLONOPIN) tablet 1 mg  1 mg Oral QHS Krista Amel, MD   1 mg at 03/13/16 2204  . divalproex (DEPAKOTE) DR tablet 500 mg  500 mg Oral 3 times per day Krista Amel, MD   500 mg at 03/14/16 0640  . ferrous sulfate tablet 325 mg  325 mg Oral Q breakfast Krista Amel, MD   325 mg at 03/14/16 0839  . haloperidol (HALDOL) tablet 5 mg  5 mg Oral QHS Krista Prows, MD   5 mg at 03/13/16 2204  . magnesium hydroxide (MILK OF MAGNESIA) suspension 30 mL  30 mL Oral Daily PRN Krista Amel, MD      . OLANZapine (ZYPREXA) tablet 20 mg  20 mg Oral QHS Krista Amel, MD   20 mg at 03/13/16 2204  . senna (SENOKOT) tablet 8.6 mg  1 tablet Oral  Daily PRN Krista AmelJohn [name] Clapacs, MD      . valACYclovir (VALTREX) tablet 500 mg  500 mg Oral BID Krista AmelJohn [name] Clapacs, MD   500 mg at 03/14/16 [id number]     Discharge Medications: Please see discharge summary for a list of discharge medications.  Relevant Imaging Results:  Relevant Lab Results:   Additional Information    Krista [name], Krista Lariviere T, LCSW 03/14/2016 [phone]

## 2016-03-14 NOTE — Progress Notes (Signed)
Pt discharged home. DC instructions provided and explained, Medications reviewed. Rx given. All questions answered. Pt stable at discharge. Denies SI, HI, AVH. Belongings returned

## 2016-03-14 NOTE — Progress Notes (Signed)
D: Pt denies SI/HI/AVH.  Denies pain. Pt stated " I didn't hear any voices last night and I hope I don't tonight." Pleasant during interaction but becomes anxious when she starts to think about placement after discharge. Affect depressed, attended evening group. Compliant with medications. A:Encouragement and support provided. Q15 minute checks maintained for safety. Medications given as prescribed.  R: Pt remains safe on unit. Receptive to interventions. Will continue to monitor.
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
## 2016-03-15 NOTE — Progress Notes (Signed)
  [facility] Discharge Plan :  Will you be returning to the same living situation after discharge:  Yes,  back to group home At discharge, do you have transportation home?: Yes,  group home will pick up Do you have the ability to pay for your medications: Yes,  patient has Medicaid  Release of information consent forms completed and in the chart;  Patient's signature needed at discharge.  Patient to Follow up at: Follow-up Information    Follow up with [facility]. Go on 03/14/2016.   Why:  Patient will return to group home today 03/14/16   Contact information:   [address] [place] [address] Ph [phone]  Fax [phone]      Follow up with [facility]. Go on 03/16/2016.   Why:  For follow-up care appt Wednesday 03/16/16 at 9:00am (walk in appts M-F 9/3)   Contact information:   [address] [place], [place] Ph [phone] Fax [phone]      Next level of care provider has access to [facility] Link:no  Safety Planning and Suicide Prevention discussed: Yes,  SPE discussed with patient and Clinton SawyerByron White (group home director) [phone]   Have you used any form of tobacco in the last 30 days? (Cigarettes, Smokeless Tobacco, Cigars, and/or Pipes): No  Has patient been referred to the Quitline?: N/A patient is not a smoker  Patient has been referred for addiction treatment: N/A  Lulu RidingIngle, Shandreka Dante T, MSW, LCSW 03/15/2016, 9:20 AM [phone]

## 2016-04-17 ENCOUNTER — Encounter: Payer: Self-pay | Admitting: Emergency Medicine

## 2016-04-17 ENCOUNTER — Emergency Department
Admission: EM | Admit: 2016-04-17 | Discharge: 2016-04-17 | Disposition: A | Discharge status: Home / Self Care | Payer: Medicaid Other | Attending: Emergency Medicine | Admitting: Emergency Medicine

## 2016-04-17 DIAGNOSIS — F603 Borderline personality disorder: Secondary | ICD-10-CM | POA: Diagnosis not present

## 2016-04-17 DIAGNOSIS — F4324 Adjustment disorder with disturbance of conduct: Secondary | ICD-10-CM | POA: Diagnosis not present

## 2016-04-17 DIAGNOSIS — Z79899 Other long term (current) drug therapy: Secondary | ICD-10-CM | POA: Diagnosis not present

## 2016-04-17 DIAGNOSIS — F79 Unspecified intellectual disabilities: Secondary | ICD-10-CM

## 2016-04-17 DIAGNOSIS — F251 Schizoaffective disorder, depressive type: Secondary | ICD-10-CM | POA: Diagnosis not present

## 2016-04-17 DIAGNOSIS — F919 Conduct disorder, unspecified: Secondary | ICD-10-CM | POA: Diagnosis present

## 2016-04-17 LAB — COMPREHENSIVE METABOLIC PANEL
ALK PHOS: 41 U/L (ref 38–126)
ALT: 10 U/L — AB (ref 14–54)
AST: 26 U/L (ref 15–41)
Albumin: 4.6 g/dL (ref 3.5–5.0)
Anion gap: 8 (ref 5–15)
BILIRUBIN TOTAL: 0.2 mg/dL — AB (ref 0.3–1.2)
BUN: 9 mg/dL (ref 6–20)
CO2: 24 mmol/L (ref 22–32)
CREATININE: 0.74 mg/dL (ref 0.44–1.00)
Calcium: 9.4 mg/dL (ref 8.9–10.3)
Chloride: 106 mmol/L (ref 101–111)
GFR calc Af Amer: >60 mL/min (ref >60)
Glucose, Bld: 146 mg/dL — ABNORMAL HIGH (ref 65–99)
Potassium: 3.5 mmol/L (ref 3.5–5.1)
Sodium: 138 mmol/L (ref 135–145)
TOTAL PROTEIN: 8 g/dL (ref 6.5–8.1)

## 2016-04-17 LAB — CBC WITH DIFFERENTIAL/PLATELET
BASOS ABS: 0 10*3/uL (ref 0–0.1)
Basophils Relative: 0 %
Eosinophils Absolute: 0 10*3/uL (ref 0–0.7)
Eosinophils Relative: 0 %
HEMATOCRIT: 35.9 % (ref 35.0–47.0)
Hemoglobin: 11.7 g/dL — ABNORMAL LOW (ref 12.0–16.0)
LYMPHS PCT: 41 %
Lymphs Abs: 2.7 10*3/uL (ref 1.0–3.6)
MCH: 25.8 pg — ABNORMAL LOW (ref 26.0–34.0)
MCHC: 32.5 g/dL (ref 32.0–36.0)
MCV: 79.2 fL — AB (ref 80.0–100.0)
MONO ABS: 0.3 10*3/uL (ref 0.2–0.9)
Monocytes Relative: 5 %
NEUTROS ABS: 3.4 10*3/uL (ref 1.4–6.5)
Neutrophils Relative %: 54 %
Platelets: 170 10*3/uL (ref 150–440)
RBC: 4.53 MIL/uL (ref 3.80–5.20)
RDW: 16.3 % — ABNORMAL HIGH (ref 11.5–14.5)
WBC: 6.4 10*3/uL (ref 3.6–11.0)

## 2016-04-17 LAB — SALICYLATE LEVEL: Salicylate Lvl: <4 mg/dL (ref 2.8–30.0)

## 2016-04-17 LAB — ACETAMINOPHEN LEVEL: Acetaminophen (Tylenol), Serum: <10 ug/mL — ABNORMAL LOW (ref 10–30)

## 2016-04-17 LAB — ETHANOL: Alcohol, Ethyl (B): <5 mg/dL (ref <5)

## 2016-04-17 LAB — TSH: TSH: 3.523 u[IU]/mL (ref 0.350–4.500)
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
## 2016-04-17 NOTE — ED Notes (Signed)
Report given to Noel, RN 
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
## 2016-04-17 NOTE — ED Notes (Signed)
Clinton SawyerByron White, caregiver for pt, called for pick up in 10 minutes, clothes to pt
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
## 2016-04-17 NOTE — ED Notes (Signed)
Meal tray given to patient.

## 2016-04-17 NOTE — Discharge Instructions (Signed)
  Adjustment Disorder Adjustment disorder is an unusually severe reaction to a stressful life event, such as the loss of a job or physical illness. The event may be any stressful event other than the loss of a loved one. Adjustment disorder may affect your feelings, your thinking, how you act, or a combination of these. It may interfere with personal relationships or with the way you are at work, school, or home. People with this disorder are at risk for suicide and substance abuse. They may develop a more serious mental disorder, such as major depressive disorder or post-traumatic stress disorder. SIGNS AND SYMPTOMS  Symptoms may include:  Sadness, depressed mood, or crying spells.  Loss of enjoyment.  Change in appetite or weight.  Sense of loss or hopelessness.  Thoughts of suicide.  Anxiety, worry, or nervousness.  Trouble sleeping.  Avoiding family and friends.  Poor school performance.  Fighting or vandalism.  Reckless driving.  Skipping school.  Poor work performance.  Ignoring bills. Symptoms of adjustment disorder start within 3 months of the stressful life event. They do not last more than 6 months after the event has ended. DIAGNOSIS  To make a diagnosis, your health care provider will ask about what has happened in your life and how it has affected you. He or she may also ask about your medical history and use of medicines, alcohol, and other substances. Your health care provider may do a physical exam and order lab tests or other studies. You may be referred to a mental health specialist for evaluation. TREATMENT  Treatment options include:  Counseling or talk therapy. Talk therapy is usually provided by mental health specialists.  Medicine. Certain medicines may help with depression, anxiety, and sleep.  Support groups. Support groups offer emotional support, advice, and guidance. They are made up of people who have had similar experiences. HOME CARE  INSTRUCTIONS  Keep all follow-up visits as directed by your health care provider. This is important.  Take medicines only as directed by your health care provider. SEEK MEDICAL CARE IF:  Your symptoms get worse.  SEEK IMMEDIATE MEDICAL CARE IF: You have serious thoughts about hurting yourself or someone else. MAKE SURE YOU:  Understand these instructions.  Will watch your condition.  Will get help right away if you are not doing well or get worse.   This information is not intended to replace advice given to you by your health care provider. Make sure you discuss any questions you have with your health care provider.   Document Released: 07/26/2006 Document Revised: 12/12/2014 Document Reviewed: 04/15/2014 Elsevier Interactive Patient Education 2016 Elsevier Inc[employer]  
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
## 2016-05-24 ENCOUNTER — Ambulatory Visit: Payer: Self-pay | Admitting: Obstetrics and Gynecology

## 2016-06-14 ENCOUNTER — Emergency Department
Admission: EM | Admit: 2016-06-14 | Discharge: 2016-06-14 | Disposition: A | Discharge status: Home / Self Care | Payer: Medicaid Other | Attending: Emergency Medicine | Admitting: Emergency Medicine

## 2016-06-14 ENCOUNTER — Encounter: Payer: Self-pay | Admitting: Emergency Medicine

## 2016-06-14 DIAGNOSIS — F329 Major depressive disorder, single episode, unspecified: Secondary | ICD-10-CM | POA: Insufficient documentation

## 2016-06-14 DIAGNOSIS — F251 Schizoaffective disorder, depressive type: Secondary | ICD-10-CM | POA: Insufficient documentation

## 2016-06-14 DIAGNOSIS — F603 Borderline personality disorder: Secondary | ICD-10-CM | POA: Diagnosis present

## 2016-06-14 DIAGNOSIS — F79 Unspecified intellectual disabilities: Secondary | ICD-10-CM | POA: Diagnosis not present

## 2016-06-14 DIAGNOSIS — F919 Conduct disorder, unspecified: Secondary | ICD-10-CM | POA: Insufficient documentation

## 2016-06-14 DIAGNOSIS — Z79899 Other long term (current) drug therapy: Secondary | ICD-10-CM | POA: Insufficient documentation

## 2016-06-14 DIAGNOSIS — R4689 Other symptoms and signs involving appearance and behavior: Secondary | ICD-10-CM

## 2016-06-14 LAB — URINE DRUG SCREEN, QUALITATIVE (ARMC ONLY)
Amphetamines, Ur Screen: NOT DETECTED
BARBITURATES, UR SCREEN: NOT DETECTED
BENZODIAZEPINE, UR SCRN: NOT DETECTED
COCAINE METABOLITE, UR ~~LOC~~: NOT DETECTED
Cannabinoid 50 Ng, Ur ~~LOC~~: NOT DETECTED
MDMA (ECSTASY) UR SCREEN: NOT DETECTED
METHADONE SCREEN, URINE: NOT DETECTED
Opiate, Ur Screen: NOT DETECTED
Phencyclidine (PCP) Ur S: NOT DETECTED
TRICYCLIC, UR SCREEN: NOT DETECTED

## 2016-06-14 LAB — CBC
HCT: 34.9 % — ABNORMAL LOW (ref 35.0–47.0)
HEMOGLOBIN: 11.7 g/dL — AB (ref 12.0–16.0)
MCH: 25.9 pg — ABNORMAL LOW (ref 26.0–34.0)
MCHC: 33.6 g/dL (ref 32.0–36.0)
MCV: 77.3 fL — ABNORMAL LOW (ref 80.0–100.0)
PLATELETS: 207 10*3/uL (ref 150–440)
RBC: 4.51 MIL/uL (ref 3.80–5.20)
RDW: 14.6 % — ABNORMAL HIGH (ref 11.5–14.5)
WBC: 10.1 10*3/uL (ref 3.6–11.0)

## 2016-06-14 LAB — COMPREHENSIVE METABOLIC PANEL
ALT: 21 U/L (ref 14–54)
ANION GAP: 8 (ref 5–15)
AST: 27 U/L (ref 15–41)
Albumin: 3.9 g/dL (ref 3.5–5.0)
Alkaline Phosphatase: 40 U/L (ref 38–126)
BUN: 9 mg/dL (ref 6–20)
CHLORIDE: 99 mmol/L — AB (ref 101–111)
CO2: 30 mmol/L (ref 22–32)
Calcium: 9.2 mg/dL (ref 8.9–10.3)
Creatinine, Ser: 0.52 mg/dL (ref 0.44–1.00)
GFR calc non Af Amer: >60 mL/min (ref >60)
Glucose, Bld: 91 mg/dL (ref 65–99)
POTASSIUM: 3.8 mmol/L (ref 3.5–5.1)
SODIUM: 137 mmol/L (ref 135–145)
Total Bilirubin: 0.3 mg/dL (ref 0.3–1.2)
Total Protein: 7.8 g/dL (ref 6.5–8.1)

## 2016-06-14 LAB — ACETAMINOPHEN LEVEL

## 2016-06-14 LAB — ETHANOL

## 2016-06-14 LAB — SALICYLATE LEVEL

## 2016-06-14 LAB — POCT PREGNANCY, URINE: Preg Test, Ur: NEGATIVE
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
## 2016-06-14 NOTE — ED Provider Notes (Signed)
Patient cleared by psychiatry for discharge. IVC lifted.  Nita Sicklearolina Aja Bolander, MD 06/14/16 [id number]

## 2016-06-14 NOTE — ED Notes (Addendum)
Pt to ed with police officer who reports IVC papers are on the way to hospital.  Pt c/o aggressive behavior today at group home,  Pt states she [facility] and came home to find her belongings packed up and was told this am that she was going to be moved to another group home and this stressed her.  Pt states she got a fork and was threatening to stab herself.  Pt then states she tried to get a belt and hang herself.  Pt currently denies si, denies hi, denies hallucinations.  Pt states "I wish I had just stayed calm and just left"  Pt reports regretful of behavior.  Pt alert and oriented at this time.

## 2016-06-14 NOTE — Discharge Instructions (Signed)
You have been seen in the Emergency Department (ED)  today for a psychiatric complaint.  You have been evaluated by psychiatry and we believe you are safe to be discharged from the hospital.   ° °Please return to the Emergency Department (ED)  immediately if you have ANY thoughts of hurting yourself or anyone else, so that we may help you. ° °Please avoid alcohol and drug use. ° °Follow up with your doctor and/or therapist as soon as possible regarding today's ED  visit.  ° °You may call crisis hotline for [place] at [phone]. ° °

## 2016-06-14 NOTE — ED Notes (Signed)
Notified caregiver at group home of pt's impending d/c and need for pick-ap and transportation to group home. Caregiver verbalizes understanding and stated he would arrive here in about 10-15 minutes. Caregiver informed that pt would be dressed and waiting.

## 2016-09-06 ENCOUNTER — Ambulatory Visit (INDEPENDENT_AMBULATORY_CARE_PROVIDER_SITE_OTHER): Payer: Medicaid Other | Admitting: Obstetrics and Gynecology

## 2016-09-06 ENCOUNTER — Encounter: Payer: Self-pay | Admitting: Obstetrics and Gynecology

## 2016-09-06 VITALS — BP 142/78 | HR 81 | Ht 64.0 in | Wt 163.2 lb

## 2016-09-06 DIAGNOSIS — N939 Abnormal uterine and vaginal bleeding, unspecified: Secondary | ICD-10-CM | POA: Diagnosis not present

## 2016-09-06 DIAGNOSIS — R102 Pelvic and perineal pain: Secondary | ICD-10-CM

## 2016-09-06 DIAGNOSIS — Z30431 Encounter for routine checking of intrauterine contraceptive device: Secondary | ICD-10-CM | POA: Diagnosis not present

## 2016-09-06 LAB — POCT URINALYSIS DIPSTICK
Bilirubin, UA: NEGATIVE
Glucose, UA: NEGATIVE
Ketones, UA: NEGATIVE
Leukocytes, UA: NEGATIVE
NITRITE UA: NEGATIVE
PROTEIN UA: NEGATIVE
UROBILINOGEN UA: NEGATIVE
pH, UA: 6

## 2016-09-06 NOTE — Addendum Note (Signed)
Addended by: Marchelle FolksMILLER, Jackalyn Haith G on: 09/06/2016 08:56 AM   Modules accepted: Orders

## 2016-09-06 NOTE — Patient Instructions (Signed)
1. Urine culture is obtained to rule out bladder infection. Screening for Chlamydia and gonorrhea was also done. 2. Pelvic ultrasound is ordered to assess IUD location 3. Menstrual calendar monitoring is recommended to assess cycle pattern 4. Return as needed if abnormal uterine bleeding persists 5. Results of ultrasound will be made available and return appointment will be made if necessary

## 2016-09-06 NOTE — Progress Notes (Signed)
GYN ENCOUNTER NOTE  Subjective:       Krista [name] is a 35 y.o. G29P1001 female is here for gynecologic evaluation of the following issues:  1. Abnormal uterine bleeding 2. IUD check  Patient presents for evaluation of abnormal uterine bleeding. She had Mirena IUD insertion in March 2017. Patient was amenorrheic until the beginning of October where she has had several days of bleeding. Bleeding is. Like. She also is having pelvic cramping and burning when she urinates. She did have sex once during a day program in [place]-? Supervision.     Gynecologic History Patient's last menstrual period was 09/04/2016 (exact date). Contraception: IUD  Obstetric History OB History  Gravida Para Term Preterm AB Living  1 1 1     1   SAB TAB Ectopic Multiple Live Births          1    # Outcome Date GA Lbr Len/2nd Weight Sex Delivery Anes PTL Lv  1 Term 2011    F CS-LTranv   LIV      Past Medical History:  Diagnosis Date  . Anxiety   . Depression   . HSV infection   . Mental retardation   . Schizophrenia [facility])     Past Surgical History:  Procedure Laterality Date  . CESAREAN SECTION      Current Outpatient Prescriptions on File Prior to Visit  Medication Sig Dispense Refill  . cetirizine (ZYRTEC) 10 MG tablet Take 10 mg by mouth daily.    . citalopram (CELEXA) 20 MG tablet Take 1 tablet (20 mg total) by mouth daily. 30 tablet 0  . diphenhydrAMINE (BENADRYL) 50 MG capsule Take 50 mg by mouth Nightly.    . divalproex (DEPAKOTE) 500 MG DR tablet Take 500 mg by mouth 2 (two) times daily.    . haloperidol (HALDOL) 5 MG tablet Take 1 tablet (5 mg total) by mouth at bedtime. 30 tablet 0   No current facility-administered medications on file prior to visit.     No Known Allergies  Social History   Social History  . Marital status: Significant Other    Spouse name: N/A  . Number of children: N/A  . Years of education: N/A   Occupational History  . Not on file.   Social  History Main Topics  . Smoking status: Never Smoker  . Smokeless tobacco: Never Used  . Alcohol use No  . Drug use: No  . Sexual activity: Yes    Birth control/ protection: IUD   Other Topics Concern  . Not on file   Social History Narrative  . No narrative on file    Family History  Problem Relation Age of Onset  . Cancer Neg Hx     The following portions of the patient's history were reviewed and updated as appropriate: allergies, current medications, past family history, past medical history, past social history, past surgical history and problem list.  Review of Systems Review of Systems - per history of present illness  Objective:   BP (!) 142/78   Pulse 81   Ht 5\' 4"  (1.626 m)   Wt 163 lb 3.2 oz (74 kg)   LMP 09/04/2016 (Exact Date)   BMI 28.01 kg/m  CONSTITUTIONAL: Well-developed, well-nourished female in no acute distress. Mental differences present HENT:  Normocephalic, atraumatic.  NECK: Not examined SKIN: Skin is warm and dry. No rash noted. Not diaphoretic. No erythema. No pallor. NEUROLGIC: Alert and oriented to person, place, and time. PSYCHIATRIC: Normal mood and  affect. Normal behavior. Normal judgment and thought content. CARDIOVASCULAR:Not Examined RESPIRATORY: Not Examined BREASTS: Not Examined ABDOMEN: Soft, non distended; Non tender.  No Organomegaly. PELVIC:  External Genitalia: Normal  BUS: Normal  Vagina: Normal; IUD string 4 cm; minimal old black blood in vault  Cervix: Cervical motion tenderness 1/4; no lesions; IUD not palpable  Uterus: Normal size, shape,consistency, mobile, midplane, nontender  Adnexa: Normal; nonpalpable and nontender  RV: Normal external exam  Bladder: Nontender MUSCULOSKELETAL: Normal range of motion. No tenderness.  No cyanosis, clubbing, or edema.     Assessment:   1. Abnormal uterine bleeding 2. Dysuria 3. Unprotected sex 4. Mental differences   Plan:   1. Urine culture and CT/GC NAAT 2. Pelvic  ultrasound 3. Menstrual calendar monitoring 4. Patient will be notified of ultrasound results and call back if necessary for further management  A total of 15 minutes were spent face-to-face with the patient during this encounter and over half of that time dealt with counseling and coordination of care.  Herold HarmsMartin A Kimora Stankovic, MD  Note: This dictation was prepared with Dragon dictation along with smaller phrase technology. Any transcriptional errors that result from this process are unintentional.

## 2016-09-08 ENCOUNTER — Ambulatory Visit (INDEPENDENT_AMBULATORY_CARE_PROVIDER_SITE_OTHER): Payer: Medicaid Other

## 2016-09-08 DIAGNOSIS — R102 Pelvic and perineal pain: Secondary | ICD-10-CM | POA: Diagnosis not present

## 2016-09-08 DIAGNOSIS — Z30431 Encounter for routine checking of intrauterine contraceptive device: Secondary | ICD-10-CM | POA: Diagnosis not present

## 2016-09-08 DIAGNOSIS — N939 Abnormal uterine and vaginal bleeding, unspecified: Secondary | ICD-10-CM

## 2016-09-08 LAB — GC/CHLAMYDIA PROBE AMP
CHLAMYDIA, DNA PROBE: NEGATIVE
NEISSERIA GONORRHOEAE BY PCR: NEGATIVE

## 2016-09-08 LAB — URINE CULTURE

## 2016-09-11 ENCOUNTER — Emergency Department
Admission: EM | Admit: 2016-09-11 | Discharge: 2016-09-11 | Disposition: A | Discharge status: Home / Self Care | Payer: Medicaid Other | Attending: Emergency Medicine | Admitting: Emergency Medicine

## 2016-09-11 ENCOUNTER — Encounter: Payer: Self-pay | Admitting: Emergency Medicine

## 2016-09-11 DIAGNOSIS — Z79899 Other long term (current) drug therapy: Secondary | ICD-10-CM | POA: Diagnosis not present

## 2016-09-11 DIAGNOSIS — H1031 Unspecified acute conjunctivitis, right eye: Secondary | ICD-10-CM | POA: Insufficient documentation

## 2016-09-11 DIAGNOSIS — H5711 Ocular pain, right eye: Secondary | ICD-10-CM | POA: Diagnosis present

## 2016-09-11 MED ORDER — FLUORESCEIN SODIUM 1 MG OP STRP
ORAL_STRIP | OPHTHALMIC | Status: AC
  Filled 2016-09-11: qty 1

## 2016-09-11 MED ORDER — TETRACAINE HCL 0.5 % OP SOLN
OPHTHALMIC | Status: AC
  Filled 2016-09-11: qty 2

## 2016-09-11 MED ORDER — HYPROMELLOSE (GONIOSCOPIC) 2.5 % OP SOLN: 1.0000 [drp] | Freq: Four times a day (QID) | OPHTHALMIC | 12 refills | Status: DC

## 2016-09-11 NOTE — ED Notes (Signed)
Pt reports blurred vision today and swelling to right eye.  Pt applied glue and false eyelashes yesterday.  States swelling to right eye began today.

## 2016-09-11 NOTE — Discharge Instructions (Signed)
Please seek medical attention for any high fevers, chest pain, shortness of breath, change in behavior, persistent vomiting, bloody stool or any other new or concerning symptoms.  

## 2016-09-11 NOTE — ED Triage Notes (Signed)
Patient used finger nail glue to apply false eye lashes yesterday. Today when she removed the eye lashes she started having swelling and pain to her right eye.

## 2016-09-11 NOTE — ED Provider Notes (Signed)
[facility] Emergency Department Provider Note    ____________________________________________   I have reviewed the triage vital signs and the nursing notes.   HISTORY  Chief Complaint Eye Pain   History limited by: Not Limited   HPI Krista [name] is a 35 y.o. female who presents to the emergency department today because of concerns for right eye pain and swelling. The patient states that yesterday she used fingernail glue to apply take eyelashes to her eyes. Today she was removing her eyelashes when she noticed some swelling. She has a little bit in the left but the right is more severe. She states she is having some blurry vision in the right eye. She is having a small amount of pain and watering of the right eye.   Past Medical History:  Diagnosis Date  . Anxiety   . Depression   . HSV infection   . Mental retardation   . Schizophrenia [facility](HCC)     Patient Active Problem List   Diagnosis Date Noted  . IUD check up 09/06/2016  . Pelvic pain 09/06/2016  . Abnormal uterine bleeding (AUB) 09/06/2016  . Suicidal behavior 03/08/2016  . IUD contraception 03/02/2016  . Herpes genitalis 12/28/2015  . Borderline personality disorder 09/16/2015  . Intellectual disability 09/16/2015  . Schizoaffective disorder, depressive type (HCC) 09/16/2015  . Mental retardation 05/29/2015    Past Surgical History:  Procedure Laterality Date  . CESAREAN SECTION      Prior to Admission medications   Medication Sig Start Date End Date Taking? Authorizing Provider  cetirizine (ZYRTEC) 10 MG tablet Take 10 mg by mouth daily.   Yes Historical Provider, MD  citalopram (CELEXA) 20 MG tablet Take 1 tablet (20 mg total) by mouth daily. 03/14/16  Yes Jolanta B Pucilowska, MD  diphenhydrAMINE (BENADRYL) 50 MG capsule Take 50 mg by mouth Nightly.   Yes Historical Provider, MD  divalproex (DEPAKOTE) 500 MG DR tablet Take 500 mg by mouth 2 (two) times daily.   Yes  Historical Provider, MD  haloperidol (HALDOL) 5 MG tablet Take 1 tablet (5 mg total) by mouth at bedtime. 03/14/16  Yes Jolanta B Pucilowska, MD  traZODone (DESYREL) 50 MG tablet Take 50 mg by mouth.   Yes Historical Provider, MD  valACYclovir (VALTREX) 1000 MG tablet Take 1,000 mg by mouth. 07/17/16 07/17/17 Yes Historical Provider, MD    Allergies Review of patient's allergies indicates no known allergies.  Family History  Problem Relation Age of Onset  . Cancer Neg Hx     Social History Social History  Substance Use Topics  . Smoking status: Never Smoker  . Smokeless tobacco: Never Used  . Alcohol use No    Review of Systems  Constitutional: Negative for fever. Cardiovascular: Negative for chest pain. Respiratory: Negative for shortness of breath. Gastrointestinal: Negative for abdominal pain, vomiting and diarrhea. Neurological: Negative for headaches, focal weakness or numbness.  10-point ROS otherwise negative.  ____________________________________________   PHYSICAL EXAM:  VITAL SIGNS: ED Triage Vitals  Enc Vitals Group     BP 09/11/16 2100 126/85     Pulse Rate 09/11/16 2100 78     Resp 09/11/16 2100 18     Temp 09/11/16 2100 97.7 F (36.5 C)     Temp Source 09/11/16 2100 Oral     SpO2 09/11/16 2100 100 %     Weight 09/11/16 2101 163 lb (73.9 kg)     Height 09/11/16 2101 5\' 4"  (1.626 m)     Head  Circumference --      Peak Flow --      Pain Score 09/11/16 2101 10   Constitutional: Alert and oriented. Well appearing and in no distress. Eyes: Right conjunctiva slightly injected. Some swelling to periorbital tissue. Pupils equal round and reactive. No corneal abrasions seen on fluorescein staining. ENT   Head: Normocephalic and atraumatic.   Nose: No congestion/rhinnorhea.   Mouth/Throat: Mucous membranes are moist.   Neck: No stridor. Hematological/Lymphatic/Immunilogical: No cervical lymphadenopathy. Cardiovascular: Normal rate, regular  rhythm.  No murmurs, rubs, or gallops. Respiratory: Normal respiratory effort without tachypnea nor retractions. Breath sounds are clear and equal bilaterally. No wheezes/rales/rhonchi. Gastrointestinal: Soft and nontender. No distention.  Genitourinary: Deferred Musculoskeletal: Normal range of motion in all extremities. No lower extremity edema. Neurologic:  Normal speech and language. No gross focal neurologic deficits are appreciated.  Skin:  Skin is warm, dry and intact. No rash noted. Psychiatric: Mood and affect are normal. Speech and behavior are normal. Patient exhibits appropriate insight and judgment.  ____________________________________________    LABS (pertinent positives/negatives)  None  ____________________________________________   EKG  None  ____________________________________________    RADIOLOGY  None  ____________________________________________   PROCEDURES  Procedures  ____________________________________________   INITIAL IMPRESSION / ASSESSMENT AND PLAN / ED COURSE  Pertinent labs & imaging results that were available during my care of the patient were reviewed by me and considered in my medical decision making (see chart for details).  Patient presented because of concerns for eye irritation swelling and pain after using fingernail glue to fix eyelashes yesterday. On exam patient did not have any corneal abrasions. There was some conjunctival injection. Vision was equal bilaterally. Discussed with Dr. Dione Booze with ophthalmology. He thought at this point likely irritation. Recommended artificial tears. Will plan on discharging. Will give patient ophthalmology follow-up information. Discussed return precautions. ____________________________________________   FINAL CLINICAL IMPRESSION(S) / ED DIAGNOSES  Final diagnoses:  Acute conjunctivitis of right eye, unspecified acute conjunctivitis type     Note: This dictation was prepared with  Dragon dictation. Any transcriptional errors that result from this process are unintentional    Phineas Semen, MD 09/11/16 2310

## 2016-09-29 ENCOUNTER — Telehealth: Payer: Self-pay

## 2016-09-29 MED ORDER — VALACYCLOVIR HCL 1 G PO TABS: 1000.0000 mg | ORAL_TABLET | Freq: Every day | ORAL | 3 refills | Status: DC

## 2016-09-29 NOTE — Telephone Encounter (Signed)
Signal aware valtrex erx.

## 2017-07-11 ENCOUNTER — Other Ambulatory Visit: Payer: Self-pay

## 2017-07-11 MED ORDER — VALACYCLOVIR HCL 1 G PO TABS: 1000.0000 mg | ORAL_TABLET | Freq: Every day | ORAL | 3 refills | Status: AC

## 2018-02-08 ENCOUNTER — Ambulatory Visit: Payer: Medicaid Other | Admitting: Obstetrics and Gynecology

## 2018-02-08 ENCOUNTER — Encounter: Payer: Self-pay | Admitting: Obstetrics and Gynecology

## 2018-02-08 VITALS — BP 113/76 | HR 65 | Ht 64.0 in | Wt 174.6 lb

## 2018-02-08 DIAGNOSIS — N939 Abnormal uterine and vaginal bleeding, unspecified: Secondary | ICD-10-CM | POA: Diagnosis not present

## 2018-02-08 DIAGNOSIS — R102 Pelvic and perineal pain: Secondary | ICD-10-CM

## 2018-02-08 DIAGNOSIS — Z30430 Encounter for insertion of intrauterine contraceptive device: Secondary | ICD-10-CM

## 2018-02-08 DIAGNOSIS — R300 Dysuria: Secondary | ICD-10-CM | POA: Diagnosis not present

## 2018-02-08 DIAGNOSIS — Z202 Contact with and (suspected) exposure to infections with a predominantly sexual mode of transmission: Secondary | ICD-10-CM | POA: Diagnosis not present

## 2018-02-08 LAB — POCT URINALYSIS DIPSTICK
BILIRUBIN UA: NEGATIVE
Glucose, UA: NEGATIVE
Ketones, UA: NEGATIVE
LEUKOCYTES UA: NEGATIVE
Nitrite, UA: NEGATIVE
ODOR: NEGATIVE
PH UA: 7 (ref 5.0–8.0)
PROTEIN UA: NEGATIVE
Spec Grav, UA: 1.01 (ref 1.010–1.025)
UROBILINOGEN UA: 0.2 U/dL

## 2018-02-08 MED ORDER — AZITHROMYCIN 500 MG PO TABS: 1000.0000 mg | ORAL_TABLET | Freq: Once | ORAL | 1 refills | Status: AC

## 2018-02-08 NOTE — Patient Instructions (Addendum)
1.  Urinalysis today shows some blood in the urine. 2.  Urine culture is sent to rule out bladder infection 3.  Pelvic exam is notable for blood from the cervix which is consistent with probable menses 4.  Because there was tenderness on pelvic exam, will culture to rule out STD was performed 5.  Zithromax 1 g orally is given for possible STD and possible urinary tract infection 6.  Results from cultures will be made available.  Azithromycin oral suspension (immediate release) What is this medicine? AZITHROMYCIN (az ith roe MYE sin) is a macrolide antibiotic. It is used to treat or prevent certain kinds of bacterial infections. It will not work for colds, flu, or other viral infections. This medicine may be used for other purposes; ask your health care provider or pharmacist if you have questions. COMMON BRAND NAME(S): Zithromax What should I tell my health care provider before I take this medicine? They need to know if you have any of these conditions: -heart disease -history of irregular heartbeat -kidney disease -liver disease -myasthenia gravis -an unusual or allergic reaction to azithromycin, erythromycin, other macrolide antibiotics, foods, dyes, or preservatives -pregnant or trying to get pregnant -breast-feeding How should I use this medicine? Take this medicine by mouth. Follow the directions on the prescription label. For the suspension already mixed by the pharmacist: Shake well before using. This medicine can be taken with food or on an empty stomach. If the medicine upsets your stomach, take it with food. Use a specially marked spoon, or container to measure the dose. Ask your pharmacist if you do not have one. Household spoons are not accurate. Take your medicine at regular intervals. Do not take your medicine more often than directed. Take all of your medicine as directed even if you think that you are better. Do not skip doses or stop your medicine early. For the 1 gram  single dose packet: This medicine can be taken with food or on an empty stomach. Empty the contents of a single dose packet into two ounces of water (about one quarter of a full glass). Mix and drink all the mixture at once. Add another two ounces of water to the glass, mix well and drink all of it, to make sure you take the full dose. Talk to your pediatrician regarding the use of this medicine in children. While this drug may be prescribed for infants as young as 6 months for selected conditions, precautions do apply. Overdosage: If you think you have taken too much of this medicine contact a poison control center or emergency room at once. NOTE: This medicine is only for you. Do not share this medicine with others. What if I miss a dose? If you miss a dose, take it as soon as you can. If it is almost time for your next dose, take only that dose. Do not take double or extra doses. What may interact with this medicine? Do not take this medicine with any of the following medications: -lincomycin This medicine may also interact with the following medications: -amiodarone -antacids -birth control pills -cyclosporine -digoxin -magnesium -nelfinavir -phenytoin -warfarin This list may not describe all possible interactions. Give your health care provider a list of all the medicines, herbs, non-prescription drugs, or dietary supplements you use. Also tell them if you smoke, drink alcohol, or use illegal drugs. Some items may interact with your medicine. What should I watch for while using this medicine? Tell your doctor or healthcare professional if your symptoms do  not start to get better or if they get worse. Do not treat diarrhea with over the counter products. Contact your doctor if you have diarrhea that lasts more than 2 days or if it is severe and watery. Contact your doctor if vomiting and fussiness with feeding occurs in a treated infant. This medicine can make you more sensitive to the  sun. Keep out of the sun. If you cannot avoid being in the sun, wear protective clothing and use sunscreen. Do not use sun lamps or tanning beds/booths. What side effects may I notice from receiving this medicine? Side effects that you should report to your doctor or health care professional as soon as possible: -allergic reactions like skin rash, itching or hives, swelling of the face, lips, or tongue -confusion, nightmares or hallucinations -dark urine -difficulty breathing -hearing loss -irregular heartbeat or chest pain -pain or difficulty passing urine -redness, blistering, peeling or loosening of the skin, including inside the mouth -white patches or sores in the mouth -yellowing of the eyes or skin Side effects that usually do not require medical attention (report to your doctor or health care professional if they continue or are bothersome): -diarrhea -dizziness, drowsiness -headache -stomach upset or vomiting -tooth discoloration -vaginal irritation This list may not describe all possible side effects. Call your doctor for medical advice about side effects. You may report side effects to FDA at 1-800-FDA-1088. Where should I keep my medicine? Keep out of the reach of children. Store between 5 and 30 degrees C (41 and 86 degrees F) for up to 10 days. Throw away any unused medicine after the expiration date. NOTE: This sheet is a summary. It may not cover all possible information. If you have questions about this medicine, talk to your doctor, pharmacist, or health care provider.  2018 Elsevier/Gold Standard (2016-01-19 14:40:36)

## 2018-02-08 NOTE — Progress Notes (Signed)
Chief complaint: 1.  Abdominal pain 2.  Burning with urination 3.  Abnormal uterine bleeding 4.  Possible STD exposure  Krista [name] presents today for evaluation of acute onset lower abdominal pain, burning with urination, and abnormal uterine bleeding. Patient has schizoaffective disorder and learning differences. Patient had intercourse 1 month ago, unprotected She has the Mirena IUD for contraception. Her menses are irregular: She is currently having vaginal bleeding. Lower abdominal pain and suprapubic pain have been ongoing for an uncertain amount of time for entheses poor historian).  No flank pain is noted no fever chills or sweats are noted. Patient reports slight malodor from the vagina  Past Medical History:  Diagnosis Date  . Anxiety   . Depression   . HSV infection   . Mental retardation   . Schizophrenia [facility](HCC)    Past Surgical History:  Procedure Laterality Date  . CESAREAN SECTION      OBJECTIVE: BP 113/76   Pulse 65   Ht 5\' 4"  (1.626 m)   Wt 174 lb 9.6 oz (79.2 kg)   BMI 29.97 kg/m  Pleasant female in no acute distress Back: No CVA tenderness or spinal tenderness Abdomen: Soft, voluntary guarding present but when distracted abdomen is soft without peritoneal signs; no palpable masses; Pfannenstiel skin incision is well-healed without evidence of hernia; mild suprapubic tenderness is noted Pelvic: External genitalia normal BUS-normal Vagina-old burgundy/black blood is in the vault; no evidence of vaginal discharge Cervix-old clot at office; IUD strings 3-4 cm seen; cervical motion tenderness 2/4 Uterus-midplane, tender 2/4 (difficult interpretation due to mental confounding variables) Adnexa-nonpalpable nontender Rectovaginal-normal external exam  ASSESSMENT: 1.  New onset pelvic pain, lower abdomen and suprapubic region 2.  History of recent unprotected sex within the past month 3.  Abnormal uterine bleeding with evidence of probable old clot from menses  on exam 4.  IV strings visualized with appropriate length 5.  Mild tenderness of uterus and cervix, cannot rule out potential infection  PLAN: 1.  Urinalysis-shows hematuria 2.  Urine culture sent 3.  Nuswab plus sent 4.  Zithromax 1 g p.o. x1 dose 5.  Safe sex strongly encouraged  A total of 15 minutes were spent face-to-face with the patient during this encounter and over half of that time dealt with counseling and coordination of care.  Herold HarmsMartin A Magally Vahle, MD  Note: This dictation was prepared with Dragon dictation along with smaller phrase technology. Any transcriptional errors that result from this process are unintentional.

## 2018-02-11 LAB — NUSWAB VAGINITIS PLUS (VG+)
CANDIDA ALBICANS, NAA: NEGATIVE
CANDIDA GLABRATA, NAA: NEGATIVE
Chlamydia trachomatis, NAA: NEGATIVE
Neisseria gonorrhoeae, NAA: NEGATIVE
TRICH VAG BY NAA: NEGATIVE

## 2018-02-11 LAB — URINE CULTURE

## 2018-02-12 ENCOUNTER — Telehealth: Payer: Self-pay

## 2018-02-12 MED ORDER — AMOXICILLIN 500 MG PO CAPS: 500.0000 mg | ORAL_CAPSULE | Freq: Three times a day (TID) | ORAL | 0 refills | Status: DC

## 2018-02-12 NOTE — Telephone Encounter (Signed)
Caretaker- Signal aware. Med erx.

## 2018-02-12 NOTE — Telephone Encounter (Signed)
-----   Message from Herold HarmsMartin A Defrancesco, MD sent at 02/12/2018  8:01 AM EDT ----- Please notify - Abnormal Labs Nuswab is negative for infection-STDs. 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  I recommend treatment 500 mg amoxicillin 3 times a day for 7 days to eliminate this colonization and this may reduce her UTI symptoms

## 2018-07-27 ENCOUNTER — Emergency Department
Admission: EM | Admit: 2018-07-27 | Discharge: 2018-07-27 | Disposition: A | Discharge status: Home / Self Care | Payer: Medicaid Other | Attending: Emergency Medicine | Admitting: Emergency Medicine

## 2018-07-27 ENCOUNTER — Other Ambulatory Visit: Payer: Self-pay

## 2018-07-27 DIAGNOSIS — R079 Chest pain, unspecified: Secondary | ICD-10-CM | POA: Diagnosis not present

## 2018-07-27 DIAGNOSIS — F251 Schizoaffective disorder, depressive type: Secondary | ICD-10-CM | POA: Diagnosis not present

## 2018-07-27 DIAGNOSIS — F79 Unspecified intellectual disabilities: Secondary | ICD-10-CM | POA: Insufficient documentation

## 2018-07-27 DIAGNOSIS — Z79899 Other long term (current) drug therapy: Secondary | ICD-10-CM | POA: Insufficient documentation

## 2018-07-27 DIAGNOSIS — F329 Major depressive disorder, single episode, unspecified: Secondary | ICD-10-CM | POA: Diagnosis present

## 2018-07-27 DIAGNOSIS — R45851 Suicidal ideations: Secondary | ICD-10-CM | POA: Insufficient documentation

## 2018-07-27 DIAGNOSIS — F603 Borderline personality disorder: Secondary | ICD-10-CM | POA: Diagnosis present

## 2018-07-27 LAB — URINE DRUG SCREEN, QUALITATIVE (ARMC ONLY)
Amphetamines, Ur Screen: NOT DETECTED
BARBITURATES, UR SCREEN: NOT DETECTED
CANNABINOID 50 NG, UR ~~LOC~~: NOT DETECTED
Cocaine Metabolite,Ur ~~LOC~~: NOT DETECTED
MDMA (ECSTASY) UR SCREEN: NOT DETECTED
Methadone Scn, Ur: NOT DETECTED
Opiate, Ur Screen: NOT DETECTED
PHENCYCLIDINE (PCP) UR S: NOT DETECTED
Tricyclic, Ur Screen: NOT DETECTED

## 2018-07-27 LAB — COMPREHENSIVE METABOLIC PANEL
ALK PHOS: 35 U/L — AB (ref 38–126)
ALT: 10 U/L (ref 0–44)
AST: 19 U/L (ref 15–41)
Albumin: 3.8 g/dL (ref 3.5–5.0)
Anion gap: 8 (ref 5–15)
BILIRUBIN TOTAL: 0.8 mg/dL (ref 0.3–1.2)
BUN: 9 mg/dL (ref 6–20)
CALCIUM: 9.1 mg/dL (ref 8.9–10.3)
CO2: 27 mmol/L (ref 22–32)
CREATININE: 0.67 mg/dL (ref 0.44–1.00)
Chloride: 103 mmol/L (ref 98–111)
GFR calc Af Amer: >60 mL/min (ref >60)
Glucose, Bld: 91 mg/dL (ref 70–99)
Potassium: 3.8 mmol/L (ref 3.5–5.1)
Sodium: 138 mmol/L (ref 135–145)
TOTAL PROTEIN: 7.5 g/dL (ref 6.5–8.1)

## 2018-07-27 LAB — CBC WITH DIFFERENTIAL/PLATELET
BASOS PCT: 0 %
Basophils Absolute: 0 10*3/uL (ref 0–0.1)
EOS ABS: 0 10*3/uL (ref 0–0.7)
EOS PCT: 0 %
HCT: 36 % (ref 35.0–47.0)
Hemoglobin: 12 g/dL (ref 12.0–16.0)
LYMPHS ABS: 1.7 10*3/uL (ref 1.0–3.6)
Lymphocytes Relative: 30 %
MCH: 27.3 pg (ref 26.0–34.0)
MCHC: 33.4 g/dL (ref 32.0–36.0)
MCV: 81.8 fL (ref 80.0–100.0)
Monocytes Absolute: 0.4 10*3/uL (ref 0.2–0.9)
Monocytes Relative: 8 %
Neutro Abs: 3.4 10*3/uL (ref 1.4–6.5)
Neutrophils Relative %: 62 %
PLATELETS: 121 10*3/uL — AB (ref 150–440)
RBC: 4.41 MIL/uL (ref 3.80–5.20)
RDW: 15 % — ABNORMAL HIGH (ref 11.5–14.5)
WBC: 5.5 10*3/uL (ref 3.6–11.0)

## 2018-07-27 LAB — POCT PREGNANCY, URINE: Preg Test, Ur: NEGATIVE

## 2018-07-27 LAB — URINALYSIS, COMPLETE (UACMP) WITH MICROSCOPIC
BILIRUBIN URINE: NEGATIVE
Bacteria, UA: NONE SEEN
GLUCOSE, UA: NEGATIVE mg/dL
HGB URINE DIPSTICK: NEGATIVE
Ketones, ur: NEGATIVE mg/dL
LEUKOCYTES UA: NEGATIVE
NITRITE: NEGATIVE
Protein, ur: 100 mg/dL — AB
SPECIFIC GRAVITY, URINE: 1.015 (ref 1.005–1.030)
pH: 6 (ref 5.0–8.0)

## 2018-07-27 LAB — ETHANOL

## 2018-07-27 LAB — TROPONIN I

## 2018-07-27 NOTE — ED Provider Notes (Addendum)
-----------------------------------------   3:24 PM on 07/27/2018 -----------------------------------------   BP 98/67 (BP Location: Left Arm)   Pulse 82   Temp 98.3 F (36.8 C) (Oral)   Resp 18   Ht 1.651 m (5\' 5" )   Wt 72.6 kg   SpO2 97%   BMI 26.63 kg/m   Dr. Toni Amendlapacs evaluated the patient and feels that the patient is safe to be discharged with outpatient follow-up.  He rescinded the IVC.  The patient is hemodynamically stable and appropriate to go at this time.  The quad nurse is going to call the legal guardian to inform them of the discharge and outpatient follow-up plan.    Loleta RoseForbach, Brittanee Ghazarian, MD 07/27/18 1525    Loleta RoseForbach, Dakiya Puopolo, MD 07/27/18 [phone]

## 2018-07-27 NOTE — ED Notes (Signed)
BEHAVIORAL HEALTH ROUNDING Patient sleeping: Yes.   Patient alert and oriented: eyes closed  Appears to be asleep Behavior appropriate: Yes.  ; If no, describe:  Nutrition and fluids offered: Yes  Toileting and hygiene offered: sleeping Sitter present: q 15 minute observations and security monitoring Law enforcement present: yes   
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
## 2018-07-27 NOTE — ED Notes (Addendum)
[facility]  - contact  Signal Mitchell  [phone]  Caregiver reports she contacted Chavis Gash  [phone]= Pt contact person  Guardian paperwork placed on chart  - I was unable to contact [place] [facility] [facility]

## 2018-07-27 NOTE — ED Notes (Signed)
BEHAVIORAL HEALTH ROUNDING Patient sleeping: Yes.   Patient alert and oriented: eyes closed  Appears to be asleep Behavior appropriate: Yes.  ; If no, describe:  Nutrition and fluids offered: Yes  Toileting and hygiene offered: sleeping Sitter present: q 15 minute observations and security monitoring Law enforcement present: yes  ODS 

## 2018-07-27 NOTE — ED Provider Notes (Signed)
[facility] Emergency Department Provider Note   ____________________________________________    I have reviewed the triage vital signs and the nursing notes.   HISTORY  Chief Complaint Behavior Problem     HPI Krista [name] is a 37 y.o. female who presents with suicidal ideation.  Patient has a history of schizophrenia and depression.  Apparently she became unhappy with her group home and left early this morning with the intention of walking into traffic.  She also sliced her left wrist very superficially with an unknown object.  She arrives under IVC.  She reports symptoms started today.  She feels that she needs new group home.  Patient also states that she developed chest pain when she left today which she thinks may be related to anxiety, seems to be improving now.  No history of heart disease.  No shortness of breath   Past Medical History:  Diagnosis Date  . Anxiety   . Depression   . HSV infection   . Mental retardation   . Schizophrenia [facility](HCC)     Patient Active Problem List   Diagnosis Date Noted  . IUD check up 09/06/2016  . Pelvic pain 09/06/2016  . Abnormal uterine bleeding (AUB) 09/06/2016  . Suicidal behavior 03/08/2016  . IUD contraception 03/02/2016  . Herpes genitalis 12/28/2015  . Borderline personality disorder (HCC) 09/16/2015  . Intellectual disability 09/16/2015  . Schizoaffective disorder, depressive type (HCC) 09/16/2015  . Mental retardation 05/29/2015    Past Surgical History:  Procedure Laterality Date  . CESAREAN SECTION      Prior to Admission medications   Medication Sig Start Date End Date Taking? Authorizing Provider  acetaminophen (TYLENOL) 325 MG tablet Take 650 mg by mouth every 4 (four) hours as needed for mild pain or moderate pain.   Yes [provider]  ARIPiprazole ER (ABILIFY MAINTENA) 400 MG SRER injection Inject 400 mg into the muscle every 30 (thirty) days. On the first of each  month   Yes [provider]  cetirizine (ZYRTEC) 10 MG tablet Take 10 mg by mouth daily as needed for allergies or rhinitis.   Yes [provider]  citalopram (CELEXA) 20 MG tablet Take 1 tablet (20 mg total) by mouth daily. 03/14/16  Yes Pucilowska, Jolanta B, MD  clonazePAM (KLONOPIN) 1 MG tablet Take 1 mg by mouth at bedtime.    Yes [provider]  diphenhydrAMINE (BENADRYL) 50 MG capsule Take 50 mg by mouth 3 (three) times daily as needed for itching or allergies.    Yes [provider]  divalproex (DEPAKOTE) 500 MG DR tablet Take 500 mg by mouth 3 (three) times daily.    Yes [provider]  docusate sodium (COLACE) 50 MG capsule Take 50 mg by mouth at bedtime as needed for mild constipation or moderate constipation.   Yes [provider]  ferrous sulfate 325 (65 FE) MG tablet Take 325 mg by mouth daily.   Yes [provider]  haloperidol (HALDOL) 5 MG tablet Take 1 tablet (5 mg total) by mouth at bedtime. Patient taking differently: Take 5 mg by mouth 3 (three) times daily.  03/14/16  Yes Pucilowska, Jolanta B, MD  ibuprofen (ADVIL,MOTRIN) 400 MG tablet Take 400 mg by mouth every 6 (six) hours as needed for mild pain or moderate pain.   Yes [provider]  senna (SENOKOT) 8.6 MG TABS tablet Take 1 tablet by mouth at bedtime as needed for mild constipation or moderate constipation.  Yes [provider]  traZODone (DESYREL) 50 MG tablet Take 25-50 mg by mouth See admin instructions. 25 mg at 9 am, 25 mg at 3 pm, and 50 mg at bedtime   Yes [provider]  valACYclovir (VALTREX) 1000 MG tablet Take 1,000 mg by mouth daily.   Yes [provider]  Vitamin D, Ergocalciferol, (DRISDOL) 50000 units CAPS capsule Take 50,000 Units by mouth every Tuesday.    Yes [provider]     Allergies Patient has no known allergies.  Family History  Problem Relation Age of Onset  . Cancer Neg Hx      Social History Social History   Tobacco Use  . Smoking status: Never Smoker  . Smokeless tobacco: Never Used  Substance Use Topics  . Alcohol use: No  . Drug use: No    Review of Systems  Constitutional: No fever/chills Eyes: No visual changes.  ENT: No sore throat. Cardiovascular: Denies chest pain. Respiratory: Denies shortness of breath. Gastrointestinal: No abdominal pain.  No nausea, no vomiting.   Genitourinary: Negative for dysuria. Musculoskeletal: Negative for back pain. Skin: Negative for rash. Neurological: Negative for headaches or weakness   ____________________________________________   PHYSICAL EXAM:  VITAL SIGNS: ED Triage Vitals  Enc Vitals Group     BP 07/27/18 0723 98/67     Pulse Rate 07/27/18 0723 82     Resp 07/27/18 0723 18     Temp 07/27/18 0723 98.3 F (36.8 C)     Temp Source 07/27/18 0723 Oral     SpO2 07/27/18 0723 97 %     Weight 07/27/18 0721 72.6 kg (160 lb)     Height 07/27/18 0721 1.651 m (5\' 5" )     Head Circumference --      Peak Flow --      Pain Score 07/27/18 0721 3     Pain Loc --      Pain Edu? --      Excl. in GC? --     Constitutional: Alert and oriented.  Eyes: Conjunctivae are normal.   Nose: No congestion/rhinnorhea.  Cardiovascular: Normal rate, regular rhythm. Grossly normal heart sounds.  Good peripheral circulation. Respiratory: Normal respiratory effort.  No retractions. Lungs CTAB. Gastrointestinal: Soft and nontender. No distention.   Musculoskeletal: No lower extremity tenderness nor edema.  Warm and well perfused, no injuries to the lower extremity's Neurologic:  Normal speech and language. No gross focal neurologic deficits are appreciated.  Skin:  Skin is warm, dry.  Superficial laceration to the left wrist Psychiatric: Depressed mood, flat affect speech is normal  ____________________________________________   LABS (all labs ordered are listed, but only abnormal results are  displayed)  Labs Reviewed  URINALYSIS, COMPLETE (UACMP) WITH MICROSCOPIC - Abnormal; Notable for the following components:      Result Value   Color, Urine YELLOW (*)    APPearance HAZY (*)    Protein, ur 100 (*)    All other components within normal limits  COMPREHENSIVE METABOLIC PANEL - Abnormal; Notable for the following components:   Alkaline Phosphatase 35 (*)    All other components within normal limits  URINE DRUG SCREEN, QUALITATIVE ([facility] ONLY) - Abnormal; Notable for the following components:   Benzodiazepine, Ur Scrn TEST NOT PERFORMED, REAGENT NOT AVAILABLE (*)    All other components within normal limits  CBC WITH DIFFERENTIAL/PLATELET - Abnormal; Notable for the following components:   RDW 15.0 (*)    Platelets 121 (*)    All  other components within normal limits  ETHANOL  TROPONIN I  POCT PREGNANCY, URINE  POC URINE PREG, ED   ____________________________________________  EKG  ED ECG REPORT I, Jene Every, the attending physician, personally viewed and interpreted this ECG.  Date: 07/27/2018  Rhythm: normal sinus rhythm QRS Axis: normal Intervals: normal ST/T Wave abnormalities: normal Narrative Interpretation: no evidence of acute ischemia  ____________________________________________  RADIOLOGY  None ____________________________________________   PROCEDURES  Procedure(s) performed: No  Procedures   Critical Care performed: No ____________________________________________   INITIAL IMPRESSION / ASSESSMENT AND PLAN / ED COURSE  Pertinent labs & imaging results that were available during my care of the patient were reviewed by me and considered in my medical decision making (see chart for details).  Patient well-appearing in no acute distress she complains of chest pain in addition to SI.  Her EKG looks very reassuring.  Will check troponin, strongly doubt ACS.  Will complete IVC given suicidal ideation, consult psych and  TTS  ----------------------------------------- 9:59 AM on 07/27/2018 -----------------------------------------  Lab work is overall reassuring, patient is medically cleared for psychiatric consultation    ____________________________________________   FINAL CLINICAL IMPRESSION(S) / ED DIAGNOSES  Final diagnoses:  Suicidal ideation  Chest pain, unspecified type        Note:  This document was prepared using Dragon voice recognition software and may include unintentional dictation errors.    Jene Every, MD 07/27/18 [phone]

## 2018-07-27 NOTE — Discharge Instructions (Addendum)

## 2018-07-27 NOTE — ED Notes (Signed)
Pt discharge instructions given to caregiver, Signal Clovis RileyMitchell.  Caregiver verbalized understanding.  Pt denies SI, HI, and A/V hallucinations.  Appears to be stable and in no acute distress.  1:1 belonging bag returned to patient.

## 2018-07-27 NOTE — ED Notes (Signed)
Contacted Signal Clovis RileyMitchell, caregiver, and informed her that patient was ready to be discharged.  Caregiver stated that she was in [place] and was caught in traffic and that she would be here in an hour to an hour and a half.

## 2018-07-27 NOTE — Consult Note (Signed)
[facility] Consult   Reason for Consult: Consult for this patient was schizoaffective disorder and intellectual disability well-known to the psychiatric service came in from her group home complaining of anxiety and depression Referring Physician: Corky Downs Patient Identification: Krista [name] MRN:  [id number] Principal Diagnosis: Schizoaffective disorder, depressive type ([place]) Diagnosis:   Patient Active Problem List   Diagnosis Date Noted  . IUD check up [Z30.431] 09/06/2016  . Pelvic pain [R10.2] 09/06/2016  . Abnormal uterine bleeding (AUB) [N93.9] 09/06/2016  . Suicidal behavior [R46.89] 03/08/2016  . IUD contraception [Z97.5] 03/02/2016  . Herpes genitalis [A60.00] 12/28/2015  . Borderline personality disorder ([place]) [F60.3] 09/16/2015  . Intellectual disability [F79] 09/16/2015  . Schizoaffective disorder, depressive type ([place]) [F25.1] 09/16/2015  . Mental retardation [F79] 05/29/2015    Total Time spent with patient: 1 hour  Subjective:   Krista [name] is a 37 y.o. female patient admitted with "I am not feeling good".  HPI: Patient seen chart reviewed.  This is a 37 year old woman with schizoaffective disorder and mild intellectual disability who comes in from her group home after becoming agitated this morning.  Patient says she got up early this morning and got into an argument with 1 of the staff members.  She says that the staff member used insulting language towards her which was so bad that she cannot even repeated it.  She got upset and cut herself on the arm although the cut she displays is so superficial as to be almost invisible.  She then "ran away" from the group home by walking out into the front yard.  Patient is intermittently tearful saying that she hates being back at the group home because of how mean people are to her.  She complains of chronic hallucinations and says that her medicines are not working.  Denies any actual suicidal or  homicidal intent.  Social history: Patient has a legal guardian.  Lives in a group home.  Chronic difficulty in adjustment.  Medical history: Outside of mental health issues minimal other significant medical problems  Substance abuse history: Typically not part of the ongoing problem.  Past Psychiatric History: Long history of schizoaffective disorder with difficulty at group homes.  Feelings easily hurt she will act out and get upset and come into the emergency room.  No actual history of suicide attempts minimal agitation.  Has been tried on multiple antidepressants and antipsychotics and still has some chronic degree of psychotic symptoms.  Risk to Self: Suicidal Ideation: No Suicidal Intent: No Is patient at risk for suicide?: No Suicidal Plan?: No Access to Means: No What has been your use of drugs/alcohol within the last 12 months?: pt denies use How many times?: 0 Other Self Harm Risks: cutting Triggers for Past Attempts: None known Intentional Self Injurious Behavior: Cutting Comment - Self Injurious Behavior: pt cuts inner arms with sharp objects Risk to Others: Homicidal Ideation: No Thoughts of Harm to Others: No Current Homicidal Intent: No Current Homicidal Plan: No Access to Homicidal Means: No Identified Victim: n/a History of harm to others?: No Assessment of Violence: None Noted Does patient have access to weapons?: No Criminal Charges Pending?: No Does patient have a court date: No Prior Inpatient Therapy: Prior Inpatient Therapy: Yes Prior Therapy Dates: 2017,2019 Prior Therapy Facilty/Provider(s): [facility] Reason for Treatment: a/v h/d Prior Outpatient Therapy: Prior Outpatient Therapy: Yes Prior Therapy Dates: current Prior Therapy Facilty/Provider(s): unknown Does patient have an ACCT team?: No Does patient have Intensive In-House Services?  : No Does  patient have [facility] services? : No Does patient have P4CC services?: No  Past Medical History:  Past  Medical History:  Diagnosis Date  . Anxiety   . Depression   . HSV infection   . Mental retardation   . Schizophrenia [facility])     Past Surgical History:  Procedure Laterality Date  . CESAREAN SECTION     Family History:  Family History  Problem Relation Age of Onset  . Cancer Neg Hx    Family Psychiatric  History: Nothing known about it Social History:  Social History   Substance and Sexual Activity  Alcohol Use No     Social History   Substance and Sexual Activity  Drug Use No    Social History   Socioeconomic History  . Marital status: Significant Other    Spouse name: Not on file  . Number of children: Not on file  . Years of education: Not on file  . Highest education level: Not on file  Occupational History  . Not on file  Social Needs  . Financial resource strain: Not on file  . Food insecurity:    Worry: Not on file    Inability: Not on file  . Transportation needs:    Medical: Not on file    Non-medical: Not on file  Tobacco Use  . Smoking status: Never Smoker  . Smokeless tobacco: Never Used  Substance and Sexual Activity  . Alcohol use: No  . Drug use: No  . Sexual activity: Yes    Birth control/protection: IUD, Condom  Lifestyle  . Physical activity:    Days per week: Not on file    Minutes per session: Not on file  . Stress: Not on file  Relationships  . Social connections:    Talks on phone: Not on file    Gets together: Not on file    Attends religious service: Not on file    Active member of club or organization: Not on file    Attends meetings of clubs or organizations: Not on file    Relationship status: Not on file  Other Topics Concern  . Not on file  Social History Narrative  . Not on file   Additional Social History:    Allergies:  No Known Allergies  Labs:  Results for orders placed or performed during the hospital encounter of 07/27/18 (from the past 48 hour(s))  Urinalysis, Complete w Microscopic     Status:  Abnormal   Collection Time: 07/27/18  7:38 AM  Result Value Ref Range   Color, Urine YELLOW (A) YELLOW   APPearance HAZY (A) CLEAR   Specific Gravity, Urine 1.015 1.005 - 1.030   pH 6.0 5.0 - 8.0   Glucose, UA NEGATIVE NEGATIVE mg/dL   Hgb urine dipstick NEGATIVE NEGATIVE   Bilirubin Urine NEGATIVE NEGATIVE   Ketones, ur NEGATIVE NEGATIVE mg/dL   Protein, ur 100 (A) NEGATIVE mg/dL   Nitrite NEGATIVE NEGATIVE   Leukocytes, UA NEGATIVE NEGATIVE   RBC / HPF 0-5 0 - 5 RBC/hpf   WBC, UA 0-5 0 - 5 WBC/hpf   Bacteria, UA NONE SEEN NONE SEEN   Squamous Epithelial / LPF 0-5 0 - 5   Mucus PRESENT    Hyaline Casts, UA PRESENT     Comment: Performed at [facility], [address], [place], [place] [address]  Comprehensive metabolic panel     Status: Abnormal   Collection Time: 07/27/18  7:38 AM  Result Value Ref Range  Sodium 138 135 - 145 mmol/L   Potassium 3.8 3.5 - 5.1 mmol/L   Chloride 103 98 - 111 mmol/L   CO2 27 22 - 32 mmol/L   Glucose, Bld 91 70 - 99 mg/dL   BUN 9 6 - 20 mg/dL   Creatinine, Ser 0.67 0.44 - 1.00 mg/dL   Calcium 9.1 8.9 - 10.3 mg/dL   Total Protein 7.5 6.5 - 8.1 g/dL   Albumin 3.8 3.5 - 5.0 g/dL   AST 19 15 - 41 U/L   ALT 10 0 - 44 U/L   Alkaline Phosphatase 35 (L) 38 - 126 U/L   Total Bilirubin 0.8 0.3 - 1.2 mg/dL   GFR calc non Af Amer >60 >60 mL/min   GFR calc Af Amer >60 >60 mL/min    Comment: (NOTE) The eGFR has been calculated using the CKD EPI equation. This calculation has not been validated in all clinical situations. eGFR's persistently <60 mL/min signify possible Chronic Kidney Disease.    Anion gap 8 5 - 15    Comment: Performed at [facility], [address] [place], [place] [address]  Ethanol     Status: None   Collection Time: 07/27/18  7:38 AM  Result Value Ref Range   Alcohol, Ethyl (B) <10 <10 mg/dL    Comment: (NOTE) Lowest detectable limit for serum alcohol is 10 mg/dL. For medical purposes only. Performed  at [facility], [address] [place], [place] [address]   Urine Drug Screen, Qualitative     Status: Abnormal   Collection Time: 07/27/18  7:38 AM  Result Value Ref Range   Tricyclic, Ur Screen NONE DETECTED NONE DETECTED   Amphetamines, Ur Screen NONE DETECTED NONE DETECTED   MDMA (Ecstasy)Ur Screen NONE DETECTED NONE DETECTED   Cocaine Metabolite,Ur [place] NONE DETECTED NONE DETECTED   Opiate, Ur Screen NONE DETECTED NONE DETECTED   Phencyclidine (PCP) Ur S NONE DETECTED NONE DETECTED   Cannabinoid 50 Ng, Ur [place] NONE DETECTED NONE DETECTED   Barbiturates, Ur Screen NONE DETECTED NONE DETECTED   Benzodiazepine, Ur Scrn TEST NOT PERFORMED, REAGENT NOT AVAILABLE (A) NONE DETECTED   Methadone Scn, Ur NONE DETECTED NONE DETECTED    Comment: (NOTE) Tricyclics + metabolites, urine    Cutoff 1000 ng/mL Amphetamines + metabolites, urine  Cutoff 1000 ng/mL MDMA (Ecstasy), urine              Cutoff 500 ng/mL Cocaine Metabolite, urine          Cutoff 300 ng/mL Opiate + metabolites, urine        Cutoff 300 ng/mL Phencyclidine (PCP), urine         Cutoff 25 ng/mL Cannabinoid, urine                 Cutoff 50 ng/mL Barbiturates + metabolites, urine  Cutoff 200 ng/mL Benzodiazepine, urine              Cutoff 200 ng/mL Methadone, urine                   Cutoff 300 ng/mL The urine drug screen provides only a preliminary, unconfirmed analytical test result and should not be used for non-medical purposes. Clinical consideration and professional judgment should be applied to any positive drug screen result due to possible interfering substances. A more specific alternate chemical method must be used in order to obtain a confirmed analytical result. Gas chromatography / mass spectrometry (GC/MS) is the preferred confirmat ory method.  Performed at [facility], [address] [place], [place] [address]   CBC with Diff     Status: Abnormal   Collection Time: 07/27/18  7:38 AM   Result Value Ref Range   WBC 5.5 3.6 - 11.0 K/uL   RBC 4.41 3.80 - 5.20 MIL/uL   Hemoglobin 12.0 12.0 - 16.0 g/dL   HCT 36.0 35.0 - 47.0 %   MCV 81.8 80.0 - 100.0 fL   MCH 27.3 26.0 - 34.0 pg   MCHC 33.4 32.0 - 36.0 g/dL   RDW 15.0 (H) 11.5 - 14.5 %   Platelets 121 (L) 150 - 440 K/uL   Neutrophils Relative % 62 %   Neutro Abs 3.4 1.4 - 6.5 K/uL   Lymphocytes Relative 30 %   Lymphs Abs 1.7 1.0 - 3.6 K/uL   Monocytes Relative 8 %   Monocytes Absolute 0.4 0.2 - 0.9 K/uL   Eosinophils Relative 0 %   Eosinophils Absolute 0.0 0 - 0.7 K/uL   Basophils Relative 0 %   Basophils Absolute 0.0 0 - 0.1 K/uL    Comment: Performed at [facility], [address] [place], [place] [address]  Troponin I     Status: None   Collection Time: 07/27/18  7:38 AM  Result Value Ref Range   Troponin I <0.03 <0.03 ng/mL    Comment: Performed at [facility], [address] [place], [place] [address]  Pregnancy, urine POC     Status: None   Collection Time: 07/27/18  7:46 AM  Result Value Ref Range   Preg Test, Ur NEGATIVE NEGATIVE    Comment:        THE SENSITIVITY OF THIS METHODOLOGY IS >24 mIU/mL     No current facility-administered medications for this encounter.    Current Outpatient Medications  Medication Sig Dispense Refill  . acetaminophen (TYLENOL) 325 MG tablet Take 650 mg by mouth every 4 (four) hours as needed for mild pain or moderate pain.    . ARIPiprazole ER (ABILIFY MAINTENA) 400 MG SRER injection Inject 400 mg into the muscle every 30 (thirty) days. On the first of each month    . cetirizine (ZYRTEC) 10 MG tablet Take 10 mg by mouth daily as needed for allergies or rhinitis.    . citalopram (CELEXA) 20 MG tablet Take 1 tablet (20 mg total) by mouth daily. 30 tablet 0  . clonazePAM (KLONOPIN) 1 MG tablet Take 1 mg by mouth at bedtime.     . diphenhydrAMINE (BENADRYL) 50 MG capsule Take 50 mg by mouth 3 (three) times daily as needed for itching or allergies.      Marland Kitchen divalproex (DEPAKOTE) 500 MG DR tablet Take 500 mg by mouth 3 (three) times daily.     Marland Kitchen docusate sodium (COLACE) 50 MG capsule Take 50 mg by mouth at bedtime as needed for mild constipation or moderate constipation.    . ferrous sulfate 325 (65 FE) MG tablet Take 325 mg by mouth daily.    . haloperidol (HALDOL) 5 MG tablet Take 1 tablet (5 mg total) by mouth at bedtime. (Patient taking differently: Take 5 mg by mouth 3 (three) times daily. ) 30 tablet 0  . ibuprofen (ADVIL,MOTRIN) 400 MG tablet Take 400 mg by mouth every 6 (six) hours as needed for mild pain or moderate pain.    Marland Kitchen senna (SENOKOT) 8.6 MG TABS tablet Take 1 tablet by mouth at bedtime as needed for mild constipation or moderate constipation.    Marland Kitchen  traZODone (DESYREL) 50 MG tablet Take 25-50 mg by mouth See admin instructions. 25 mg at 9 am, 25 mg at 3 pm, and 50 mg at bedtime    . valACYclovir (VALTREX) 1000 MG tablet Take 1,000 mg by mouth daily.    . Vitamin D, Ergocalciferol, (DRISDOL) 50000 units CAPS capsule Take 50,000 Units by mouth every Tuesday.       Musculoskeletal: Strength & Muscle Tone: within normal limits Gait & Station: normal Patient leans: N/A  Psychiatric Specialty Exam: Physical Exam  Nursing note and vitals reviewed. Constitutional: She appears well-developed and well-nourished.  HENT:  Head: Normocephalic and atraumatic.  Eyes: Pupils are equal, round, and reactive to light. Conjunctivae are normal.  Neck: Normal range of motion.  Cardiovascular: Regular rhythm and normal heart sounds.  Respiratory: Effort normal. No respiratory distress.  GI: Soft.  Musculoskeletal: Normal range of motion.  Neurological: She is alert.  Skin: Skin is warm and dry.  Psychiatric: Her mood appears anxious. Her speech is delayed. She is slowed. Thought content is paranoid. Cognition and memory are impaired. She expresses impulsivity. She expresses no homicidal and no suicidal ideation.    Review of Systems   Constitutional: Negative.   HENT: Negative.   Eyes: Negative.   Respiratory: Negative.   Cardiovascular: Negative.   Gastrointestinal: Negative.   Musculoskeletal: Negative.   Skin: Negative.   Neurological: Negative.   Psychiatric/Behavioral: Positive for depression and hallucinations. Negative for memory loss, substance abuse and suicidal ideas. The patient is nervous/anxious. The patient does not have insomnia.     Blood pressure 98/67, pulse 77, temperature 99.2 F (37.3 C), temperature source Oral, resp. rate 18, height '5\' 5"'  (1.651 m), weight 72.6 kg, SpO2 100 %.Body mass index is 26.63 kg/m.  General Appearance: Casual  Eye Contact:  Good  Speech:  Clear and Coherent  Volume:  Decreased  Mood:  Anxious and Depressed  Affect:  Tearful  Thought Process:  Disorganized  Orientation:  Full (Time, Place, and Person)  Thought Content:  Illogical and Tangential  Suicidal Thoughts:  No  Homicidal Thoughts:  No  Memory:  Immediate;   Fair Recent;   Fair Remote;   Fair  Judgement:  Impaired  Insight:  Shallow  Psychomotor Activity:  Decreased  Concentration:  Concentration: Fair  Recall:  [employer] of Knowledge:  Fair  Language:  Fair  Akathisia:  No  Handed:  Right  AIMS (if indicated):     Assets:  Desire for Improvement Housing Physical Health Resilience Social Support  ADL's:  Impaired  Cognition:  Impaired,  Mild  Sleep:        Treatment Plan Summary: Plan Patient was schizoaffective disorder and intellectual disability.  Patient is tearful but redirectable.  Not actually wishing to harm herself.  Not threatening violence.  Offered supportive counseling and empathy.  Reviewed medications and labs.  Nothing in her labs do indicate a need for any change in medical treatment.  No change to medicines at this point.  Patient reassured and encouraged to continue working on her coping skills which she shows some pride in.  Case reviewed with emergency room physician.   She can be discharged back to her group home when they are ready to pick her up.  Disposition: No evidence of imminent risk to self or others at present.   Patient does not meet criteria for psychiatric inpatient admission. Supportive therapy provided about ongoing stressors. Discussed crisis plan, support from social network, calling 911, coming to  the Emergency Department, and calling Suicide Hotline.  Alethia Berthold, MD 07/27/2018 10:13 PM

## 2018-07-27 NOTE — ED Notes (Signed)
One animal print shirt removed, one white pair of leggings, one white pair of socks, one brown pair of boots, and one brown halter neck bathing suit top removed and placed in pt belongings bag

## 2019-06-18 ENCOUNTER — Encounter: Payer: Self-pay | Admitting: Obstetrics and Gynecology

## 2019-06-18 ENCOUNTER — Other Ambulatory Visit (HOSPITAL_COMMUNITY)
Admission: RE | Admit: 2019-06-18 | Discharge: 2019-06-18 | Disposition: A | Discharge status: Home / Self Care | Payer: Medicaid Other | Source: Ambulatory Visit | Attending: Obstetrics and Gynecology | Admitting: Obstetrics and Gynecology

## 2019-06-18 ENCOUNTER — Other Ambulatory Visit: Payer: Self-pay

## 2019-06-18 ENCOUNTER — Ambulatory Visit (INDEPENDENT_AMBULATORY_CARE_PROVIDER_SITE_OTHER): Payer: Medicaid Other | Admitting: Obstetrics and Gynecology

## 2019-06-18 VITALS — BP 105/69 | HR 80 | Ht 66.0 in | Wt 170.1 lb

## 2019-06-18 DIAGNOSIS — Z129 Encounter for screening for malignant neoplasm, site unspecified: Secondary | ICD-10-CM

## 2019-06-18 DIAGNOSIS — Z0000 Encounter for general adult medical examination without abnormal findings: Secondary | ICD-10-CM | POA: Diagnosis not present

## 2019-06-18 DIAGNOSIS — Z01419 Encounter for gynecological examination (general) (routine) without abnormal findings: Secondary | ICD-10-CM

## 2019-06-18 MED ORDER — OXYBUTYNIN CHLORIDE 5 MG PO TABS: 5.0000 mg | ORAL_TABLET | Freq: Every day | ORAL | 2 refills | Status: DC

## 2019-06-18 NOTE — Progress Notes (Signed)
Patient comes in today for physical. She is due for PAP. Labs are through PCP.

## 2019-06-18 NOTE — Progress Notes (Signed)
HPI:      [name] is a 38 y.o. G1P1001 who LMP was No LMP recorded. (Menstrual status: IUD).  Subjective:   She presents today for her annual examination.  She is a patient with schizoaffective disorder who currently lives with her stepmother who is her caregiver.  The patient states that she still has regular periods with her Mirena IUD but these are very light.  She reports a vaginal discharge with occasional odor.  She states that she is not currently sexually active.    Hx: The following portions of the patient's history were reviewed and updated as appropriate:             She  has a past medical history of Anxiety, Depression, HSV infection, Mental retardation, and Schizophrenia (HCC). She does not have any pertinent problems on file. She  has a past surgical history that includes Cesarean section. Her family history is not on file. She  reports that she has never smoked. She has never used smokeless tobacco. She reports that she does not drink alcohol or use drugs. She has a current medication list which includes the following prescription(s): acetaminophen, aripiprazole er, cetirizine, citalopram, clonazepam, diphenhydramine, divalproex, docusate sodium, ferrous sulfate, haloperidol, ibuprofen, oxybutynin, senna, trazodone, valacyclovir, and vitamin d (ergocalciferol). She has No Known Allergies.       Review of Systems:  Review of Systems  Constitutional: Denied constitutional symptoms, night sweats, recent illness, fatigue, fever, insomnia and weight loss.  Eyes: Denied eye symptoms, eye pain, photophobia, vision change and visual disturbance.  Ears/Nose/Throat/Neck: Denied ear, nose, throat or neck symptoms, hearing loss, nasal discharge, sinus congestion and sore throat.  Cardiovascular: Denied cardiovascular symptoms, arrhythmia, chest pain/pressure, edema, exercise intolerance, orthopnea and palpitations.  Respiratory: Denied pulmonary symptoms, asthma, pleuritic  pain, productive sputum, cough, dyspnea and wheezing.  Gastrointestinal: Denied, gastro-esophageal reflux, melena, nausea and vomiting.  Genitourinary: See HPI for additional information.  Musculoskeletal: Denied musculoskeletal symptoms, stiffness, swelling, muscle weakness and myalgia.  Dermatologic: Denied dermatology symptoms, rash and scar.  Neurologic: Denied neurology symptoms, dizziness, headache, neck pain and syncope.  Psychiatric: Denied psychiatric symptoms, anxiety and depression.  Endocrine: Denied endocrine symptoms including hot flashes and night sweats.   Meds:   Current Outpatient Medications on File Prior to Visit  Medication Sig Dispense Refill  . acetaminophen (TYLENOL) 325 MG tablet Take 650 mg by mouth every 4 (four) hours as needed for mild pain or moderate pain.    . ARIPiprazole ER (ABILIFY MAINTENA) 400 MG SRER injection Inject 400 mg into the muscle every 30 (thirty) days. On the first of each month    . cetirizine (ZYRTEC) 10 MG tablet Take 10 mg by mouth daily as needed for allergies or rhinitis.    . citalopram (CELEXA) 20 MG tablet Take 1 tablet (20 mg total) by mouth daily. 30 tablet 0  . clonazePAM (KLONOPIN) 1 MG tablet Take 1 mg by mouth at bedtime.     . diphenhydrAMINE (BENADRYL) 50 MG capsule Take 50 mg by mouth 3 (three) times daily as needed for itching or allergies.     Marland Kitchen. divalproex (DEPAKOTE) 500 MG DR tablet Take 500 mg by mouth 3 (three) times daily.     Marland Kitchen. docusate sodium (COLACE) 50 MG capsule Take 50 mg by mouth at bedtime as needed for mild constipation or moderate constipation.    . ferrous sulfate 325 (65 FE) MG tablet Take 325 mg by mouth daily.    . haloperidol (HALDOL) 5  MG tablet Take 1 tablet (5 mg total) by mouth at bedtime. (Patient taking differently: Take 5 mg by mouth 3 (three) times daily. ) 30 tablet 0  . ibuprofen (ADVIL,MOTRIN) 400 MG tablet Take 400 mg by mouth every 6 (six) hours as needed for mild pain or moderate pain.    Marland Kitchen  senna (SENOKOT) 8.6 MG TABS tablet Take 1 tablet by mouth at bedtime as needed for mild constipation or moderate constipation.    . traZODone (DESYREL) 50 MG tablet Take 25-50 mg by mouth See admin instructions. 25 mg at 9 am, 25 mg at 3 pm, and 50 mg at bedtime    . valACYclovir (VALTREX) 1000 MG tablet Take 1,000 mg by mouth daily.    . Vitamin D, Ergocalciferol, (DRISDOL) 50000 units CAPS capsule Take 50,000 Units by mouth every Tuesday.      No current facility-administered medications on file prior to visit.     Objective:     Vitals:   06/18/19 1443  BP: 105/69  Pulse: 80              Physical examination General NAD, Conversant  HEENT Atraumatic; Op clear with mmm.  Normo-cephalic. Pupils reactive. Anicteric sclerae  Thyroid/Neck Smooth without nodularity or enlargement. Normal ROM.  Neck Supple.  Skin No rashes, lesions or ulceration. Normal palpated skin turgor. No nodularity.  Breasts: No masses or discharge.  Symmetric.  No axillary adenopathy.  Lungs: Clear to auscultation.No rales or wheezes. Normal Respiratory effort, no retractions.  Heart: NSR.  No murmurs or rubs appreciated. No periferal edema  Abdomen: Soft.  Non-tender.  No masses.  No HSM. No hernia  Extremities: Moves all appropriately.  Normal ROM for age. No lymphadenopathy.  Neuro: Oriented to PPT.  Normal mood. Normal affect.     Pelvic:   Vulva: Normal appearance.  No lesions.  Vagina: No lesions or abnormalities noted.  Support: Normal pelvic support.  Urethra No masses tenderness or scarring.  Meatus Normal size without lesions or prolapse.  Cervix: Normal appearance.  No lesions.  Anus: Normal exam.  No lesions.  Perineum: Normal exam.  No lesions.        Bimanual   Uterus: Normal size.  Non-tender.  Mobile.  AV.  Adnexae: No masses.  Non-tender to palpation.  Cul-de-sac: Negative for abnormality.    WET PREP: clue cells: absent, KOH (yeast): negative, odor: absent and trichomoniasis:  negative  Ph:  < 4.5   Assessment:    G1P1001 Patient Active Problem List   Diagnosis Date Noted  . IUD check up 09/06/2016  . Pelvic pain 09/06/2016  . Abnormal uterine bleeding (AUB) 09/06/2016  . Suicidal behavior 03/08/2016  . IUD contraception 03/02/2016  . Herpes genitalis 12/28/2015  . Borderline personality disorder ([place]) 09/16/2015  . Intellectual disability 09/16/2015  . Schizoaffective disorder, depressive type ([place]) 09/16/2015  . Mental retardation 05/29/2015     1. Well woman exam with routine gynecological exam   2. Screening for cancer     IUD in place regulating cycles and providing birth control if ever needed.  Wet prep shows no evidence of infection.   Plan:            1.  Basic Screening Recommendations The basic screening recommendations for asymptomatic women were discussed with the patient during her visit.  The age-appropriate recommendations were discussed with her and the rational for the tests reviewed.  When I am informed by the patient that another primary care physician has  previously obtained the age-appropriate tests and they are up-to-date, only outstanding tests are ordered and referrals given as necessary.  Abnormal results of tests will be discussed with her when all of her results are completed. Pap smear performed 2.  Reassured patient regarding vaginal discharge 3.  GC/CT added to Pap. Orders No orders of the defined types were placed in this encounter.    Meds ordered this encounter  Medications  . oxybutynin (DITROPAN) 5 MG tablet    Sig: Take 1 tablet (5 mg total) by mouth daily.    Dispense:  30 tablet    Refill:  2        F/U  No follow-ups on file.  Elonda Huskyavid J. Lura Falor, M.D. 06/18/2019 3:19 PM

## 2019-06-25 LAB — CYTOLOGY - PAP
Chlamydia: NEGATIVE
Diagnosis: NEGATIVE
HPV: NOT DETECTED
Neisseria Gonorrhea: NEGATIVE

## 2019-07-16 ENCOUNTER — Emergency Department
Admission: EM | Admit: 2019-07-16 | Discharge: 2019-07-17 | Disposition: A | Discharge status: Home / Self Care | Payer: Medicaid Other | Attending: Emergency Medicine | Admitting: Emergency Medicine

## 2019-07-16 DIAGNOSIS — F79 Unspecified intellectual disabilities: Secondary | ICD-10-CM

## 2019-07-16 DIAGNOSIS — R451 Restlessness and agitation: Secondary | ICD-10-CM | POA: Diagnosis not present

## 2019-07-16 DIAGNOSIS — F259 Schizoaffective disorder, unspecified: Secondary | ICD-10-CM | POA: Diagnosis not present

## 2019-07-16 DIAGNOSIS — Z79899 Other long term (current) drug therapy: Secondary | ICD-10-CM | POA: Diagnosis not present

## 2019-07-16 DIAGNOSIS — F251 Schizoaffective disorder, depressive type: Secondary | ICD-10-CM | POA: Diagnosis present

## 2019-07-16 DIAGNOSIS — N939 Abnormal uterine and vaginal bleeding, unspecified: Secondary | ICD-10-CM | POA: Diagnosis present

## 2019-07-16 DIAGNOSIS — A6000 Herpesviral infection of urogenital system, unspecified: Secondary | ICD-10-CM | POA: Diagnosis present

## 2019-07-16 DIAGNOSIS — F603 Borderline personality disorder: Secondary | ICD-10-CM | POA: Diagnosis present

## 2019-07-16 DIAGNOSIS — F22 Delusional disorders: Secondary | ICD-10-CM | POA: Diagnosis present

## 2019-07-16 LAB — CBC WITH DIFFERENTIAL/PLATELET
Abs Immature Granulocytes: 0.02 10*3/uL (ref 0.00–0.07)
Basophils Absolute: 0 10*3/uL (ref 0.0–0.1)
Basophils Relative: 0 %
Eosinophils Absolute: 0 10*3/uL (ref 0.0–0.5)
Eosinophils Relative: 1 %
HCT: 30.8 % — ABNORMAL LOW (ref 36.0–46.0)
Hemoglobin: 10.5 g/dL — ABNORMAL LOW (ref 12.0–15.0)
Immature Granulocytes: 0 %
Lymphocytes Relative: 42 %
Lymphs Abs: 2.5 10*3/uL (ref 0.7–4.0)
MCH: 26.6 pg (ref 26.0–34.0)
MCHC: 34.1 g/dL (ref 30.0–36.0)
MCV: 78.2 fL — ABNORMAL LOW (ref 80.0–100.0)
Monocytes Absolute: 0.4 10*3/uL (ref 0.1–1.0)
Monocytes Relative: 7 %
Neutro Abs: 2.9 10*3/uL (ref 1.7–7.7)
Neutrophils Relative %: 50 %
Platelets: 178 10*3/uL (ref 150–400)
RBC: 3.94 MIL/uL (ref 3.87–5.11)
RDW: 15.3 % (ref 11.5–15.5)
WBC: 5.8 10*3/uL (ref 4.0–10.5)
nRBC: 0 % (ref 0.0–0.2)

## 2019-07-16 LAB — COMPREHENSIVE METABOLIC PANEL
ALT: 9 U/L (ref 0–44)
AST: 19 U/L (ref 15–41)
Albumin: 3.9 g/dL (ref 3.5–5.0)
Alkaline Phosphatase: 44 U/L (ref 38–126)
Anion gap: 7 (ref 5–15)
BUN: 9 mg/dL (ref 6–20)
CO2: 24 mmol/L (ref 22–32)
Calcium: 8.8 mg/dL — ABNORMAL LOW (ref 8.9–10.3)
Chloride: 106 mmol/L (ref 98–111)
Creatinine, Ser: 0.62 mg/dL (ref 0.44–1.00)
GFR calc Af Amer: >60 mL/min (ref >60)
GFR calc non Af Amer: >60 mL/min (ref >60)
Glucose, Bld: 114 mg/dL — ABNORMAL HIGH (ref 70–99)
Potassium: 3.5 mmol/L (ref 3.5–5.1)
Sodium: 137 mmol/L (ref 135–145)
Total Bilirubin: 0.3 mg/dL (ref 0.3–1.2)
Total Protein: 6.8 g/dL (ref 6.5–8.1)

## 2019-07-16 LAB — SALICYLATE LEVEL: Salicylate Lvl: <7 mg/dL (ref 2.8–30.0)

## 2019-07-16 LAB — ETHANOL: Alcohol, Ethyl (B): <10 mg/dL (ref <10)

## 2019-07-16 LAB — VALPROIC ACID LEVEL: Valproic Acid Lvl: 41 ug/mL — ABNORMAL LOW (ref 50.0–100.0)

## 2019-07-16 LAB — ACETAMINOPHEN LEVEL: Acetaminophen (Tylenol), Serum: <10 ug/mL — ABNORMAL LOW (ref 10–30)

## 2019-07-16 MED ORDER — PRAZOSIN HCL 1 MG PO CAPS
2.0000 mg | ORAL_CAPSULE | Freq: Every day | ORAL | Status: DC
  Administered 2019-07-16: 2 mg via ORAL
  Filled 2019-07-16: qty 2

## 2019-07-16 MED ORDER — VALACYCLOVIR HCL 500 MG PO TABS
1000.0000 mg | ORAL_TABLET | Freq: Every day | ORAL | Status: DC
  Administered 2019-07-16 – 2019-07-17 (×2): 1000 mg via ORAL
  Filled 2019-07-16 (×2): qty 2

## 2019-07-16 MED ORDER — DIVALPROEX SODIUM 500 MG PO DR TAB
500.0000 mg | DELAYED_RELEASE_TABLET | Freq: Three times a day (TID) | ORAL | Status: DC
  Administered 2019-07-16 – 2019-07-17 (×2): 500 mg via ORAL
  Filled 2019-07-16 (×2): qty 1

## 2019-07-16 MED ORDER — OLANZAPINE 5 MG PO TBDP
5.0000 mg | ORAL_TABLET | Freq: Every day | ORAL | Status: DC
  Administered 2019-07-16 – 2019-07-17 (×2): 5 mg via ORAL
  Filled 2019-07-16 (×2): qty 1

## 2019-07-16 MED ORDER — VITAMIN D (ERGOCALCIFEROL) 1.25 MG (50000 UNIT) PO CAPS
50000.0000 [IU] | ORAL_CAPSULE | ORAL | Status: DC
  Administered 2019-07-16: 50000 [IU] via ORAL
  Filled 2019-07-16: qty 1

## 2019-07-16 MED ORDER — VENLAFAXINE HCL ER 75 MG PO CP24
225.0000 mg | ORAL_CAPSULE | Freq: Every day | ORAL | Status: DC
  Administered 2019-07-16 – 2019-07-17 (×2): 225 mg via ORAL
  Filled 2019-07-16 (×2): qty 1

## 2019-07-16 MED ORDER — OXYBUTYNIN CHLORIDE 5 MG PO TABS
5.0000 mg | ORAL_TABLET | Freq: Every day | ORAL | Status: DC
  Administered 2019-07-16 – 2019-07-17 (×2): 5 mg via ORAL
  Filled 2019-07-16 (×2): qty 1
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
## 2019-07-16 NOTE — ED Notes (Signed)
Snack and beverage given. 

## 2019-07-16 NOTE — ED Notes (Signed)
Hourly rounding reveals patient in room. No complaints, stable, in no acute distress. Q15 minute rounds and monitoring via Rover and Officer to continue.   

## 2019-07-16 NOTE — ED Triage Notes (Signed)
Patient came in voluntarily via Film/video editor. Patient has hx of schizophrenia and needs medication adjustment.

## 2019-07-16 NOTE — ED Provider Notes (Signed)
[facility] Emergency Department Provider Note   ____________________________________________   First MD Initiated Contact with Patient 07/16/19 1935     (approximate)  I have reviewed the triage vital signs and the nursing notes.   HISTORY  Chief Complaint Paranoid (medication adjustment )    HPI Krista [name] is a 38 y.o. female patient comes in she is upset and irritated and says she needs her meds adjusted.  She says are not working right.  She says she does not want to be in the hospital and does not want to stay in the hospital and that her hair is falling out.  Nurse was able to get history of paranoia from the patient but she cannot give this to me.  Her past medical history includes mental retardation borderline personality disorder schizoaffective disorder depressive type and suicidal ideation in the past.  She does not appear to have is currently.         Past Medical History:  Diagnosis Date   Anxiety    Depression    HSV infection    Mental retardation    Schizophrenia [facility](HCC)     Patient Active Problem List   Diagnosis Date Noted   IUD check up 09/06/2016   Pelvic pain 09/06/2016   Abnormal uterine bleeding (AUB) 09/06/2016   Suicidal behavior 03/08/2016   IUD contraception 03/02/2016   Herpes genitalis 12/28/2015   Borderline personality disorder (HCC) 09/16/2015   Intellectual disability 09/16/2015   Schizoaffective disorder, depressive type (HCC) 09/16/2015   Mental retardation 05/29/2015    Past Surgical History:  Procedure Laterality Date   CESAREAN SECTION      Prior to Admission medications   Medication Sig Start Date End Date Taking? Authorizing Provider  OLANZapine zydis (ZYPREXA) 5 MG disintegrating tablet TAKE 1 TABLET BY MOUTH DAILY, MAY NOT EXCEED 3 TABS DAILY FOR OUTBURTS 07/03/19  Yes [provider]  acetaminophen (TYLENOL) 325 MG tablet Take 650 mg by mouth every 4 (four) hours  as needed for mild pain or moderate pain.    [provider]  ARIPiprazole ER (ABILIFY MAINTENA) 400 MG SRER injection Inject 400 mg into the muscle every 30 (thirty) days. On the first of each month    [provider]  cetirizine (ZYRTEC) 10 MG tablet Take 10 mg by mouth daily as needed for allergies or rhinitis.    [provider]  citalopram (CELEXA) 20 MG tablet Take 1 tablet (20 mg total) by mouth daily. 03/14/16   Pucilowska, Jolanta B, MD  clonazePAM (KLONOPIN) 1 MG tablet Take 1 mg by mouth at bedtime.     [provider]  diphenhydrAMINE (BENADRYL) 50 MG capsule Take 50 mg by mouth 3 (three) times daily as needed for itching or allergies.     [provider]  divalproex (DEPAKOTE) 500 MG DR tablet Take 500 mg by mouth 3 (three) times daily.     [provider]  docusate sodium (COLACE) 50 MG capsule Take 50 mg by mouth at bedtime as needed for mild constipation or moderate constipation.    [provider]  ferrous sulfate 325 (65 FE) MG tablet Take 325 mg by mouth daily.    [provider]  haloperidol (HALDOL) 5 MG tablet Take 1 tablet (5 mg total) by mouth at bedtime. Patient taking differently: Take 5 mg by mouth 3 (three) times daily.  03/14/16   Pucilowska, Jolanta B, MD  ibuprofen (ADVIL,MOTRIN) 400 MG tablet Take 400 mg by  mouth every 6 (six) hours as needed for mild pain or moderate pain.    [provider]  oxybutynin (DITROPAN) 5 MG tablet Take 1 tablet (5 mg total) by mouth daily. 06/18/19   Linzie CollinEvans, David James, MD  senna (SENOKOT) 8.6 MG TABS tablet Take 1 tablet by mouth at bedtime as needed for mild constipation or moderate constipation.    [provider]  traZODone (DESYREL) 50 MG tablet Take 25-50 mg by mouth See admin instructions. 25 mg at 9 am, 25 mg at 3 pm, and 50 mg at bedtime    [provider]  valACYclovir (VALTREX) 1000 MG tablet Take 1,000 mg by mouth daily.    [provider]  venlafaxine XR (EFFEXOR-XR) 75 MG 24 hr capsule Take 225 mg by mouth daily. 06/26/19   [provider]  Vitamin D, Ergocalciferol, (DRISDOL) 50000 units CAPS capsule Take 50,000 Units by mouth every Tuesday.     [provider]    Allergies Patient has no known allergies.  Family History  Problem Relation Age of Onset   Cancer Neg Hx     Social History Social History   Tobacco Use   Smoking status: Never Smoker   Smokeless tobacco: Never Used  Substance Use Topics   Alcohol use: No   Drug use: No    Review of Systems  Constitutional: No fever/chills Eyes: No visual changes. ENT: No sore throat. Cardiovascular: Denies chest pain. Respiratory: Denies shortness of breath. Gastrointestinal: No abdominal pain.  No nausea, no vomiting.  No diarrhea.  No constipation. Genitourinary: Negative for dysuria. Musculoskeletal: Negative for back pain. Skin: Negative for rash. Neurological: Negative for headaches, focal weakness   ____________________________________________   PHYSICAL EXAM:  VITAL SIGNS: ED Triage Vitals [07/16/19 1937]  Enc Vitals Group     BP      Pulse      Resp      Temp      Temp src      SpO2      Weight      Height      Head Circumference      Peak Flow      Pain Score 0     Pain Loc      Pain Edu?      Excl. in GC?     Constitutional: Alert and oriented.  Upset and somewhat tearful Eyes: Conjunctivae are normal. Head: Atraumatic. Nose: No congestion/rhinnorhea. Mouth/Throat: Mucous membranes are moist.  Oropharynx non-erythematous. Neck: No stridor.  Cardiovascular: Normal rate, regular rhythm. Grossly normal heart sounds.  Good peripheral circulation. Respiratory: Normal respiratory effort.  No retractions. Lungs CTAB. Gastrointestinal: Soft and nontender. No distention. No abdominal bruits. No CVA tenderness. Musculoskeletal: No lower extremity tenderness nor edema.   Neurologic:  Normal speech  and language. No gross focal neurologic deficits are appreciated. No gait instability. Skin:  Skin is warm, dry and intact. No rash noted.   ____________________________________________   LABS (all labs ordered are listed, but only abnormal results are displayed)  Labs Reviewed  COMPREHENSIVE METABOLIC PANEL - Abnormal; Notable for the following components:      Result Value   Glucose, Bld 114 (*)    Calcium 8.8 (*)    All other components within normal limits  CBC WITH DIFFERENTIAL/PLATELET - Abnormal; Notable for the following components:   Hemoglobin 10.5 (*)    HCT 30.8 (*)    MCV 78.2 (*)    All other components within normal limits  ACETAMINOPHEN LEVEL  ETHANOL  SALICYLATE LEVEL  URINALYSIS, COMPLETE (UACMP) WITH MICROSCOPIC  PREGNANCY, URINE  URINE DRUG SCREEN, QUALITATIVE ([facility] ONLY)  VALPROIC ACID LEVEL   ____________________________________________  EKG  ____________________________________________  RADIOLOGY  ED MD interpretation:  Official radiology report(s): No results found.  ____________________________________________   PROCEDURES  Procedure(s) performed (including Critical Care):  Procedures   ____________________________________________   INITIAL IMPRESSION / ASSESSMENT AND PLAN / ED COURSE  Psych talks to the patient.  Currently has something happened to group home and she is still very upset about it we will plan on watching her overnight.  See how she is doing in the morning and possibly let her go then.             ____________________________________________   FINAL CLINICAL IMPRESSION(S) / ED DIAGNOSES  Final diagnoses:  Agitation     ED Discharge Orders    None       Note:  This document was prepared using Dragon voice recognition software and may include unintentional dictation errors.    Nena Polio, MD 07/16/19 [id number]

## 2019-07-17 DIAGNOSIS — R451 Restlessness and agitation: Secondary | ICD-10-CM

## 2019-07-17 NOTE — ED Notes (Signed)
ED  Is the patient under IVC or is there intent for IVC:  no   Is the patient medically cleared: Yes.   Is there vacancy in the ED BHU: Yes.   Is the population mix appropriate for patient: Yes.   Is the patient awaiting placement in inpatient or outpatient setting:  Discharge home    Has the patient had a psychiatric consult: Yes.   Survey of unit performed for contraband, proper placement and condition of furniture, tampering with fixtures in bathroom, shower, and each patient room: Yes.  ; Findings:  APPEARANCE/BEHAVIOR Calm and cooperative NEURO ASSESSMENT Orientation: oriented x3  Denies pain Hallucinations: No.None noted (Hallucinations)  Denies  Speech: Normal Gait: normal RESPIRATORY ASSESSMENT Even  Unlabored respirations  CARDIOVASCULAR ASSESSMENT Pulses equal   regular rate  Skin warm and dry   GASTROINTESTINAL ASSESSMENT no GI complaint EXTREMITIES Full ROM  PLAN OF CARE Provide calm/safe environment. Vital signs assessed twice daily. ED BHU Assessment once each 12-hour shift.  Assure the ED provider has rounded once each shift. Provide and encourage hygiene. Provide redirection as needed. Assess for escalating behavior; address immediately and inform ED provider.  Assess family dynamic and appropriateness for visitation as needed: Yes.  ; If necessary, describe findings:  Educate the patient/family about BHU procedures/visitation: Yes.  ; If necessary, describe findings:

## 2019-07-17 NOTE — ED Notes (Signed)
BEHAVIORAL HEALTH ROUNDING Patient sleeping: Yes.   Patient alert and oriented: eyes closed  Appears asleep Behavior appropriate: Yes.  ; If no, describe:  Nutrition and fluids offered: Yes  Toileting and hygiene offered: sleeping Sitter present: q 15 minute observations and security monitoring Law enforcement present: yes  ODS 

## 2019-07-17 NOTE — ED Notes (Signed)
Vol.. 

## 2019-07-17 NOTE — ED Notes (Signed)
Pt asleep, breakfast tray placed in rm.  

## 2019-07-17 NOTE — ED Notes (Signed)
Hourly rounding reveals patient in room. No complaints, stable, in no acute distress. Q15 minute rounds and monitoring via Rover and Officer to continue.   

## 2019-07-17 NOTE — Consult Note (Signed)
Psych ED Discharge  Reason for Consult: Paranoid, medication adjustment Referring Physician: Dr. Juliette AlcideMelinda Patient Identification: Krista HeadingGwendolyn Salomon MRN:  [id number] Principal Diagnosis: <principal problem not specified> Diagnosis:  Active Problems:   Herpes genitalis   Borderline personality disorder (HCC)   Intellectual disability   Schizoaffective disorder, depressive type (HCC)   Abnormal uterine bleeding (AUB)   Total Time spent with patient: 20 minutes  Subjective: "I am here because I acted out and my caregiver called the police on me." Krista [name] is a 38 y.o. female patient [facility] via law enforcement voluntary. I am here today because I broke the door, punch the wall and I thew things all over the house. " my roommate made me mad."  I know that we can work things out when she gets me upset. I am ready to return home at this time. They brought me here so I can cool off.   On evaluation the patient is alert and oriented x3, calm and cooperative. She was very appropriate with Clinical research associatewriter, and acknowledged me upon entering the room. Patient does not appear to be responding to internal stimuli, and denies any delusions, paranoia, hallucinations at this time. She continues to deny suicidal ideation, homicidal ideations and or thoughts to self ham.   Contacted Ms. Adele BarthelSheila Bynum [phone] at the [facility], no answer.   Plan: The patient is not a safety risk to self or others and does not require psychiatric inpatient admission for stabilization and treatment.    Past Psychiatric History:  Anxiety Depression Mental retardation Schizophrenia (HCC)  Risk to Self: Suicidal Ideation: No Suicidal Intent: No Is patient at risk for suicide?: No Suicidal Plan?: No Access to Means: No What has been your use of drugs/alcohol within the last 12 months?: Denied Other Self Harm Risks: denied self harm Triggers for Past Attempts: Unknown Intentional  Self Injurious Behavior: None Risk to Others: Homicidal Ideation: No Thoughts of Harm to Others: No Current Homicidal Intent: No Current Homicidal Plan: No Access to Homicidal Means: No Identified Victim: None identified History of harm to others?: No Assessment of Violence: None Noted Does patient have access to weapons?: No Criminal Charges Pending?: No Does patient have a court date: No Prior Inpatient Therapy: Prior Inpatient Therapy: Yes Prior Therapy Dates: July 2020 Prior [facility] Reason for Treatment: Depression, Suicidal thoughts Prior Outpatient Therapy: Prior Outpatient Therapy: Yes Prior Therapy Dates: Current Prior Therapy Facilty/Provider([facility]): [facility] Reason for Treatment: Depression Does patient have an ACCT team?: No Does patient have Intensive In-House Services?  : No Does patient have [facility] services? : No Does patient have P4CC services?: No  Past Medical History:  Past Medical History:  Diagnosis Date  . Anxiety   . Depression   . HSV infection   . Mental retardation   . Schizophrenia [facility](HCC)     Past Surgical History:  Procedure Laterality Date  . CESAREAN SECTION     Family History:  Family History  Problem Relation Age of Onset  . Cancer Neg Hx    Family Psychiatric  History: Denies any Social History:  Social History   Substance and Sexual Activity  Alcohol Use No     Social History   Substance and Sexual Activity  Drug Use No    Social History   Socioeconomic History  . Marital status: Significant Other    Spouse name: Not on file  . Number of children: Not on file  . Years  of education: Not on file  . Highest education level: Not on file  Occupational History  . Not on file  Social Needs  . Financial resource strain: Not on file  . Food insecurity    Worry: Not on file    Inability: Not on file  . Transportation needs    Medical: Not on file    Non-medical: Not on file  Tobacco  Use  . Smoking status: Never Smoker  . Smokeless tobacco: Never Used  Substance and Sexual Activity  . Alcohol use: No  . Drug use: No  . Sexual activity: Yes    Birth control/protection: I.U.D., Condom  Lifestyle  . Physical activity    Days per week: Not on file    Minutes per session: Not on file  . Stress: Not on file  Relationships  . Social Herbalist on phone: Not on file    Gets together: Not on file    Attends religious service: Not on file    Active member of club or organization: Not on file    Attends meetings of clubs or organizations: Not on file    Relationship status: Not on file  Other Topics Concern  . Not on file  Social History Narrative  . Not on file   Additional Social History:    Allergies:  No Known Allergies  Labs:  Results for orders placed or performed during the hospital encounter of 07/16/19 (from the past 48 hour(s))  Comprehensive metabolic panel     Status: Abnormal   Collection Time: 07/16/19  8:01 PM  Result Value Ref Range   Sodium 137 135 - 145 mmol/L   Potassium 3.5 3.5 - 5.1 mmol/L   Chloride 106 98 - 111 mmol/L   CO2 24 22 - 32 mmol/L   Glucose, Bld 114 (H) 70 - 99 mg/dL   BUN 9 6 - 20 mg/dL   Creatinine, Ser 0.62 0.44 - 1.00 mg/dL   Calcium 8.8 (L) 8.9 - 10.3 mg/dL   Total Protein 6.8 6.5 - 8.1 g/dL   Albumin 3.9 3.5 - 5.0 g/dL   AST 19 15 - 41 U/L   ALT 9 0 - 44 U/L   Alkaline Phosphatase 44 38 - 126 U/L   Total Bilirubin 0.3 0.3 - 1.2 mg/dL   GFR calc non Af Amer >60 >60 mL/min   GFR calc Af Amer >60 >60 mL/min   Anion gap 7 5 - 15    Comment: Performed at [facility], [address] [place], [place] [address]  Acetaminophen level     Status: Abnormal   Collection Time: 07/16/19  8:01 PM  Result Value Ref Range   Acetaminophen (Tylenol), Serum <10 (L) 10 - 30 ug/mL    Comment: (NOTE) Therapeutic concentrations vary significantly. A range of 10-30 ug/mL  may be an effective concentration for  many patients. However, some  are best treated at concentrations outside of this range. Acetaminophen concentrations >150 ug/mL at 4 hours after ingestion  and >50 ug/mL at 12 hours after ingestion are often associated with  toxic reactions. Performed at [facility], [address] [place], [place] [address]   Ethanol     Status: None   Collection Time: 07/16/19  8:01 PM  Result Value Ref Range   Alcohol, Ethyl (B) <10 <10 mg/dL    Comment: (NOTE) Lowest detectable limit for serum alcohol is 10 mg/dL. For medical purposes only. Performed at [facility],  [address] [place], [place] [address]   Salicylate level     Status: None   Collection Time: 07/16/19  8:01 PM  Result Value Ref Range   Salicylate Lvl <7.0 2.8 - 30.0 mg/dL    Comment: Performed at [facility], [address] [place], [place] [address]  CBC with Differential     Status: Abnormal   Collection Time: 07/16/19  8:01 PM  Result Value Ref Range   WBC 5.8 4.0 - 10.5 K/uL   RBC 3.94 3.87 - 5.11 MIL/uL   Hemoglobin 10.5 (L) 12.0 - 15.0 g/dL   HCT 09.830.8 (L) 11.936.0 - 14.746.0 %   MCV 78.2 (L) 80.0 - 100.0 fL   MCH 26.6 26.0 - 34.0 pg   MCHC 34.1 30.0 - 36.0 g/dL   RDW 82.915.3 56.211.5 - 13.015.5 %   Platelets 178 150 - 400 K/uL   nRBC 0.0 0.0 - 0.2 %   Neutrophils Relative % 50 %   Neutro Abs 2.9 1.7 - 7.7 K/uL   Lymphocytes Relative 42 %   Lymphs Abs 2.5 0.7 - 4.0 K/uL   Monocytes Relative 7 %   Monocytes Absolute 0.4 0.1 - 1.0 K/uL   Eosinophils Relative 1 %   Eosinophils Absolute 0.0 0.0 - 0.5 K/uL   Basophils Relative 0 %   Basophils Absolute 0.0 0.0 - 0.1 K/uL   Immature Granulocytes 0 %   Abs Immature Granulocytes 0.02 0.00 - 0.07 K/uL    Comment: Performed at [facility], [address] [place], [place] [address]  Valproic acid level     Status: Abnormal   Collection Time: 07/16/19  8:01 PM  Result Value Ref Range   Valproic Acid Lvl 41 (L) 50.0 - 100.0 ug/mL     Comment: Performed at [facility], [address] [place], [place] [address]    Current Facility-Administered Medications  Medication Dose Route Frequency Provider Last Rate Last Dose  . divalproex (DEPAKOTE) DR tablet 500 mg  500 mg Oral TID Gillermo Murdochhompson, Jacqueline, NP   500 mg at 07/16/19 2139  . OLANZapine zydis (ZYPREXA) disintegrating tablet 5 mg  5 mg Oral Daily Gillermo Murdochhompson, Jacqueline, NP   5 mg at 07/16/19 2139  . oxybutynin (DITROPAN) tablet 5 mg  5 mg Oral Daily Gillermo Murdochhompson, Jacqueline, NP   5 mg at 07/16/19 2139  . prazosin (MINIPRESS) capsule 2 mg  2 mg Oral QHS Gillermo Murdochhompson, Jacqueline, NP   2 mg at 07/16/19 2139  . valACYclovir (VALTREX) tablet 1,000 mg  1,000 mg Oral Daily Gillermo Murdochhompson, Jacqueline, NP   1,000 mg at 07/16/19 2139  . venlafaxine XR (EFFEXOR-XR) 24 hr capsule 225 mg  225 mg Oral Daily Gillermo Murdochhompson, Jacqueline, NP   225 mg at 07/16/19 2138  . Vitamin D (Ergocalciferol) (DRISDOL) capsule 50,000 Units  50,000 Units Oral Q Devin Goingue Thompson, Jacqueline, NP   50,000 Units at 07/16/19 2139   Current Outpatient Medications  Medication Sig Dispense Refill  . ARIPiprazole (ABILIFY) 20 MG tablet Take 20 mg by mouth daily.    . divalproex (DEPAKOTE) 500 MG DR tablet Take 500 mg by mouth 3 (three) times daily.     Marland Kitchen. OLANZapine zydis (ZYPREXA) 5 MG disintegrating tablet TAKE 1 TABLET BY MOUTH DAILY, MAY NOT EXCEED 3 TABS DAILY FOR OUTBURTS    . oxybutynin (DITROPAN) 5 MG tablet Take 1 tablet (5 mg total) by mouth daily. 30 tablet 2  . prazosin (MINIPRESS) 2 MG capsule Take 2 mg by mouth  at bedtime.    . valACYclovir (VALTREX) 1000 MG tablet Take 1,000 mg by mouth daily.    Marland Kitchen. venlafaxine XR (EFFEXOR-XR) 75 MG 24 hr capsule Take 225 mg by mouth daily.    . Vitamin D, Ergocalciferol, (DRISDOL) 50000 units CAPS capsule Take 50,000 Units by mouth every Tuesday.       Musculoskeletal: Strength & Muscle Tone: within normal limits Gait & Station: normal Patient leans: N/A  Psychiatric  Specialty Exam: Physical Exam  Nursing note and vitals reviewed. Constitutional: She is oriented to person, place, and time. She appears well-developed and well-nourished.  HENT:  Head: Normocephalic.  Eyes: Pupils are equal, round, and reactive to light.  Neck: Normal range of motion. Neck supple.  Cardiovascular: Normal rate.  Respiratory: Effort normal.  Musculoskeletal: Normal range of motion.  Neurological: She is alert and oriented to person, place, and time.  Skin: Skin is warm and dry.    Review of Systems  Psychiatric/Behavioral: Positive for depression. The patient is nervous/anxious and has insomnia.   All other systems reviewed and are negative.   Blood pressure 110/75, pulse 77, temperature 98.6 F (37 C), temperature source Oral, resp. rate 16, SpO2 100 %.There is no height or weight on file to calculate BMI.  General Appearance: Disheveled wearing purple scrubs  Eye Contact:  Good  Speech:  Normal Rate  Volume:  Normal  Mood:  Euthymic  Affect:  Appropriate  Thought Process:  Linear and Descriptions of Associations: Intact  Orientation:  Other:  alert and oriented x 3  Thought Content:  WDL  Suicidal Thoughts:  Denies  Homicidal Thoughts:  No  Memory:  Immediate;   Good Recent;   Good Remote;   Good  Judgement:  Fair  Insight:  Fair  Psychomotor Activity:  Normal  Concentration:  Concentration: Good and Attention Span: Good  Recall:  FiservFair  Fund of Knowledge:  Fair  Language:  Fair  Akathisia:  No  Handed:  Right  AIMS (if indicated):     Assets:  Communication Skills Desire for Improvement Housing Leisure Time Resilience Social Support  ADL's:  Intact  Cognition:  WNL  Sleep:   Insomnia     Treatment Plan Summary: Plan Discharge home. Patient has been  psychiatrically cleared at this time and is ready for discharge.   Disposition: No evidence of imminent risk to self or others at present.   Patient does not meet criteria for psychiatric  inpatient admission. Supportive therapy provided about ongoing stressors.  Maryagnes Amosakia S Starkes-Perry, FNP 07/17/2019 11:28 AM

## 2019-07-17 NOTE — ED Notes (Signed)

## 2019-07-17 NOTE — ED Notes (Signed)
BEHAVIORAL HEALTH ROUNDING Patient sleeping: No. Patient alert and oriented: yes Behavior appropriate: Yes.  ; If no, describe:  Nutrition and fluids offered: yes Toileting and hygiene offered: Yes  Sitter present: q15 minute observations and security  monitoring Law enforcement present: Yes  ODS  

## 2019-07-17 NOTE — ED Notes (Signed)
Patient observed lying in bed with eyes closed  Even, unlabored respirations observed   NAD pt appears to be sleeping  I will continue to monitor along with every 15 minute visual observations and ongoing security monitoring    

## 2019-07-17 NOTE — ED Provider Notes (Signed)
-----------------------------------------   6:05 AM on 07/17/2019 -----------------------------------------   Blood pressure 110/75, pulse 77, temperature 98.6 F (37 C), temperature source Oral, resp. rate 16, SpO2 100 %.  The patient is calm and cooperative at this time.  There have been no acute events since the last update.  Awaiting disposition plan from Behavioral Medicine and/or Social Work team(s).   Paulette Blanch, MD 07/17/19 [id number]

## 2019-07-19 DIAGNOSIS — R451 Restlessness and agitation: Secondary | ICD-10-CM | POA: Insufficient documentation

## 2019-09-30 ENCOUNTER — Other Ambulatory Visit: Payer: Self-pay

## 2019-09-30 DIAGNOSIS — R4689 Other symptoms and signs involving appearance and behavior: Secondary | ICD-10-CM | POA: Diagnosis present

## 2019-09-30 DIAGNOSIS — R45851 Suicidal ideations: Secondary | ICD-10-CM | POA: Diagnosis not present

## 2019-09-30 DIAGNOSIS — Z79899 Other long term (current) drug therapy: Secondary | ICD-10-CM | POA: Insufficient documentation

## 2019-09-30 DIAGNOSIS — F251 Schizoaffective disorder, depressive type: Secondary | ICD-10-CM | POA: Insufficient documentation

## 2019-09-30 DIAGNOSIS — Z20828 Contact with and (suspected) exposure to other viral communicable diseases: Secondary | ICD-10-CM | POA: Diagnosis not present

## 2019-09-30 DIAGNOSIS — F79 Unspecified intellectual disabilities: Secondary | ICD-10-CM | POA: Insufficient documentation

## 2019-09-30 LAB — COMPREHENSIVE METABOLIC PANEL
ALT: 9 U/L (ref 0–44)
AST: 19 U/L (ref 15–41)
Albumin: 4.1 g/dL (ref 3.5–5.0)
Alkaline Phosphatase: 39 U/L (ref 38–126)
Anion gap: 9 (ref 5–15)
BUN: 14 mg/dL (ref 6–20)
CO2: 25 mmol/L (ref 22–32)
Calcium: 9.3 mg/dL (ref 8.9–10.3)
Chloride: 103 mmol/L (ref 98–111)
Creatinine, Ser: 0.55 mg/dL (ref 0.44–1.00)
GFR calc Af Amer: >60 mL/min (ref >60)
GFR calc non Af Amer: >60 mL/min (ref >60)
Glucose, Bld: 86 mg/dL (ref 70–99)
Potassium: 4.1 mmol/L (ref 3.5–5.1)
Sodium: 137 mmol/L (ref 135–145)
Total Bilirubin: 0.6 mg/dL (ref 0.3–1.2)
Total Protein: 7.5 g/dL (ref 6.5–8.1)

## 2019-09-30 LAB — CBC
HCT: 32.9 % — ABNORMAL LOW (ref 36.0–46.0)
Hemoglobin: 10.9 g/dL — ABNORMAL LOW (ref 12.0–15.0)
MCH: 25.8 pg — ABNORMAL LOW (ref 26.0–34.0)
MCHC: 33.1 g/dL (ref 30.0–36.0)
MCV: 77.8 fL — ABNORMAL LOW (ref 80.0–100.0)
Platelets: 176 10*3/uL (ref 150–400)
RBC: 4.23 MIL/uL (ref 3.87–5.11)
RDW: 14.6 % (ref 11.5–15.5)
WBC: 7.2 10*3/uL (ref 4.0–10.5)
nRBC: 0 % (ref 0.0–0.2)

## 2019-09-30 LAB — ETHANOL: Alcohol, Ethyl (B): <10 mg/dL (ref <10)

## 2019-09-30 LAB — SALICYLATE LEVEL: Salicylate Lvl: <7 mg/dL (ref 2.8–30.0)

## 2019-09-30 LAB — ACETAMINOPHEN LEVEL: Acetaminophen (Tylenol), Serum: <10 ug/mL — ABNORMAL LOW (ref 10–30)

## 2019-09-30 NOTE — ED Notes (Signed)
Pt dressed out in hospital attire. Pt's belongings to include: 1 wig 1 pink shirt 1 black shirt 1 black tanktop 1 pair gray socks 1 black pants 1 black skirt 1 gray bra 1 stripe underwear 1 pair of earrings

## 2019-09-30 NOTE — ED Triage Notes (Addendum)
Pt comes via [employer] with IVC paperwork in hand. Pt states earlier she was having SI thoughts. Pt denies any current SI thoughts. Pt resides at a [facility] in [place]. Pt states she got upset with another person and made threats to hurt herself.  Per [employer] pt was taken to [facility] and then they were called later to transport pt here.  Pt is calm and cooperative. Pt denies any drug or alcohol abuse.

## 2019-10-01 ENCOUNTER — Emergency Department
Admission: EM | Admit: 2019-10-01 | Discharge: 2019-10-02 | Disposition: A | Discharge status: Other Acute Inpatient Hospital | Payer: Medicaid Other | Attending: Emergency Medicine | Admitting: Emergency Medicine

## 2019-10-01 DIAGNOSIS — F251 Schizoaffective disorder, depressive type: Secondary | ICD-10-CM | POA: Diagnosis present

## 2019-10-01 DIAGNOSIS — F603 Borderline personality disorder: Secondary | ICD-10-CM | POA: Diagnosis present

## 2019-10-01 DIAGNOSIS — F79 Unspecified intellectual disabilities: Secondary | ICD-10-CM

## 2019-10-01 DIAGNOSIS — A6000 Herpesviral infection of urogenital system, unspecified: Secondary | ICD-10-CM | POA: Diagnosis present

## 2019-10-01 DIAGNOSIS — Z30431 Encounter for routine checking of intrauterine contraceptive device: Secondary | ICD-10-CM

## 2019-10-01 DIAGNOSIS — R451 Restlessness and agitation: Secondary | ICD-10-CM | POA: Diagnosis present

## 2019-10-01 DIAGNOSIS — R4589 Other symptoms and signs involving emotional state: Secondary | ICD-10-CM | POA: Diagnosis present

## 2019-10-01 DIAGNOSIS — N939 Abnormal uterine and vaginal bleeding, unspecified: Secondary | ICD-10-CM | POA: Diagnosis present

## 2019-10-01 DIAGNOSIS — R102 Pelvic and perineal pain unspecified side: Secondary | ICD-10-CM | POA: Diagnosis present

## 2019-10-01 DIAGNOSIS — IMO0002 Reserved for concepts with insufficient information to code with codable children: Secondary | ICD-10-CM

## 2019-10-01 DIAGNOSIS — R45851 Suicidal ideations: Secondary | ICD-10-CM

## 2019-10-01 DIAGNOSIS — Z915 Personal history of self-harm: Secondary | ICD-10-CM

## 2019-10-01 DIAGNOSIS — Z975 Presence of (intrauterine) contraceptive device: Secondary | ICD-10-CM

## 2019-10-01 LAB — URINE DRUG SCREEN, QUALITATIVE (ARMC ONLY)
Amphetamines, Ur Screen: NOT DETECTED
Barbiturates, Ur Screen: NOT DETECTED
Benzodiazepine, Ur Scrn: NOT DETECTED
Cannabinoid 50 Ng, Ur ~~LOC~~: NOT DETECTED
Cocaine Metabolite,Ur ~~LOC~~: NOT DETECTED
MDMA (Ecstasy)Ur Screen: NOT DETECTED
Methadone Scn, Ur: NOT DETECTED
Opiate, Ur Screen: NOT DETECTED
Phencyclidine (PCP) Ur S: NOT DETECTED
Tricyclic, Ur Screen: NOT DETECTED

## 2019-10-01 LAB — SARS CORONAVIRUS 2 BY RT PCR (HOSPITAL ORDER, PERFORMED IN ~~LOC~~ HOSPITAL LAB): SARS Coronavirus 2: NEGATIVE

## 2019-10-01 MED ORDER — DIVALPROEX SODIUM 500 MG PO DR TAB
500.0000 mg | DELAYED_RELEASE_TABLET | Freq: Three times a day (TID) | ORAL | Status: DC
  Administered 2019-10-01: 500 mg via ORAL
  Filled 2019-10-01: qty 1

## 2019-10-01 MED ORDER — OXYBUTYNIN CHLORIDE 5 MG PO TABS
5.0000 mg | ORAL_TABLET | Freq: Every day | ORAL | Status: DC
  Administered 2019-10-01: 19:00:00 5 mg via ORAL
  Filled 2019-10-01 (×2): qty 1

## 2019-10-01 MED ORDER — PRAZOSIN HCL 2 MG PO CAPS
2.0000 mg | ORAL_CAPSULE | Freq: Every day | ORAL | Status: DC
  Administered 2019-10-01: 22:00:00 2 mg via ORAL
  Filled 2019-10-01: qty 1

## 2019-10-01 MED ORDER — VALACYCLOVIR HCL 500 MG PO TABS
1000.0000 mg | ORAL_TABLET | Freq: Every day | ORAL | Status: DC
  Administered 2019-10-01: 19:00:00 1000 mg via ORAL
  Filled 2019-10-01 (×2): qty 2

## 2019-10-01 NOTE — ED Notes (Signed)
Pt. Alert and oriented, warm and dry, in no distress. Pt. Denies SI, HI, and AVH. Pt states earlier tonight she got upset and scratched herself with a stick. Patient has a few small scratches to her left arm. Pt. Encouraged to let nursing staff know of any concerns or needs.

## 2019-10-01 NOTE — BH Assessment (Signed)
Assessment Note  Krista [name] is an 38 y.o. female. Krista [name] arrived to the ED by way of law enforcement under IVC, from [place].  She reports, "I came in today because I was in crisis".  "It all started when I got home today, I don't know, something triggered me . My roommate was saying that someone was saying things about me that was not true.  It set me off.  She went downstairs and told her step mom.  She called me downstairs and told me that she needed to use tough love on me.  She called my guardian.  I went downstairs and this situation is stressing me out.  I have to be someone else to please everyone else and me not to be happy.  I told Krista [name] that I wanted to stay somewhere else.  She stated that she already took care of that and told my guardian.  I then threatened to kill myself by drinking nail polish, but I could not get it open.  I started yelling and screaming and went outside yelling and screaming.  I was getting along fine with my roommate for the last couple of days and now it is this.  Krista [name] reports that she is depressed.  She reports symptoms of anxiety.  She denied having auditory or visual hallucinations.  She denied having a plan to harm herself. She denied homicidal ideation or intent.     TTS attempted to reach [facility] Krista [name] [phone]), No one answered.  Voicemail refers caller to contact Crisis phone at [phone].  Krista [name] gave consent to treat.     Diagnosis: Schizophrenia  Past Medical History:  Past Medical History:  Diagnosis Date  . Anxiety   . Depression   . HSV infection   . Mental retardation   . Schizophrenia [facility])     Past Surgical History:  Procedure Laterality Date  . CESAREAN SECTION      Family History:  Family History  Problem Relation Age of Onset  . Cancer Neg Hx     Social History:  reports that she has never smoked. She has never used smokeless tobacco. She reports that she does not drink alcohol or use  drugs.  Additional Social History:  Alcohol / Drug Use History of alcohol / drug use?: No history of alcohol / drug abuse  CIWA: CIWA-Ar BP: 112/78 Pulse Rate: 65 COWS:    Allergies: No Known Allergies  Home Medications: (Not in a hospital admission)   OB/GYN Status:  No LMP recorded. (Menstrual status: IUD).  General Assessment Data Location of Assessment: [facility] ED TTS Assessment: In system Is this a Tele or Face-to-Face Assessment?: Face-to-Face Is this an Initial Assessment or a Re-assessment for this encounter?: Initial Assessment Patient Accompanied by:: N/A Language Other than English: No Living Arrangements: Other (Comment)(Family Care home) What gender do you identify as?: Female Marital status: Single Pregnancy Status: No Living Arrangements: (Family Care home) Can pt return to current living arrangement?: Yes Admission Status: Involuntary Petitioner: Other Is patient capable of signing voluntary admission?: No Referral Source: Self/Family/Friend Insurance type: Medicaid  Medical Screening Exam ([place]) Medical Exam completed: Yes  Crisis Care Plan Living Arrangements: (Family Care home) Legal Guardian: Other:([place] [facility]) Name of Psychiatrist: Unsure Name of Therapist: Elmyra [name] - [place] [facility]  Education Status Is patient currently in school?: No Is the patient employed, unemployed or receiving disability?: Receiving disability income  Risk to self with the past 6  months Suicidal Ideation: No-Not Currently/Within Last 6 Months Has patient been a risk to self within the past 6 months prior to admission? : No Suicidal Intent: No Has patient had any suicidal intent within the past 6 months prior to admission? : No Is patient at risk for suicide?: No Suicidal Plan?: No Has patient had any suicidal plan within the past 6 months prior to admission? : No Access to Means: No What has been your use of drugs/alcohol within the last  12 months?: Denied use Previous Attempts/Gestures: Yes How many times?: 1 Other Self Harm Risks: denied Triggers for Past Attempts: Unknown Intentional Self Injurious Behavior: None Family Suicide History: No Recent stressful life event(s): Conflict (Comment)(arguing with house mate) Persecutory voices/beliefs?: No Depression: Yes Depression Symptoms: Despondent Substance abuse history and/or treatment for substance abuse?: No Suicide prevention information given to non-admitted patients: Not applicable  Risk to Others within the past 6 months Homicidal Ideation: No Does patient have any lifetime risk of violence toward others beyond the six months prior to admission? : No Thoughts of Harm to Others: No Current Homicidal Intent: No Current Homicidal Plan: No Access to Homicidal Means: No Identified Victim: none identified History of harm to others?: No Assessment of Violence: On admission Does patient have access to weapons?: No Criminal Charges Pending?: No Does patient have a court date: No Is patient on probation?: No  Psychosis Hallucinations: None noted Delusions: None noted  Mental Status Report Appearance/Hygiene: In scrubs Eye Contact: Poor Motor Activity: Unremarkable Speech: Logical/coherent, Tangential [name] of Consciousness: Alert Mood: Depressed Affect: Flat Anxiety [name]: Minimal Thought Processes: Tangential Judgement: Partial Orientation: Appropriate for developmental age Obsessive Compulsive Thoughts/Behaviors: None  Cognitive Functioning Concentration: Good Memory: Recent Intact Insight: Poor Impulse Control: Poor Appetite: Poor Have you had any weight changes? : No Change Sleep: No Change Vegetative Symptoms: None  ADLScreening [facility] Assessment Services) Patient's cognitive ability adequate to safely complete daily activities?: Yes Patient able to express need for assistance with ADLs?: Yes Independently performs ADLs?: Yes (appropriate  for developmental age)  Prior Inpatient Therapy Prior Inpatient Therapy: Yes Prior Therapy Dates: 2020 and prior Prior Therapy Facilty/Provider(s): [facility] Reason for Treatment: Schizophrenia  Prior Outpatient Therapy Prior Outpatient Therapy: Yes Prior Therapy Dates: Current Prior Therapy Facilty/Provider(s): Unknown Reason for Treatment: Schizophrenia  ADL Screening (condition at time of admission) Patient's cognitive ability adequate to safely complete daily activities?: Yes Is the patient deaf or have difficulty hearing?: No Does the patient have difficulty seeing, even when wearing glasses/contacts?: No Does the patient have difficulty concentrating, remembering, or making decisions?: No Patient able to express need for assistance with ADLs?: Yes Does the patient have difficulty dressing or bathing?: No Independently performs ADLs?: Yes (appropriate for developmental age) Does the patient have difficulty walking or climbing stairs?: No Weakness of Legs: None Weakness of Arms/Hands: Both(arthritis pains in arms)       Abuse/Neglect Assessment (Assessment to be complete while patient is alone) Abuse/Neglect Assessment Can Be Completed: (Denies a history of abuse)     Advance Directives (For Healthcare) Does Patient Have a Medical Advance Directive?: No          Disposition:     On Site Evaluation by:   Reviewed with Physician:    Justice Deeds 10/01/2019 1:52 AM

## 2019-10-01 NOTE — ED Notes (Signed)
Patient up to the bathroom, no sign of distress, will continue to monitor.

## 2019-10-01 NOTE — ED Notes (Signed)

## 2019-10-01 NOTE — ED Notes (Signed)
Sandwich and soft drink given.  

## 2019-10-01 NOTE — ED Notes (Signed)
Referral information for Psychiatric Hospitalization faxed to;   [facility] Crist[facility] [phone]),   . [facility] [phone] -or- [phone]) [phone]  . [place] ([phone]---[phone]),  . [facility] [phone], [phone], [phone] or [phone]),   . [place] [phone] or [phone])  . [facility] [phone]),   . [place]   . [facility] [phone] -or- [phone]),   . [facility] [phone] or [phone])  . [facility] [phone])

## 2019-10-01 NOTE — ED Notes (Signed)
Patient is being transferred to room 2 in the [facility], escorted by police and tech, Patient is calm and cooperative.

## 2019-10-01 NOTE — ED Notes (Signed)
Patient asking when she may get to leave and nurse let her know that referrals had been sent out and hopefully a bed would be available soon, she was calm and cooperative and no behavioral issues.
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
## 2019-10-01 NOTE — ED Notes (Signed)
IVC/Pending placement 
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
## 2019-10-01 NOTE — ED Notes (Signed)
Patient ate 100% of lunch and beverage.  

## 2019-10-01 NOTE — ED Notes (Signed)
Pt asleep, breakfast tray placed in chair beside bed.

## 2019-10-02 ENCOUNTER — Inpatient Hospital Stay
Admission: RE | Admit: 2019-10-02 | Discharge: 2019-10-11 | DRG: 885 | Disposition: A | Discharge status: Home / Self Care | Payer: Medicaid Other | Source: Intra-hospital | Attending: Psychiatry | Admitting: Psychiatry

## 2019-10-02 ENCOUNTER — Other Ambulatory Visit: Payer: Self-pay

## 2019-10-02 DIAGNOSIS — F200 Paranoid schizophrenia: Secondary | ICD-10-CM | POA: Diagnosis not present

## 2019-10-02 DIAGNOSIS — F209 Schizophrenia, unspecified: Secondary | ICD-10-CM | POA: Diagnosis present

## 2019-10-02 DIAGNOSIS — F70 Mild intellectual disabilities: Secondary | ICD-10-CM | POA: Diagnosis present

## 2019-10-02 DIAGNOSIS — R450 Nervousness: Secondary | ICD-10-CM | POA: Diagnosis not present

## 2019-10-02 DIAGNOSIS — F203 Undifferentiated schizophrenia: Secondary | ICD-10-CM

## 2019-10-02 DIAGNOSIS — Z79899 Other long term (current) drug therapy: Secondary | ICD-10-CM | POA: Diagnosis not present

## 2019-10-02 DIAGNOSIS — B009 Herpesviral infection, unspecified: Secondary | ICD-10-CM | POA: Diagnosis present

## 2019-10-02 DIAGNOSIS — K5900 Constipation, unspecified: Secondary | ICD-10-CM | POA: Diagnosis present

## 2019-10-02 DIAGNOSIS — F329 Major depressive disorder, single episode, unspecified: Secondary | ICD-10-CM | POA: Diagnosis present

## 2019-10-02 DIAGNOSIS — Z20828 Contact with and (suspected) exposure to other viral communicable diseases: Secondary | ICD-10-CM | POA: Diagnosis present

## 2019-10-02 DIAGNOSIS — F79 Unspecified intellectual disabilities: Secondary | ICD-10-CM

## 2019-10-02 DIAGNOSIS — R451 Restlessness and agitation: Secondary | ICD-10-CM | POA: Diagnosis present

## 2019-10-02 DIAGNOSIS — F419 Anxiety disorder, unspecified: Secondary | ICD-10-CM | POA: Diagnosis present

## 2019-10-02 DIAGNOSIS — F451 Undifferentiated somatoform disorder: Secondary | ICD-10-CM | POA: Diagnosis not present

## 2019-10-02 MED ORDER — PRAZOSIN HCL 2 MG PO CAPS
2.0000 mg | ORAL_CAPSULE | Freq: Every day | ORAL | Status: DC
  Administered 2019-10-02 – 2019-10-04 (×3): 2 mg via ORAL
  Filled 2019-10-02 (×3): qty 1

## 2019-10-02 MED ORDER — VENLAFAXINE HCL ER 75 MG PO CP24
225.0000 mg | ORAL_CAPSULE | Freq: Every day | ORAL | Status: DC
  Administered 2019-10-02 – 2019-10-11 (×10): 225 mg via ORAL
  Filled 2019-10-02 (×10): qty 3

## 2019-10-02 MED ORDER — MAGNESIUM HYDROXIDE 400 MG/5ML PO SUSP
30.0000 mL | Freq: Every day | ORAL | Status: DC | PRN
  Administered 2019-10-03 – 2019-10-08 (×4): 30 mL via ORAL
  Filled 2019-10-02 (×4): qty 30

## 2019-10-02 MED ORDER — OLANZAPINE 5 MG PO TBDP
15.0000 mg | ORAL_TABLET | Freq: Every day | ORAL | Status: DC
  Administered 2019-10-02: 15 mg via ORAL
  Filled 2019-10-02: qty 3

## 2019-10-02 MED ORDER — VALACYCLOVIR HCL 500 MG PO TABS
1000.0000 mg | ORAL_TABLET | Freq: Every day | ORAL | Status: DC
  Administered 2019-10-02 – 2019-10-11 (×10): 1000 mg via ORAL
  Filled 2019-10-02 (×11): qty 2

## 2019-10-02 MED ORDER — ACETAMINOPHEN 325 MG PO TABS
650.0000 mg | ORAL_TABLET | Freq: Four times a day (QID) | ORAL | Status: DC | PRN
  Administered 2019-10-03 – 2019-10-09 (×4): 650 mg via ORAL
  Filled 2019-10-02 (×4): qty 2

## 2019-10-02 MED ORDER — ALUM & MAG HYDROXIDE-SIMETH 200-200-20 MG/5ML PO SUSP
30.0000 mL | ORAL | Status: DC | PRN
  Filled 2019-10-02: qty 30

## 2019-10-02 MED ORDER — ARIPIPRAZOLE 10 MG PO TABS
20.0000 mg | ORAL_TABLET | Freq: Every day | ORAL | Status: DC
  Administered 2019-10-02 – 2019-10-04 (×3): 20 mg via ORAL
  Filled 2019-10-02 (×3): qty 2

## 2019-10-02 MED ORDER — VITAMIN D (ERGOCALCIFEROL) 1.25 MG (50000 UNIT) PO CAPS
50000.0000 [IU] | ORAL_CAPSULE | ORAL | Status: DC
  Administered 2019-10-08: 08:00:00 50000 [IU] via ORAL
  Filled 2019-10-02: qty 1

## 2019-10-02 MED ORDER — OXYBUTYNIN CHLORIDE 5 MG PO TABS
5.0000 mg | ORAL_TABLET | Freq: Every day | ORAL | Status: DC
  Administered 2019-10-02 – 2019-10-11 (×10): 5 mg via ORAL
  Filled 2019-10-02 (×11): qty 1

## 2019-10-02 MED ORDER — DIVALPROEX SODIUM 500 MG PO DR TAB
500.0000 mg | DELAYED_RELEASE_TABLET | Freq: Three times a day (TID) | ORAL | Status: DC
  Administered 2019-10-02 – 2019-10-11 (×28): 500 mg via ORAL
  Filled 2019-10-02 (×28): qty 1

## 2019-10-02 NOTE — ED Provider Notes (Signed)
-----------------------------------------   4:54 AM on 10/02/2019 -----------------------------------------   Blood pressure 112/78, pulse 65, temperature 98.1 F (36.7 C), resp. rate 18, height 1.651 m (5\' 5" ), weight 78 kg, SpO2 100 %.  The patient is calm and cooperative at this time.  The patient is being admitted downstairs to [place].   Hinda Kehr, MD 10/02/19 [phone]
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
## 2019-10-02 NOTE — BH Assessment (Signed)
Patient is to be admitted to [facility] by Psychiatric Nurse Practitioner Caroline Sauger.  Attending Physician will be Dr. Weber Cooks.   Patient has been assigned to room 320, by [place].   ER staff is aware of the admission:  Dr. Earnest Rosier, ER MD   Webb Silversmith, Patient's Nurse   Sharyn Lull,  Patient Access.
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
## 2019-10-02 NOTE — Tx Team (Signed)
Initial Treatment Plan 10/02/2019 6:45 AM Krista [name] SNK:[id number]    PATIENT STRESSORS: Health problems Medication change or noncompliance   PATIENT STRENGTHS: Motivation for treatment/growth Religious Affiliation Supportive family/friends   PATIENT IDENTIFIED PROBLEMS: Schizophrenia     Anxiety     Depression              DISCHARGE CRITERIA:  Improved stabilization in mood, thinking, and/or behavior Motivation to continue treatment in a less acute level of care  PRELIMINARY DISCHARGE PLAN: Outpatient therapy Participate in family therapy  PATIENT/FAMILY INVOLVEMENT: This treatment plan has been presented to and reviewed with the patient, Krista [name],  The patient and family have been given the opportunity to ask questions and make suggestions.  Harl Bowie, RN 10/02/2019, 6:45 AM
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
## 2019-10-02 NOTE — BHH Group Notes (Signed)
LCSW Group Therapy Note  10/02/2019 1:00 PM  Type of Therapy/Topic:  Group Therapy:  Emotion Regulation  Participation Level:  Active   Description of Group:   The purpose of this group is to assist patients in learning to regulate negative emotions and experience positive emotions. Patients will be guided to discuss ways in which they have been vulnerable to their negative emotions. These vulnerabilities will be juxtaposed with experiences of positive emotions or situations, and patients will be challenged to use positive emotions to combat negative ones. Special emphasis will be placed on coping with negative emotions in conflict situations, and patients will process healthy conflict resolution skills.  Therapeutic Goals: 1. Patient will identify two positive emotions or experiences to reflect on in order to balance out negative emotions 2. Patient will label two or more emotions that they find the most difficult to experience 3. Patient will demonstrate positive conflict resolution skills through discussion and/or role plays  Summary of Patient Progress: Patient was present in group.  Patient was supportive of other group members.  Patient shared that feelings of being overwhelmed have led to her feeling like she needs to gain control of her emotions. She reports that making assumptions has worked negatively for her in the past.  She shares that she journals and listens to music to remain calm.    Therapeutic Modalities:   Cognitive Behavioral Therapy Feelings Identification Dialectical Behavioral Therapy  Assunta Curtis, MSW, LCSW 10/02/2019 10:32 AM

## 2019-10-02 NOTE — Plan of Care (Signed)
Pt rates anxiety 6/10. Pt denies depression, SI, HI and AVH. Pt was educated on care plan and verbalizes understanding. Pt was encouraged to attend groups. Collier Bullock RN Problem: Education: Goal: Ability to make informed decisions regarding treatment will improve Outcome: Progressing   Problem: Coping: Goal: Coping ability will improve Outcome: Progressing   Problem: Health Behavior/Discharge Planning: Goal: Identification of resources available to assist in meeting health care needs will improve Outcome: Progressing   Problem: Medication: Goal: Compliance with prescribed medication regimen will improve Outcome: Progressing   Problem: Self-Concept: Goal: Ability to disclose and discuss suicidal ideas will improve Outcome: Progressing Goal: Will verbalize positive feelings about self Outcome: Progressing   Problem: Education: Goal: Utilization of techniques to improve thought processes will improve Outcome: Progressing Goal: Knowledge of the prescribed therapeutic regimen will improve Outcome: Progressing   Problem: Activity: Goal: Interest or engagement in leisure activities will improve Outcome: Progressing Goal: Imbalance in normal sleep/wake cycle will improve Outcome: Progressing   Problem: Coping: Goal: Coping ability will improve Outcome: Progressing Goal: Will verbalize feelings Outcome: Progressing   Problem: Health Behavior/Discharge Planning: Goal: Ability to make decisions will improve Outcome: Progressing Goal: Compliance with therapeutic regimen will improve Outcome: Progressing   Problem: Role Relationship: Goal: Will demonstrate positive changes in social behaviors and relationships Outcome: Progressing   Problem: Safety: Goal: Ability to disclose and discuss suicidal ideas will improve Outcome: Progressing Goal: Ability to identify and utilize support systems that promote safety will improve Outcome: Progressing   Problem:  Self-Concept: Goal: Will verbalize positive feelings about self Outcome: Progressing Goal: Level of anxiety will decrease Outcome: Progressing   Problem: Education: Goal: Knowledge of disease or condition will improve Outcome: Progressing Goal: Understanding of discharge needs will improve Outcome: Progressing   Problem: Health Behavior/Discharge Planning: Goal: Ability to identify changes in lifestyle to reduce recurrence of condition will improve Outcome: Progressing Goal: Identification of resources available to assist in meeting health care needs will improve Outcome: Progressing   Problem: Physical Regulation: Goal: Complications related to the disease process, condition or treatment will be avoided or minimized Outcome: Progressing   Problem: Safety: Goal: Ability to remain free from injury will improve Outcome: Progressing   Problem: Activity: Goal: Will identify at least one activity in which they can participate Outcome: Progressing   Problem: Coping: Goal: Ability to identify and develop effective coping behavior will improve Outcome: Progressing Goal: Ability to interact with others will improve Outcome: Progressing Goal: Demonstration of participation in decision-making regarding own care will improve Outcome: Progressing Goal: Ability to use eye contact when communicating with others will improve Outcome: Progressing   Problem: Health Behavior/Discharge Planning: Goal: Identification of resources available to assist in meeting health care needs will improve Outcome: Progressing   Problem: Self-Concept: Goal: Will verbalize positive feelings about self Outcome: Progressing
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
## 2019-10-02 NOTE — [facility] Counselor (Signed)
CSW attempted to call the patients guardian Solmon Ice, [phone].  CSW left HIPAA compliant voicemail.  Assunta Curtis, MSW, LCSW 10/02/2019 9:11 AM

## 2019-10-02 NOTE — [facility] Counselor (Signed)
CSW attempted to call the patient's [facility], [facility], [phone], however was unable to speak with someone.  CSW left HIPAA compliant voicemail.   Assunta Curtis, MSW, LCSW 10/02/2019 1:59 PM
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
## 2019-10-02 NOTE — BHH Suicide Risk Assessment (Signed)
[facility] INPATIENT:  Family/Significant Other Suicide Prevention Education  Suicide Prevention Education:  Contact Attempts:  Legal guardian Krista [name] - [phone] has been identified by the patient as the family member/significant other with whom the patient will be residing, and identified as the person(s) who will aid the patient in the event of a mental health crisis.  With written consent from the patient, two attempts were made to provide suicide prevention education, prior to and/or following the patient's discharge.  We were unsuccessful in providing suicide prevention education.  A suicide education pamphlet was given to the patient to share with family/significant other.  Date and time of first attempt: 10/02/2019 at 9:11AM Date and time of second attempt: Second attempt is needed  Krista [name] 10/02/2019, 10:28 AM

## 2019-10-02 NOTE — Progress Notes (Signed)
Pt has been calm and cooperative today. Pt has attended groups. Collier Bullock RN

## 2019-10-02 NOTE — Progress Notes (Signed)
Recreation Therapy Notes    Date: 10/02/2019  Time: 9:30 am   Location: Craft room   Behavioral response: N/A   Intervention Topic: Coping skills  Discussion/Intervention: Patient did not attend group.   Clinical Observations/Feedback:  Patient did not attend group.   Osha Rane LRT/CTRS        Niobe Dick 10/02/2019 10:52 AM

## 2019-10-03 DIAGNOSIS — F203 Undifferentiated schizophrenia: Secondary | ICD-10-CM | POA: Diagnosis not present

## 2019-10-03 MED ORDER — ARIPIPRAZOLE ER 400 MG IM PRSY
400.0000 mg | PREFILLED_SYRINGE | INTRAMUSCULAR | Status: DC
  Filled 2019-10-03: qty 400

## 2019-10-03 NOTE — Progress Notes (Signed)
Pt has been calm and cooperative today. She  has attended groups and enjoyed being outside. Collier Bullock RN
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
## 2019-10-03 NOTE — Progress Notes (Signed)
Recreation Therapy Notes  INPATIENT RECREATION THERAPY ASSESSMENT  Patient Details Name: Alizey Noren MRN: [id number] DOB: [birth year] Today's Date: 10/03/2019       Information Obtained From: Patient  Able to Participate in Assessment/Interview: Yes  Patient Presentation: Responsive  Reason for Admission (Per Patient): Active Symptoms, Self-injurious Behavior  Patient Stressors:    Coping Skills:   Exercise  Leisure Interests (2+):  Exercise - Walking, Exercise - Running, Individual - Reading, Individual - Journaling, Individual - Writing  Frequency of Recreation/Participation: Weekly  Awareness of Community Resources:     [employer]:     Current Use:    If no, Barriers?:    Expressed Interest in [place] of Residence:  Insurance underwriter  Patient Main Form of Transportation: Other (Comment)(Group home)  Patient Strengths:  N/A  Patient Identified Areas of Improvement:  Be a better mom  Patient Goal for Hospitalization:  Talking more and expressing my feelings  Current SI (including self-harm):  No  Current HI:  No  Current AVH: No  Staff Intervention Plan: Group Attendance, Collaborate with Interdisciplinary Treatment Team  Consent to Intern Participation: N/A  Li Bobo 10/03/2019, 12:20 PM

## 2019-10-03 NOTE — Progress Notes (Signed)
Patient is stable and cooperating with medication regimen , denies any current active or passive suicidal thoughts or psychotic symptoms , no somatic complains , patient did attends groups with good participations , denies any complains , only requiring 15 minutes safety checks.

## 2019-10-03 NOTE — BHH Group Notes (Signed)
LCSW Group Therapy Note  10/03/2019 2:00 PM  Type of Therapy/Topic:  Group Therapy:  Balance in Life  Participation Level:  Active  Description of Group:    This group will address the concept of balance and how it feels and looks when one is unbalanced. Patients will be encouraged to process areas in their lives that are out of balance and identify reasons for remaining unbalanced. Facilitators will guide patients in utilizing problem-solving interventions to address and correct the stressor making their life unbalanced. Understanding and applying boundaries will be explored and addressed for obtaining and maintaining a balanced life. Patients will be encouraged to explore ways to assertively make their unbalanced needs known to significant others in their lives, using other group members and facilitator for support and feedback.  Therapeutic Goals: 1. Patient will identify two or more emotions or situations they have that consume much of in their lives. 2. Patient will identify signs/triggers that life has become out of balance:  3. Patient will identify two ways to set boundaries in order to achieve balance in their lives:  4. Patient will demonstrate ability to communicate their needs through discussion and/or role plays  Summary of Patient Progress: Pt was appropriate and respectful in group. Pt was able to identify the areas in her life that need improvement. Pt reported that she does not have a career and for home pt identified it as "poor". Pt reported that her wheel is kind of bumpy and she does not want to keep coming to the hospital. Pt identified herself as being what she is in control of and reported that she has to manage her own reactions.     Therapeutic Modalities:   Cognitive Behavioral Therapy Solution-Focused Therapy Assertiveness Training  Evalina Field, MSW, LCSW Clinical Social Work 10/03/2019 2:00 PM

## 2019-10-03 NOTE — [place] FL2 (Signed)
[place] MEDICAID FL2 LEVEL OF CARE SCREENING TOOL     IDENTIFICATION  Patient Name: Krista [name] Birthdate: [birth year] Sex: female Admission Date (Current Location): 10/02/2019  [place] and [place] Number:  [name] [id number] P Facility and Address:         Provider Number: [id number]  Attending Physician Name and Address:  Audery Amel, MD  Relative Name and Phone Number:  [name], legal guardian [phone]    Current Level of Care: Hospital Recommended Level of Care: [facility], Other (Comment)(group home) Prior Approval Number:    Date Approved/Denied:   PASRR Number:    Discharge Plan: Other (Comment)(group home)    Current Diagnoses: Patient Active Problem List   Diagnosis Date Noted  . Schizophrenia (HCC) 10/02/2019  . Agitation   . IUD check up 09/06/2016  . Pelvic pain 09/06/2016  . Abnormal uterine bleeding (AUB) 09/06/2016  . Suicidal behavior 03/08/2016  . IUD contraception 03/02/2016  . Herpes genitalis 12/28/2015  . Borderline personality disorder (HCC) 09/16/2015  . Intellectual disability 09/16/2015  . Schizoaffective disorder, depressive type (HCC) 09/16/2015  . Mental retardation 05/29/2015    Orientation RESPIRATION BLADDER Height & Weight     Self, Time, Situation, Place  Normal Continent Weight: 180 lb (81.6 kg) Height:  5\' 5"  (165.1 cm)  BEHAVIORAL SYMPTOMS/MOOD NEUROLOGICAL BOWEL NUTRITION STATUS  Dangerous to self, others or property(dangerous to property by history) (none reported) Continent Diet(normal)  AMBULATORY STATUS COMMUNICATION OF NEEDS Skin   Independent Verbally Normal                       Personal Care Assistance Level of Assistance  (none reported)           Functional Limitations Info  (none reported)          SPECIAL CARE FACTORS FREQUENCY  (none reported)                    Contractures Contractures Info: Not present    Additional Factors Info  Code Status Code  Status Info: Full             Current Medications (10/03/2019):  This is the current hospital active medication list Current Facility-Administered Medications  Medication Dose Route Frequency Provider Last Rate Last Dose  . acetaminophen (TYLENOL) tablet 650 mg  650 mg Oral Q6H PRN 10/05/2019, NP      . alum & mag hydroxide-simeth (MAALOX/MYLANTA) 200-200-20 MG/5ML suspension 30 mL  30 mL Oral Q4H PRN 06-13-2001, NP      . ARIPiprazole (ABILIFY) tablet 20 mg  20 mg Oral Daily Gillermo Murdoch, NP   20 mg at 10/03/19 0816  . divalproex (DEPAKOTE) DR tablet 500 mg  500 mg Oral TID 10/05/19, NP   500 mg at 10/03/19 0816  . magnesium hydroxide (MILK OF MAGNESIA) suspension 30 mL  30 mL Oral Daily PRN 10/05/19, NP      . oxybutynin (DITROPAN) tablet 5 mg  5 mg Oral Daily Gillermo Murdoch, NP   5 mg at 10/03/19 10/05/19  . prazosin (MINIPRESS) capsule 2 mg  2 mg Oral QHS 1027, NP   2 mg at 10/02/19 2215  . valACYclovir (VALTREX) tablet 1,000 mg  1,000 mg Oral Daily 2216, NP   1,000 mg at 10/03/19 0816  . venlafaxine XR (EFFEXOR-XR) 24 hr capsule 225 mg  225 mg Oral Daily 10/05/19, NP   225 mg at 10/03/19 0815  . [  START ON 10/08/2019] Vitamin D (Ergocalciferol) (DRISDOL) capsule 50,000 Units  50,000 Units Oral Q Ivar Drape, NP         Discharge Medications: Please see discharge summary for a list of discharge medications.  Relevant Imaging Results:  Relevant Lab Results:   Additional Bishop, LCSW

## 2019-10-03 NOTE — Progress Notes (Signed)
Recreation Therapy Notes   Date: 10/03/2019  Time: 9:30 am  Location: Craft room  Behavioral response: Appropriate   Intervention Topic: Leisure  Discussion/Intervention:  Group content today was focused on leisure. The group defined what leisure is and some positive leisure activities they participate in. Individuals identified the difference between good and bad leisure. Participants expressed how they feel after participating in the leisure of their choice. The group discussed how they go about picking a leisure activity and if others are involved in their leisure activities. The patient stated how many leisure activities they too choose from and reasons why it is important to have leisure time. Individuals participated in the intervention "Exploration of Leisure" where they had a chance to identify new leisure activities as well as benefits of leisure. Clinical Observations/Feedback:  Patient came to group and defined leisure as exercise. She stated she enjoy writing in her journal during her leisure time. Participant explained that leisure takes her mind off life stressors. Individual was social with peers and staff while participating in group.  Krista [name] LRT/CTRS         Krista [name] 10/03/2019 11:38 AM

## 2019-10-03 NOTE — Plan of Care (Signed)
  Problem: Education: Goal: Ability to make informed decisions regarding treatment will improve Outcome: Progressing   Problem: Coping: Goal: Coping ability will improve Outcome: Progressing   Problem: Health Behavior/Discharge Planning: Goal: Identification of resources available to assist in meeting health care needs will improve Outcome: Progressing   Problem: Medication: Goal: Compliance with prescribed medication regimen will improve Outcome: Progressing   Problem: Self-Concept: Goal: Ability to disclose and discuss suicidal ideas will improve Outcome: Progressing Goal: Will verbalize positive feelings about self Outcome: Progressing   Problem: Education: Goal: Utilization of techniques to improve thought processes will improve Outcome: Progressing Goal: Knowledge of the prescribed therapeutic regimen will improve Outcome: Progressing   Problem: Activity: Goal: Interest or engagement in leisure activities will improve Outcome: Progressing Goal: Imbalance in normal sleep/wake cycle will improve Outcome: Progressing   Problem: Coping: Goal: Coping ability will improve Outcome: Progressing Goal: Will verbalize feelings Outcome: Progressing   Problem: Health Behavior/Discharge Planning: Goal: Ability to make decisions will improve Outcome: Progressing Goal: Compliance with therapeutic regimen will improve Outcome: Progressing   Problem: Role Relationship: Goal: Will demonstrate positive changes in social behaviors and relationships Outcome: Progressing   Problem: Safety: Goal: Ability to disclose and discuss suicidal ideas will improve Outcome: Progressing Goal: Ability to identify and utilize support systems that promote safety will improve Outcome: Progressing   Problem: Self-Concept: Goal: Will verbalize positive feelings about self Outcome: Progressing Goal: Level of anxiety will decrease Outcome: Progressing   Problem: Education: Goal: Knowledge of  disease or condition will improve Outcome: Progressing Goal: Understanding of discharge needs will improve Outcome: Progressing   Problem: Health Behavior/Discharge Planning: Goal: Ability to identify changes in lifestyle to reduce recurrence of condition will improve Outcome: Progressing Goal: Identification of resources available to assist in meeting health care needs will improve Outcome: Progressing   Problem: Physical Regulation: Goal: Complications related to the disease process, condition or treatment will be avoided or minimized Outcome: Progressing   Problem: Safety: Goal: Ability to remain free from injury will improve Outcome: Progressing   Problem: Activity: Goal: Will identify at least one activity in which they can participate Outcome: Progressing   Problem: Coping: Goal: Ability to identify and develop effective coping behavior will improve Outcome: Progressing Goal: Ability to interact with others will improve Outcome: Progressing Goal: Demonstration of participation in decision-making regarding own care will improve Outcome: Progressing Goal: Ability to use eye contact when communicating with others will improve Outcome: Progressing   Problem: Health Behavior/Discharge Planning: Goal: Identification of resources available to assist in meeting health care needs will improve Outcome: Progressing   Problem: Self-Concept: Goal: Will verbalize positive feelings about self Outcome: Progressing   
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
## 2019-10-03 NOTE — Progress Notes (Signed)
Patient alert and oriented x 4, affect is blunted, she appears assertive and receptive to staff, no distress noted, she denies SI/HI/AVH no paranoia or aggressive behavior from patient, she was complaint with medication regimen, interacting appropriately with peers and staff. Patient was offered emotional support and encouraged to attend wrap up group. Patient attended evening wrap up group, and was compliant with medication regimen. 15 minutes safety checks maintained will continue to monitor.
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
## 2019-10-03 NOTE — Progress Notes (Addendum)
CSW spoke with Alla Feeling who reported he does have a female bed open, but requested information be faxed to [phone]. CSW faxed information. Fax was successful.   CSW spoke with [place] who reported she does not have a female bed, but she has a friend who does and she will give her friend CSW phone number and her friend will call to obtain information about the pt.  Evalina Field, MSW, LCSW Clinical Social Work 10/03/2019 11:21 AM

## 2019-10-04 DIAGNOSIS — F79 Unspecified intellectual disabilities: Secondary | ICD-10-CM | POA: Diagnosis not present

## 2019-10-04 DIAGNOSIS — R450 Nervousness: Secondary | ICD-10-CM | POA: Diagnosis not present

## 2019-10-04 DIAGNOSIS — R451 Restlessness and agitation: Secondary | ICD-10-CM

## 2019-10-04 DIAGNOSIS — F203 Undifferentiated schizophrenia: Secondary | ICD-10-CM | POA: Diagnosis not present

## 2019-10-04 MED ORDER — ARIPIPRAZOLE 10 MG PO TABS
10.0000 mg | ORAL_TABLET | Freq: Every day | ORAL | Status: DC
  Administered 2019-10-05: 10 mg via ORAL
  Filled 2019-10-04: qty 1

## 2019-10-04 MED ORDER — HYDROXYZINE HCL 50 MG PO TABS
50.0000 mg | ORAL_TABLET | Freq: Three times a day (TID) | ORAL | Status: DC | PRN
  Administered 2019-10-04 – 2019-10-09 (×5): 50 mg via ORAL
  Filled 2019-10-04 (×6): qty 1

## 2019-10-04 NOTE — Progress Notes (Signed)
Pt attended all groups today. Pt complained of anxiety today but after a PRN staed that she felt relief. Collier Bullock RN

## 2019-10-04 NOTE — B[facility] Counselor (Signed)
CSW spoke with Adonis Huguenin Timmons(group home owner) who states she has female beds avaiable but would like to further review documentation before making decision on if she is interested in placing the patient. She encouraged CSW to follow up with her on 11/2 to discuss placement.

## 2019-10-04 NOTE — Progress Notes (Signed)
Recreation Therapy Notes   Date: 10/04/2019  Time: 9:30 am  Location: Craft room  Behavioral response: Appropriate   Intervention Topic: Communication  Discussion/Intervention:  Group content today was focused on communication. The group defined communication and ways to communicate with others. Individuals stated reason why communication is important and some reasons to communicate with others. Patients expressed if they thought they were good at communicating with others and ways they could improve their communication skills. The group identified important parts of communication and some experiences they have had in the past with communication. The group participated in the intervention "What is that?", where they had a chance to test out their communication skills and identify ways to improve their communication techniques.  Clinical Observations/Feedback:  Patient came to group and defined communication journaling and writing things down before you say them. She explained that facial expression and sign language are other way to communicate with people. Individual was social with peers and staff while participating in the intervention.    Gurkaran Rahm LRT/CTRS         Ysabelle Goodroe 10/04/2019 10:49 AM
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
## 2019-10-04 NOTE — Plan of Care (Signed)
Pt denies anxiety, depression, SI, HI and AVH. Pt was educated on care plan and verbalizes understanding. Collier Bullock RN Problem: Education: Goal: Ability to make informed decisions regarding treatment will improve Outcome: Progressing   Problem: Coping: Goal: Coping ability will improve Outcome: Progressing   Problem: Health Behavior/Discharge Planning: Goal: Identification of resources available to assist in meeting health care needs will improve Outcome: Progressing   Problem: Medication: Goal: Compliance with prescribed medication regimen will improve Outcome: Progressing   Problem: Self-Concept: Goal: Ability to disclose and discuss suicidal ideas will improve Outcome: Progressing Goal: Will verbalize positive feelings about self Outcome: Progressing   Problem: Education: Goal: Utilization of techniques to improve thought processes will improve Outcome: Progressing Goal: Knowledge of the prescribed therapeutic regimen will improve Outcome: Progressing   Problem: Activity: Goal: Interest or engagement in leisure activities will improve Outcome: Progressing Goal: Imbalance in normal sleep/wake cycle will improve Outcome: Progressing   Problem: Coping: Goal: Coping ability will improve Outcome: Progressing Goal: Will verbalize feelings Outcome: Progressing   Problem: Health Behavior/Discharge Planning: Goal: Ability to make decisions will improve Outcome: Progressing Goal: Compliance with therapeutic regimen will improve Outcome: Progressing   Problem: Role Relationship: Goal: Will demonstrate positive changes in social behaviors and relationships Outcome: Progressing   Problem: Safety: Goal: Ability to disclose and discuss suicidal ideas will improve Outcome: Progressing Goal: Ability to identify and utilize support systems that promote safety will improve Outcome: Progressing   Problem: Self-Concept: Goal: Will verbalize positive feelings about  self Outcome: Progressing Goal: Level of anxiety will decrease Outcome: Progressing   Problem: Education: Goal: Knowledge of disease or condition will improve Outcome: Progressing Goal: Understanding of discharge needs will improve Outcome: Progressing   Problem: Health Behavior/Discharge Planning: Goal: Ability to identify changes in lifestyle to reduce recurrence of condition will improve Outcome: Progressing Goal: Identification of resources available to assist in meeting health care needs will improve Outcome: Progressing   Problem: Physical Regulation: Goal: Complications related to the disease process, condition or treatment will be avoided or minimized Outcome: Progressing   Problem: Safety: Goal: Ability to remain free from injury will improve Outcome: Progressing   Problem: Activity: Goal: Will identify at least one activity in which they can participate Outcome: Progressing   Problem: Coping: Goal: Ability to identify and develop effective coping behavior will improve Outcome: Progressing Goal: Ability to interact with others will improve Outcome: Progressing Goal: Demonstration of participation in decision-making regarding own care will improve Outcome: Progressing Goal: Ability to use eye contact when communicating with others will improve Outcome: Progressing   Problem: Health Behavior/Discharge Planning: Goal: Identification of resources available to assist in meeting health care needs will improve Outcome: Progressing   Problem: Self-Concept: Goal: Will verbalize positive feelings about self Outcome: Progressing

## 2019-10-04 NOTE — [facility] Group Notes (Signed)
LCSW Group Therapy Note   10/04/2019 1:00 PM  Type of Therapy and Topic:  Group Therapy:  Overcoming Obstacles   Participation Level:  Active   Description of Group:    In this group patients will be encouraged to explore what they see as obstacles to their own wellness and recovery. They will be guided to discuss their thoughts, feelings, and behaviors related to these obstacles. The group will process together ways to cope with barriers, with attention given to specific choices patients can make. Each patient will be challenged to identify changes they are motivated to make in order to overcome their obstacles. This group will be process-oriented, with patients participating in exploration of their own experiences as well as giving and receiving support and challenge from other group members.   Therapeutic Goals: 1. Patient will identify personal and current obstacles as they relate to admission. 2. Patient will identify barriers that currently interfere with their wellness or overcoming obstacles.  3. Patient will identify feelings, thought process and behaviors related to these barriers. 4. Patient will identify two changes they are willing to make to overcome these obstacles:      Summary of Patient Progress Patient was present and attentive in group. Patient shared that she is struggling with placement and her anger outbursts.  Patient appeared remorseful for her action.  Patient was supportive of other group members.  Patient shared how she sometimes struggles with communication.  Patient engaged in discussion on coping skills identifying that she does yoga.   Therapeutic Modalities:   Cognitive Behavioral Therapy Solution Focused Therapy Motivational Interviewing Relapse Prevention Therapy  Assunta Curtis, MSW, LCSW 10/04/2019 12:52 PM

## 2019-10-05 DIAGNOSIS — F451 Undifferentiated somatoform disorder: Secondary | ICD-10-CM | POA: Diagnosis not present

## 2019-10-05 DIAGNOSIS — F79 Unspecified intellectual disabilities: Secondary | ICD-10-CM | POA: Diagnosis not present

## 2019-10-05 DIAGNOSIS — F200 Paranoid schizophrenia: Secondary | ICD-10-CM | POA: Diagnosis not present

## 2019-10-05 LAB — VALPROIC ACID LEVEL: Valproic Acid Lvl: 80 ug/mL (ref 50.0–100.0)

## 2019-10-05 MED ORDER — PRAZOSIN HCL 2 MG PO CAPS
4.0000 mg | ORAL_CAPSULE | Freq: Every day | ORAL | Status: DC
  Administered 2019-10-05 – 2019-10-10 (×6): 4 mg via ORAL
  Filled 2019-10-05 (×6): qty 2

## 2019-10-05 MED ORDER — BENZTROPINE MESYLATE 1 MG PO TABS
0.5000 mg | ORAL_TABLET | Freq: Two times a day (BID) | ORAL | Status: DC
  Administered 2019-10-05 – 2019-10-06 (×3): 0.5 mg via ORAL
  Filled 2019-10-05 (×3): qty 1

## 2019-10-05 MED ORDER — RISPERIDONE 1 MG PO TABS
2.0000 mg | ORAL_TABLET | Freq: Two times a day (BID) | ORAL | Status: DC
  Administered 2019-10-05 – 2019-10-06 (×3): 2 mg via ORAL
  Filled 2019-10-05 (×3): qty 2

## 2019-10-05 NOTE — Progress Notes (Signed)
Patient is alert and oriented and has been pleasant and calm upon approach , has insights into circumstances leading to hospitalization but compliant with her medical regimen , denies any SI/HI/AVH , no out burst or violent behaviors , sleep long hours and appetite is good , and has no physical complains only requiring 15 minutes safety checks , education and support is provided no distress.

## 2019-10-05 NOTE — Plan of Care (Signed)
  Problem: Education: Goal: Ability to make informed decisions regarding treatment will improve Outcome: Progressing   Problem: Coping: Goal: Coping ability will improve Outcome: Progressing   Problem: Health Behavior/Discharge Planning: Goal: Identification of resources available to assist in meeting health care needs will improve Outcome: Progressing   Problem: Medication: Goal: Compliance with prescribed medication regimen will improve Outcome: Progressing   Problem: Self-Concept: Goal: Ability to disclose and discuss suicidal ideas will improve Outcome: Progressing Goal: Will verbalize positive feelings about self Outcome: Progressing   Problem: Education: Goal: Utilization of techniques to improve thought processes will improve Outcome: Progressing Goal: Knowledge of the prescribed therapeutic regimen will improve Outcome: Progressing   Problem: Activity: Goal: Interest or engagement in leisure activities will improve Outcome: Progressing Goal: Imbalance in normal sleep/wake cycle will improve Outcome: Progressing   Problem: Coping: Goal: Coping ability will improve Outcome: Progressing Goal: Will verbalize feelings Outcome: Progressing   Problem: Health Behavior/Discharge Planning: Goal: Ability to make decisions will improve Outcome: Progressing Goal: Compliance with therapeutic regimen will improve Outcome: Progressing   Problem: Role Relationship: Goal: Will demonstrate positive changes in social behaviors and relationships Outcome: Progressing   Problem: Safety: Goal: Ability to disclose and discuss suicidal ideas will improve Outcome: Progressing Goal: Ability to identify and utilize support systems that promote safety will improve Outcome: Progressing   Problem: Self-Concept: Goal: Will verbalize positive feelings about self Outcome: Progressing Goal: Level of anxiety will decrease Outcome: Progressing   Problem: Education: Goal: Knowledge of  disease or condition will improve Outcome: Progressing Goal: Understanding of discharge needs will improve Outcome: Progressing   Problem: Health Behavior/Discharge Planning: Goal: Ability to identify changes in lifestyle to reduce recurrence of condition will improve Outcome: Progressing Goal: Identification of resources available to assist in meeting health care needs will improve Outcome: Progressing   Problem: Physical Regulation: Goal: Complications related to the disease process, condition or treatment will be avoided or minimized Outcome: Progressing   Problem: Safety: Goal: Ability to remain free from injury will improve Outcome: Progressing   Problem: Activity: Goal: Will identify at least one activity in which they can participate Outcome: Progressing   Problem: Coping: Goal: Ability to identify and develop effective coping behavior will improve Outcome: Progressing Goal: Ability to interact with others will improve Outcome: Progressing Goal: Demonstration of participation in decision-making regarding own care will improve Outcome: Progressing Goal: Ability to use eye contact when communicating with others will improve Outcome: Progressing   Problem: Health Behavior/Discharge Planning: Goal: Identification of resources available to assist in meeting health care needs will improve Outcome: Progressing   Problem: Self-Concept: Goal: Will verbalize positive feelings about self Outcome: Progressing

## 2019-10-05 NOTE — Plan of Care (Signed)
Data: Patient denies SI/HI/AVH. Patient is appropriate and cooperative to assessment. Patient is seen in milieu interacting with peers. Patient is pleasant and cooperative throughout the day.   Action:  Q x 15 minute observation checks were completed for safety. Patient was provided with education on medications. Patient was offered support and encouragement. Patient was given scheduled medications. Patient  was encourage to attend groups, participate in unit activities and continue with plan of care.     Response: Patient has no complaints at this time. Patient is receptive to treatment and safety maintained on unit.    Problem: Education: Goal: Ability to make informed decisions regarding treatment will improve Outcome: Not Progressing   Problem: Coping: Goal: Coping ability will improve Outcome: Not Progressing   Problem: Health Behavior/Discharge Planning: Goal: Identification of resources available to assist in meeting health care needs will improve Outcome: Not Progressing

## 2019-10-05 NOTE — [facility] Group Notes (Signed)
LCSW Aftercare Discharge Planning Group Note   10/05/2019 1300  Type of Group and Topic: Psychoeducational Group:  Discharge Planning  Participation Level:  Active  Description of Group  Discharge planning group reviews patient's anticipated discharge plans and assists patients to anticipate and address any barriers to wellness/recovery in the community.  Suicide prevention education is reviewed with patients in group.  Therapeutic Goals 1. Patients will state their anticipated discharge plan and mental health aftercare 2. Patients will identify potential barriers to wellness in the community setting 3. Patients will engage in problem solving, solution focused discussion of ways to anticipate and address barriers to wellness/recovery  Summary of Patient Progress: pt quiet for most of group, but did make several good comments during the discussion about wellness, social supports, and scenarios that could take place after discharge.     Plan for Discharge/Comments:    Transportation Means:   Supports:  Therapeutic Modalities: Motivational Interviewing    Joanne Chars, LCSW 10/05/2019 2:21 PM

## 2019-10-05 NOTE — Progress Notes (Signed)
Patient alert and oriented x 4, affect is blunted, she appears flat but brightens upon approach and receptive to staff, no distress noted, she denies SI/HI/AVH no aggressive behavior on the unit,  she is complaint with medication regimen and interacting appropriately with peers and staff. Patient was offered emotional support and encouraged to attend wrap up group. Patient attended evening wrap up group, 15 minutes safety checks maintained will continue to monitor.

## 2019-10-05 NOTE — Progress Notes (Signed)
[facility] MD Progress Note  10/05/2019 9:11 AM Krista [name]  MRN:  [id number] Subjective:    This is a repeat admission for Krista [name], 38 year old patient with a known schizophrenic disorder who was destructive at her group home and she states "it was my fault, I had anger" and she describes an anger outburst routine and some paranoia, believing people were talking about her.  She states she can generally sleep if she does not have nightmares she states that she is feeling better she is worried that she will not be allowed back at her assisted living facility.  She denies current auditory or visual hallucinations, she denies thoughts of harming self or others, and speaks insightfully about the need to control her anger and not let paranoia propelled her actions. She reports in the past she has been helped with Risperdal  Principal Problem: Schizophrenia ([place]) Diagnosis: Principal Problem:   Schizophrenia ([place]) Active Problems:   Intellectual disability   Agitation  Total Time spent with patient: 20 minutes  Past Psychiatric History: Prior presentations that are similar  Past Medical History:  Past Medical History:  Diagnosis Date  . Anxiety   . Depression   . HSV infection   . Mental retardation   . Schizophrenia [facility])     Past Surgical History:  Procedure Laterality Date  . CESAREAN SECTION     Family History:  Family History  Problem Relation Age of Onset  . Cancer Neg Hx    Family Psychiatric  History: No new data shared Social History:  Social History   Substance and Sexual Activity  Alcohol Use No     Social History   Substance and Sexual Activity  Drug Use No    Social History   Socioeconomic History  . Marital status: Significant Other    Spouse name: Not on file  . Number of children: Not on file  . Years of education: Not on file  . Highest education level: Not on file  Occupational History  . Not on file  Social Needs  . Financial resource  strain: Not on file  . Food insecurity    Worry: Not on file    Inability: Not on file  . Transportation needs    Medical: Not on file    Non-medical: Not on file  Tobacco Use  . Smoking status: Never Smoker  . Smokeless tobacco: Never Used  Substance and Sexual Activity  . Alcohol use: No  . Drug use: No  . Sexual activity: Yes    Birth control/protection: I.U.D., Condom  Lifestyle  . Physical activity    Days per week: Not on file    Minutes per session: Not on file  . Stress: Not on file  Relationships  . Social Herbalist on phone: Not on file    Gets together: Not on file    Attends religious service: Not on file    Active member of club or organization: Not on file    Attends meetings of clubs or organizations: Not on file    Relationship status: Not on file  Other Topics Concern  . Not on file  Social History Narrative  . Not on file   Additional Social History:                         Sleep: Fair  Appetite:  Fair  Current Medications: Current Facility-Administered Medications  Medication Dose Route Frequency Provider Last Rate Last  Dose  . acetaminophen (TYLENOL) tablet 650 mg  650 mg Oral Q6H PRN Gillermo Murdochhompson, Jacqueline, NP   650 mg at 10/03/19 2124  . alum & mag hydroxide-simeth (MAALOX/MYLANTA) 200-200-20 MG/5ML suspension 30 mL  30 mL Oral Q4H PRN Gillermo Murdochhompson, Jacqueline, NP      . Melene Muller[START ON 10/16/2019] ARIPiprazole ER (ABILIFY MAINTENA) 400 MG prefilled syringe 400 mg  400 mg Intramuscular Q21 days Money, Gerlene Burdockravis B, FNP      . benztropine (COGENTIN) tablet 0.5 mg  0.5 mg Oral BID Malvin JohnsFarah, Lajuanna Pompa, MD      . divalproex (DEPAKOTE) DR tablet 500 mg  500 mg Oral TID Gillermo Murdochhompson, Jacqueline, NP   500 mg at 10/05/19 [id number]  . hydrOXYzine (ATARAX/VISTARIL) tablet 50 mg  50 mg Oral TID PRN Money, Gerlene Burdockravis B, FNP   50 mg at 10/04/19 2154  . magnesium hydroxide (MILK OF MAGNESIA) suspension 30 mL  30 mL Oral Daily PRN Gillermo Murdochhompson, Jacqueline, NP   30 mL at 10/03/19  2123  . oxybutynin (DITROPAN) tablet 5 mg  5 mg Oral Daily Gillermo Murdochhompson, Jacqueline, NP   5 mg at 10/05/19 0826  . prazosin (MINIPRESS) capsule 4 mg  4 mg Oral QHS Malvin JohnsFarah, Samaia Iwata, MD      . risperiDONE (RISPERDAL) tablet 2 mg  2 mg Oral BID Malvin JohnsFarah, Ozzie Remmers, MD      . valACYclovir Ralph Dowdy(VALTREX) tablet 1,000 mg  1,000 mg Oral Daily Gillermo Murdochhompson, Jacqueline, NP   1,000 mg at 10/05/19 0827  . venlafaxine XR (EFFEXOR-XR) 24 hr capsule 225 mg  225 mg Oral Daily Gillermo Murdochhompson, Jacqueline, NP   225 mg at 10/05/19 0826  . [START ON 10/08/2019] Vitamin D (Ergocalciferol) (DRISDOL) capsule 50,000 Units  50,000 Units Oral Q Devin Goingue Thompson, Jacqueline, NP        Lab Results: No results found for this or any previous visit (from the past 48 hour(s)).  Blood Alcohol level:  Lab Results  Component Value Date   ETH <10 09/30/2019   ETH <10 07/16/2019    Metabolic Disorder Labs: Lab Results  Component Value Date   HGBA1C 5.3 03/09/2016   Lab Results  Component Value Date   PROLACTIN 26.3 (H) 03/09/2016   Lab Results  Component Value Date   CHOL 175 03/09/2016   TRIG 88 03/09/2016   HDL 56 03/09/2016   CHOLHDL 3.1 03/09/2016   VLDL 18 03/09/2016   LDLCALC 101 (H) 03/09/2016   LDLCALC 103 (H) 08/08/2014    Physical Findings: AIMS: Facial and Oral Movements Muscles of Facial Expression: None, normal Lips and Perioral Area: None, normal Jaw: None, normal Tongue: None, normal,Extremity Movements Upper (arms, wrists, hands, fingers): None, normal Lower (legs, knees, ankles, toes): None, normal, Trunk Movements Neck, shoulders, hips: None, normal, Overall Severity Severity of abnormal movements (highest score from questions above): None, normal Incapacitation due to abnormal movements: None, normal Patient's awareness of abnormal movements (rate only patient's report): No Awareness, Dental Status Current problems with teeth and/or dentures?: No Does patient usually wear dentures?: No  CIWA:  CIWA-Ar Total:  5 COWS:  COWS Total Score: 3  Musculoskeletal: Strength & Muscle Tone: within normal limits Gait & Station: normal Patient leans: N/A  Psychiatric Specialty Exam: Physical Exam  ROS  Blood pressure 130/72, pulse 90, temperature 98.9 F (37.2 C), temperature source Oral, resp. rate 16, height 5\' 5"  (1.651 m), weight 81.6 kg, last menstrual period 09/17/2019, SpO2 100 %.Body mass index is 29.95 kg/m.  General Appearance: Casual  Eye Contact:  Fair  Speech:  Clear and Coherent  Volume:  Normal  Mood:  Euthymic  Affect:  Restricted  Thought Process:  Linear and Descriptions of Associations: Circumstantial  Orientation:  Full (Time, Place, and Person)  Thought Content:  Paranoid Ideation and Tangential  Suicidal Thoughts:  No  Homicidal Thoughts:  No  Memory:  Immediate;   Fair Recent;   Fair Remote;   Fair  Judgement:  Fair  Insight:  Good  Psychomotor Activity:  Normal  Concentration:  Concentration: Fair and Attention Span: Fair  Recall:  [employer] of Knowledge:  Fair  Language:  Rambles  Akathisia:  Negative  Handed:  Right  AIMS (if indicated):     Assets:  Leisure Time Physical Health Resilience Social Support  ADL's:  Intact  Cognition:  WNL  Sleep:  Number of Hours: 7.25     Treatment Plan Summary: Daily contact with patient to assess and evaluate symptoms and progress in treatment and Medication management  Continue current precautions 15-minute checks replace oral aripiprazole with Risperdal at her request for better anger management, add low-dose benztropine escalate prazosin 2 help with nightmares no change in other medications.  Malvin Johns, MD 10/05/2019, 9:11 AM

## 2019-10-06 MED ORDER — BENZTROPINE MESYLATE 1 MG PO TABS
1.0000 mg | ORAL_TABLET | Freq: Two times a day (BID) | ORAL | Status: DC
  Administered 2019-10-06 – 2019-10-07 (×2): 1 mg via ORAL
  Filled 2019-10-06 (×2): qty 1

## 2019-10-06 MED ORDER — RISPERIDONE 1 MG PO TABS
2.0000 mg | ORAL_TABLET | Freq: Every day | ORAL | Status: DC
  Administered 2019-10-07: 2 mg via ORAL
  Filled 2019-10-06: qty 2

## 2019-10-06 MED ORDER — RISPERIDONE 1 MG PO TABS
4.0000 mg | ORAL_TABLET | Freq: Every day | ORAL | Status: DC
  Administered 2019-10-06 – 2019-10-10 (×5): 4 mg via ORAL
  Filled 2019-10-06 (×5): qty 4

## 2019-10-06 NOTE — Progress Notes (Signed)
Patient just came to get this writer to show that her hair is falling out. When this writer went into patient's bathroom, the amount of hair that was on the sink appears to be normal shedding. There was no large amounts of hair in patient's sink, what she has portrayed that it was.

## 2019-10-06 NOTE — [facility] Group Notes (Signed)
LCSW Group Therapy Note 10/06/2019 1:15pm  Type of Therapy and Topic: Group Therapy: Feelings Around Returning Home & Establishing a Supportive Framework and Supporting Oneself When Supports Not Available  Participation Level: Active  Description of Group:  Patients first processed thoughts and feelings about upcoming discharge. These included fears of upcoming changes, lack of change, new living environments, judgements and expectations from others and overall stigma of mental health issues. The group then discussed the definition of a supportive framework, what that looks and feels like, and how do to discern it from an unhealthy non-supportive network. The group identified different types of supports as well as what to do when your family/friends are less than helpful or unavailable  Therapeutic Goals  1. Patient will identify one healthy supportive network that they can use at discharge. 2. Patient will identify one factor of a supportive framework and how to tell it from an unhealthy network. 3. Patient able to identify one coping skill to use when they do not have positive supports from others. 4. Patient will demonstrate ability to communicate their needs through discussion and/or role plays.  Summary of Patient Progress:  Patient reported she feels "anxious." Pt engaged during group session. As patients processed their anxiety about discharge and described healthy supports patient shared she is ready to be discharge.  Patients identified at least one self-care tool they were willing to use after discharge.   Therapeutic Modalities Cognitive Behavioral Therapy Motivational Interviewing   Cheree Ditto, LCSW 10/06/2019 12:38 PM

## 2019-10-06 NOTE — Plan of Care (Signed)
D- Patient alert and oriented. Patient presented in a pleasant mood on assessment stating that she slept good last night and her only complaint was of arthritic pain in bilateral arms, rating it a "9/10". Patient requested pain medication from this writer. Patient denied any depression, however, she stated that she's anxious "about this place". Patient also denied SI, HI, AVH, at this time. Patient's goal for today is "finding coping mechanisms for depression, anxiety, and impulsive decisions", in which she will "try to talk to a therapist and go to group" in order to accomplish her goal.  A- Scheduled medications administered to patient, per MD orders. Support and encouragement provided.  Routine safety checks conducted every 15 minutes.  Patient informed to notify staff with problems or concerns.  R- No adverse drug reactions noted. Patient contracts for safety at this time. Patient compliant with medications and treatment plan. Patient receptive, calm, and cooperative. Patient interacts well with others on the unit.  Patient remains safe at this time.  Problem: Education: Goal: Ability to make informed decisions regarding treatment will improve Outcome: Progressing   Problem: Coping: Goal: Coping ability will improve Outcome: Progressing   Problem: Health Behavior/Discharge Planning: Goal: Identification of resources available to assist in meeting health care needs will improve Outcome: Progressing   Problem: Medication: Goal: Compliance with prescribed medication regimen will improve Outcome: Progressing   Problem: Self-Concept: Goal: Ability to disclose and discuss suicidal ideas will improve Outcome: Progressing Goal: Will verbalize positive feelings about self Outcome: Progressing   Problem: Education: Goal: Utilization of techniques to improve thought processes will improve Outcome: Progressing Goal: Knowledge of the prescribed therapeutic regimen will improve Outcome:  Progressing   Problem: Activity: Goal: Interest or engagement in leisure activities will improve Outcome: Progressing Goal: Imbalance in normal sleep/wake cycle will improve Outcome: Progressing   Problem: Coping: Goal: Coping ability will improve Outcome: Progressing Goal: Will verbalize feelings Outcome: Progressing   Problem: Health Behavior/Discharge Planning: Goal: Ability to make decisions will improve Outcome: Progressing Goal: Compliance with therapeutic regimen will improve Outcome: Progressing   Problem: Role Relationship: Goal: Will demonstrate positive changes in social behaviors and relationships Outcome: Progressing   Problem: Safety: Goal: Ability to disclose and discuss suicidal ideas will improve Outcome: Progressing Goal: Ability to identify and utilize support systems that promote safety will improve Outcome: Progressing   Problem: Self-Concept: Goal: Will verbalize positive feelings about self Outcome: Progressing Goal: Level of anxiety will decrease Outcome: Progressing   Problem: Education: Goal: Knowledge of disease or condition will improve Outcome: Progressing Goal: Understanding of discharge needs will improve Outcome: Progressing   Problem: Health Behavior/Discharge Planning: Goal: Ability to identify changes in lifestyle to reduce recurrence of condition will improve Outcome: Progressing Goal: Identification of resources available to assist in meeting health care needs will improve Outcome: Progressing   Problem: Physical Regulation: Goal: Complications related to the disease process, condition or treatment will be avoided or minimized Outcome: Progressing   Problem: Safety: Goal: Ability to remain free from injury will improve Outcome: Progressing   Problem: Activity: Goal: Will identify at least one activity in which they can participate Outcome: Progressing   Problem: Coping: Goal: Ability to identify and develop effective  coping behavior will improve Outcome: Progressing Goal: Ability to interact with others will improve Outcome: Progressing Goal: Demonstration of participation in decision-making regarding own care will improve Outcome: Progressing Goal: Ability to use eye contact when communicating with others will improve Outcome: Progressing   Problem: Health Behavior/Discharge Planning: Goal: Identification  of resources available to assist in meeting health care needs will improve Outcome: Progressing   Problem: Self-Concept: Goal: Will verbalize positive feelings about self Outcome: Progressing

## 2019-10-06 NOTE — Progress Notes (Signed)
This Probation officer was informed by one of the nursing students that patient stated that she is a little more depressed today because she doesn't know where she will go after discharge. Patient also mentioned to the nursing student that she is fidgety and she noticed that some of her hair is falling out.

## 2019-10-06 NOTE — Progress Notes (Signed)
[facility]  10/06/2019 9:14 AM Krista [name]  MRN:  [id number] Subjective:    Krista [name] is a 38 year old patient on long-acting injectable aripiprazole but also still suffering from a schizophrenic relapse, she was placed on Risperdal yesterday because she stated it "helped in the past" and she reports some improvement in her cognition think she is "better" but is reporting a mild tremor that is parkinsonian type of the hands again it is there but very mild.  Alert and oriented to person place situation without thoughts of harming self or others without auditory or visual hallucinations.  Still makes some disjointed but no delusional statements.  No tardive dyskinesia noted  Principal Problem: Schizophrenia (HCC) Diagnosis: Principal Problem:   Schizophrenia (HCC) Active Problems:   Intellectual disability   Agitation  Total Time spent with patient: 20 minutes  Past Psychiatric History: See history  Past Medical History:  Past Medical History:  Diagnosis Date  . Anxiety   . Depression   . HSV infection   . Mental retardation   . Schizophrenia [facility](HCC)     Past Surgical History:  Procedure Laterality Date  . CESAREAN SECTION     Family History:  Family History  Problem Relation Age of Onset  . Cancer Neg Hx    Family Psychiatric  History: No new data Social History:  Social History   Substance and Sexual Activity  Alcohol Use No     Social History   Substance and Sexual Activity  Drug Use No    Social History   Socioeconomic History  . Marital status: Significant Other    Spouse name: Not on file  . Number of children: Not on file  . Years of education: Not on file  . Highest education level: Not on file  Occupational History  . Not on file  Social Needs  . Financial resource strain: Not on file  . Food insecurity    Worry: Not on file    Inability: Not on file  . Transportation needs    Medical: Not on file    Non-medical: Not on file   Tobacco Use  . Smoking status: Never Smoker  . Smokeless tobacco: Never Used  Substance and Sexual Activity  . Alcohol use: No  . Drug use: No  . Sexual activity: Yes    Birth control/protection: I.U.D., Condom  Lifestyle  . Physical activity    Days per week: Not on file    Minutes per session: Not on file  . Stress: Not on file  Relationships  . Social Musicianconnections    Talks on phone: Not on file    Gets together: Not on file    Attends religious service: Not on file    Active member of club or organization: Not on file    Attends meetings of clubs or organizations: Not on file    Relationship status: Not on file  Other Topics Concern  . Not on file  Social History Narrative  . Not on file   Additional Social History:                         Sleep: Good  Appetite:  Good  Current Medications: Current Facility-Administered Medications  Medication Dose Route Frequency Provider Last Rate Last Dose  . acetaminophen (TYLENOL) tablet 650 mg  650 mg Oral Q6H PRN Gillermo Murdochhompson, Jacqueline, NP   650 mg at 10/06/19 0825  . alum & mag hydroxide-simeth (MAALOX/MYLANTA) 200-200-20 MG/5ML suspension  30 mL  30 mL Oral Q4H PRN Caroline Sauger, NP      . Derrill Memo ON 10/16/2019] ARIPiprazole ER (ABILIFY MAINTENA) 400 MG prefilled syringe 400 mg  400 mg Intramuscular Q21 days Money, Lowry Ram, [place]      . benztropine (COGENTIN) tablet 0.5 mg  0.5 mg Oral BID Johnn Hai, MD   0.5 mg at 10/06/19 0825  . divalproex (DEPAKOTE) DR tablet 500 mg  500 mg Oral TID Caroline Sauger, NP   500 mg at 10/06/19 0825  . hydrOXYzine (ATARAX/VISTARIL) tablet 50 mg  50 mg Oral TID PRN Money, Lowry Ram, FNP   50 mg at 10/05/19 2116  . magnesium hydroxide (MILK OF MAGNESIA) suspension 30 mL  30 mL Oral Daily PRN Caroline Sauger, NP   30 mL at 10/03/19 2123  . oxybutynin (DITROPAN) tablet 5 mg  5 mg Oral Daily Caroline Sauger, NP   5 mg at 10/06/19 0825  . prazosin (MINIPRESS) capsule 4 mg   4 mg Oral QHS Johnn Hai, MD   4 mg at 10/05/19 2116  . risperiDONE (RISPERDAL) tablet 2 mg  2 mg Oral BID Johnn Hai, MD   2 mg at 10/06/19 0825  . valACYclovir (VALTREX) tablet 1,000 mg  1,000 mg Oral Daily Caroline Sauger, NP   1,000 mg at 10/06/19 0825  . venlafaxine XR (EFFEXOR-XR) 24 hr capsule 225 mg  225 mg Oral Daily Caroline Sauger, NP   225 mg at 10/06/19 0825  . [START ON 10/08/2019] Vitamin D (Ergocalciferol) (DRISDOL) capsule 50,000 Units  50,000 Units Oral Q Ivar Drape, NP        Lab Results:  Results for orders placed or performed during the hospital encounter of 10/02/19 (from the past 48 hour(s))  Valproic acid level     Status: None   Collection Time: 10/05/19  9:02 AM  Result Value Ref Range   Valproic Acid Lvl 80 50.0 - 100.0 ug/mL    Comment: Performed at [facility], [address] [place], [place] [address]    Blood Alcohol level:  Lab Results  Component Value Date   [facility] <10 09/30/2019   ETH <10 76/22/6333    Metabolic Disorder Labs: Lab Results  Component Value Date   HGBA1C 5.3 03/09/2016   Lab Results  Component Value Date   PROLACTIN 26.3 (H) 03/09/2016   Lab Results  Component Value Date   CHOL 175 03/09/2016   TRIG 88 03/09/2016   HDL 56 03/09/2016   CHOLHDL 3.1 03/09/2016   VLDL 18 03/09/2016   LDLCALC 101 (H) 03/09/2016   LDLCALC 103 (H) 08/08/2014    Physical Findings: AIMS: Facial and Oral Movements Muscles of Facial Expression: None, normal Lips and Perioral Area: None, normal Jaw: None, normal Tongue: None, normal,Extremity Movements Upper (arms, wrists, hands, fingers): None, normal Lower (legs, knees, ankles, toes): None, normal, Trunk Movements Neck, shoulders, hips: None, normal, Overall Severity Severity of abnormal movements (highest score from questions above): None, normal Incapacitation due to abnormal movements: None, normal Patient's awareness of abnormal movements (rate only  patient's report): No Awareness, Dental Status Current problems with teeth and/or dentures?: No Does patient usually wear dentures?: No  CIWA:  CIWA-Ar Total: 5 COWS:  COWS Total Score: 3  Musculoskeletal: Strength & Muscle Tone: within normal limits Gait & Station: normal Patient leans: N/A  Psychiatric Specialty Exam: Physical Exam  ROS  Blood pressure (!) 117/93, pulse (!) 103, temperature 98.2 F (36.8 C), temperature source Oral, resp. rate  18, height 5\' 5"  (1.651 m), weight 81.6 kg, last menstrual period 09/17/2019, SpO2 100 %.Body mass index is 29.95 kg/m.  General Appearance: Casual  Eye Contact:  Fair  Speech:  Normal Rate  Volume:  Decreased  Mood:  Dysphoric  Affect:  Restricted  Thought Process:  Coherent, Linear and Descriptions of Associations: Circumstantial  Orientation:  Full (Time, Place, and Person)  Thought Content:  Logical  Suicidal Thoughts:  No  Homicidal Thoughts:  No  Memory:  Immediate;   Fair Recent;   Fair Remote;   Fair  Judgement:  Good  Insight:  Fair  Psychomotor Activity:  Normal/mild parkinsonian type tremor of the hands bilaterally  Concentration:  Concentration: Fair and Attention Span: Fair  Recall:  09/19/2019 of Knowledge:  Fair  Language:  Fair  Akathisia:  Negative  Handed:  Right  AIMS (if indicated):     Assets:  Physical Health Resilience  ADL's:  Intact  Cognition:  WNL  Sleep:  Number of Hours: 7.75     Treatment Plan Summary: Daily contact with patient to assess and evaluate symptoms and progress in treatment and Medication management  We will go ahead and escalate the Risperdal continue but escalate Cogentin continue reality based therapy no change in precautions reality based therapy to continue/probable discharge later in the week  Ray Gervasi, MD 10/06/2019, 9:14 AM

## 2019-10-07 MED ORDER — BENZTROPINE MESYLATE 1 MG PO TABS: 0.5000 mg | ORAL_TABLET | Freq: Two times a day (BID) | ORAL | Status: DC

## 2019-10-07 MED ORDER — BENZTROPINE MESYLATE 1 MG PO TABS
0.5000 mg | ORAL_TABLET | Freq: Every day | ORAL | Status: DC
  Administered 2019-10-07 – 2019-10-10 (×4): 0.5 mg via ORAL
  Filled 2019-10-07 (×4): qty 1

## 2019-10-07 MED ORDER — HYDROCORTISONE 0.5 % EX CREA
TOPICAL_CREAM | Freq: Two times a day (BID) | CUTANEOUS | Status: DC | PRN
  Administered 2019-10-08: 19:00:00 via TOPICAL
  Filled 2019-10-07: qty 28.35

## 2019-10-07 MED ORDER — IBUPROFEN 200 MG PO TABS
400.0000 mg | ORAL_TABLET | Freq: Four times a day (QID) | ORAL | Status: DC | PRN
  Administered 2019-10-07 – 2019-10-11 (×3): 400 mg via ORAL
  Filled 2019-10-07 (×3): qty 2

## 2019-10-07 MED ORDER — MENTHOL 3 MG MT LOZG
1.0000 | LOZENGE | OROMUCOSAL | Status: DC | PRN
  Filled 2019-10-07: qty 9

## 2019-10-07 NOTE — Progress Notes (Signed)
CSW contacted Mayer Camel with [employer] to see if pt has a care coordinator. Per Jolyn Lent, pt has a CST team with [facility] [phone]. Chervonne reported CST team needs to find pt another placement and if they need assistance or think pt needs a higher level of care then they could contact her.  CSW contacted [facility] and left a message for CST team to give CSW a call back.  Evalina Field, MSW, LCSW Clinical Social Work 10/07/2019 10:51 AM

## 2019-10-07 NOTE — Plan of Care (Signed)
D- Patient alert and oriented. Patient presents in a pleasant mood on assessment stating that she slept ok last night and the only complaint she had was of arm pain. Patient rated her pain a "9/10", in which she requested medication from this Probation officer. Patient endorsed anxiety, rating it a "10/10", reporting that her "different types of illnesses" are making her feel this way. Patient denies depression, stating "I was depressed yesterday, but today I'm feeling a lot better". Patient also denies SI, HI, AVH, at this time. Patient's goal for today is "to stay out of room", in which she has been present in the milieu majority of the day without any issues.  A- Scheduled medications administered to patient, per MD orders. Support and encouragement provided.  Routine safety checks conducted every 15 minutes.  Patient informed to notify staff with problems or concerns.  R- No adverse drug reactions noted. Patient contracts for safety at this time. Patient compliant with medications and treatment plan. Patient receptive, calm, and cooperative. Patient interacts well with others on the unit.  Patient remains safe at this time.  Problem: Education: Goal: Ability to make informed decisions regarding treatment will improve Outcome: Progressing   Problem: Coping: Goal: Coping ability will improve Outcome: Progressing   Problem: Health Behavior/Discharge Planning: Goal: Identification of resources available to assist in meeting health care needs will improve Outcome: Progressing   Problem: Medication: Goal: Compliance with prescribed medication regimen will improve Outcome: Progressing   Problem: Self-Concept: Goal: Ability to disclose and discuss suicidal ideas will improve Outcome: Progressing Goal: Will verbalize positive feelings about self Outcome: Progressing   Problem: Education: Goal: Utilization of techniques to improve thought processes will improve Outcome: Progressing Goal: Knowledge of  the prescribed therapeutic regimen will improve Outcome: Progressing   Problem: Activity: Goal: Interest or engagement in leisure activities will improve Outcome: Progressing Goal: Imbalance in normal sleep/wake cycle will improve Outcome: Progressing   Problem: Coping: Goal: Coping ability will improve Outcome: Progressing Goal: Will verbalize feelings Outcome: Progressing   Problem: Health Behavior/Discharge Planning: Goal: Ability to make decisions will improve Outcome: Progressing Goal: Compliance with therapeutic regimen will improve Outcome: Progressing   Problem: Role Relationship: Goal: Will demonstrate positive changes in social behaviors and relationships Outcome: Progressing   Problem: Safety: Goal: Ability to disclose and discuss suicidal ideas will improve Outcome: Progressing Goal: Ability to identify and utilize support systems that promote safety will improve Outcome: Progressing   Problem: Self-Concept: Goal: Will verbalize positive feelings about self Outcome: Progressing Goal: Level of anxiety will decrease Outcome: Progressing   Problem: Education: Goal: Knowledge of disease or condition will improve Outcome: Progressing Goal: Understanding of discharge needs will improve Outcome: Progressing   Problem: Health Behavior/Discharge Planning: Goal: Ability to identify changes in lifestyle to reduce recurrence of condition will improve Outcome: Progressing Goal: Identification of resources available to assist in meeting health care needs will improve Outcome: Progressing   Problem: Physical Regulation: Goal: Complications related to the disease process, condition or treatment will be avoided or minimized Outcome: Progressing   Problem: Safety: Goal: Ability to remain free from injury will improve Outcome: Progressing   Problem: Activity: Goal: Will identify at least one activity in which they can participate Outcome: Progressing   Problem:  Coping: Goal: Ability to identify and develop effective coping behavior will improve Outcome: Progressing Goal: Ability to interact with others will improve Outcome: Progressing Goal: Demonstration of participation in decision-making regarding own care will improve Outcome: Progressing Goal: Ability to use eye contact  when communicating with others will improve Outcome: Progressing   Problem: Health Behavior/Discharge Planning: Goal: Identification of resources available to assist in meeting health care needs will improve Outcome: Progressing   Problem: Self-Concept: Goal: Will verbalize positive feelings about self Outcome: Progressing

## 2019-10-07 NOTE — Progress Notes (Signed)
Recreation Therapy Notes          Krista [name] 10/07/2019 11:24 AM

## 2019-10-07 NOTE — B[facility] Counselor (Signed)
CSW attempted to call Legal guardian Solmon Ice - [phone] to assess for progress in locating housing for the patient.  CSW was unable to speak with Chavis and left a HIPAA compliant voicemail.   Assunta Curtis, MSW, LCSW 10/07/2019 1:30 PM

## 2019-10-07 NOTE — Progress Notes (Signed)
D: Patient has been calm and cooperative. Patient's thoughts are slightly disorganized. Complains of difficulty remembering things. Denies SI, HI and AVH.  A: Continue to monitor for safety R: Safety maintained.

## 2019-10-07 NOTE — B[facility] Counselor (Signed)
CSW left confidential vm for [facility] home owner) to inquire whether pt has been accepted or declined for placement, awaiting call back.

## 2019-10-07 NOTE — Plan of Care (Signed)
  Problem: Education: Goal: Ability to make informed decisions regarding treatment will improve Outcome: Not Progressing  D: Patient has been calm and cooperative. Patient's thoughts are slightly disorganized. Complains of difficulty remembering things. Denies SI, HI and AVH.  A: Continue to monitor for safety R: Safety maintained.

## 2019-10-07 NOTE — [facility] Group Notes (Signed)
Overcoming Obstacles  10/07/2019 1PM  Type of Therapy and Topic:  Group Therapy:  Overcoming Obstacles  Participation Level:  Active    Description of Group:    In this group patients will be encouraged to explore what they see as obstacles to their own wellness and recovery. They will be guided to discuss their thoughts, feelings, and behaviors related to these obstacles. The group will process together ways to cope with barriers, with attention given to specific choices patients can make. Each patient will be challenged to identify changes they are motivated to make in order to overcome their obstacles. This group will be process-oriented, with patients participating in exploration of their own experiences as well as giving and receiving support and challenge from other group members.   Therapeutic Goals: 1. Patient will identify personal and current obstacles as they relate to admission. 2. Patient will identify barriers that currently interfere with their wellness or overcoming obstacles.  3. Patient will identify feelings, thought process and behaviors related to these barriers. 4. Patient will identify two changes they are willing to make to overcome these obstacles:      Summary of Patient Progress Actively and appropriately participated in todays session. Pt identified her legal guardian and parents as a apart of her support system and one of her protective factors. Pt reports she would like to "stop lashing out on others" as an obstacle she would like to overcome. Pt demonstrated good insight and was receptive to feedback from group members.    Therapeutic Modalities:   Cognitive Behavioral Therapy Solution Focused Therapy Motivational Interviewing Relapse Prevention Therapy    Sanjuana Kava, MSW, LCSW 10/07/2019 2:32 PM

## 2019-10-07 NOTE — Progress Notes (Signed)
CSW called [facility] group home to follow up on potential bed placement for pt. CSW left a message for the QP to call back regarding possible placement for pt.   Evalina Field, MSW, LCSW Clinical Social Work 10/07/2019 10:08 AM

## 2019-10-07 NOTE — Progress Notes (Signed)
[facility]  10/07/2019 1:14 PM Krista [name]  MRN:  [id number] Subjective: Follow-up for this woman with a history of schizophrenia and developmental disability.  Patient seen chart reviewed.  Over the weekend her medicines were changed and that she was taken off of oral Abilify and started on Risperdal.  Patient is quite pleased that she has not lost her temper since being in the hospital.  She seems calm but is also complaining of a lot of dry mouth which could be due to the Cogentin and Risperdal.  Also seems to be slightly akathetic although she does not report feeling it necessarily.  She is lucid in her conversation no evidence of acute delusions denies any thoughts of violence or suicide. Principal Problem: Schizophrenia (HCC) Diagnosis: Principal Problem:   Schizophrenia (HCC) Active Problems:   Intellectual disability   Agitation  Total Time spent with patient: 30 minutes  Past Psychiatric History: Longstanding history of behavior and mental health problems with multiple hospitalizations and difficulty placing her because of outbursts.  Past Medical History:  Past Medical History:  Diagnosis Date  . Anxiety   . Depression   . HSV infection   . Mental retardation   . Schizophrenia [facility](HCC)     Past Surgical History:  Procedure Laterality Date  . CESAREAN SECTION     Family History:  Family History  Problem Relation Age of Onset  . Cancer Neg Hx    Family Psychiatric  History: See previous Social History:  Social History   Substance and Sexual Activity  Alcohol Use No     Social History   Substance and Sexual Activity  Drug Use No    Social History   Socioeconomic History  . Marital status: Significant Other    Spouse name: Not on file  . Number of children: Not on file  . Years of education: Not on file  . Highest education level: Not on file  Occupational History  . Not on file  Social Needs  . Financial resource strain: Not on file  . Food  insecurity    Worry: Not on file    Inability: Not on file  . Transportation needs    Medical: Not on file    Non-medical: Not on file  Tobacco Use  . Smoking status: Never Smoker  . Smokeless tobacco: Never Used  Substance and Sexual Activity  . Alcohol use: No  . Drug use: No  . Sexual activity: Yes    Birth control/protection: I.U.D., Condom  Lifestyle  . Physical activity    Days per week: Not on file    Minutes per session: Not on file  . Stress: Not on file  Relationships  . Social Musicianconnections    Talks on phone: Not on file    Gets together: Not on file    Attends religious service: Not on file    Active member of club or organization: Not on file    Attends meetings of clubs or organizations: Not on file    Relationship status: Not on file  Other Topics Concern  . Not on file  Social History Narrative  . Not on file   Additional Social History:                         Sleep: Fair  Appetite:  Fair  Current Medications: Current Facility-Administered Medications  Medication Dose Route Frequency Provider Last Rate Last Dose  . acetaminophen (TYLENOL) tablet 650 mg  650 mg Oral Q6H PRN Gillermo Murdoch, NP   650 mg at 10/07/19 0811  . alum & mag hydroxide-simeth (MAALOX/MYLANTA) 200-200-20 MG/5ML suspension 30 mL  30 mL Oral Q4H PRN Gillermo Murdoch, NP      . Melene Muller ON 10/16/2019] ARIPiprazole ER (ABILIFY MAINTENA) 400 MG prefilled syringe 400 mg  400 mg Intramuscular Q21 days Money, Gerlene Burdock, FNP      . benztropine (COGENTIN) tablet 0.5 mg  0.5 mg Oral QHS Galena Logie T, MD      . divalproex (DEPAKOTE) DR tablet 500 mg  500 mg Oral TID Gillermo Murdoch, NP   500 mg at 10/07/19 1127  . hydrOXYzine (ATARAX/VISTARIL) tablet 50 mg  50 mg Oral TID PRN Money, Gerlene Burdock, FNP   50 mg at 10/05/19 2116  . magnesium hydroxide (MILK OF MAGNESIA) suspension 30 mL  30 mL Oral Daily PRN Gillermo Murdoch, NP   30 mL at 10/03/19 2123  . oxybutynin  (DITROPAN) tablet 5 mg  5 mg Oral Daily Gillermo Murdoch, NP   5 mg at 10/07/19 0810  . prazosin (MINIPRESS) capsule 4 mg  4 mg Oral QHS Malvin Johns, MD   4 mg at 10/06/19 2124  . risperiDONE (RISPERDAL) tablet 4 mg  4 mg Oral QHS Malvin Johns, MD   4 mg at 10/06/19 2124  . valACYclovir (VALTREX) tablet 1,000 mg  1,000 mg Oral Daily Gillermo Murdoch, NP   1,000 mg at 10/07/19 0810  . venlafaxine XR (EFFEXOR-XR) 24 hr capsule 225 mg  225 mg Oral Daily Gillermo Murdoch, NP   225 mg at 10/07/19 0810  . [START ON 10/08/2019] Vitamin D (Ergocalciferol) (DRISDOL) capsule 50,000 Units  50,000 Units Oral Q Devin Going, NP        Lab Results: No results found for this or any previous visit (from the past 48 hour(s)).  Blood Alcohol level:  Lab Results  Component Value Date   ETH <10 09/30/2019   ETH <10 07/16/2019    Metabolic Disorder Labs: Lab Results  Component Value Date   HGBA1C 5.3 03/09/2016   Lab Results  Component Value Date   PROLACTIN 26.3 (H) 03/09/2016   Lab Results  Component Value Date   CHOL 175 03/09/2016   TRIG 88 03/09/2016   HDL 56 03/09/2016   CHOLHDL 3.1 03/09/2016   VLDL 18 03/09/2016   LDLCALC 101 (H) 03/09/2016   LDLCALC 103 (H) 08/08/2014    Physical Findings: AIMS: Facial and Oral Movements Muscles of Facial Expression: None, normal Lips and Perioral Area: None, normal Jaw: None, normal Tongue: None, normal,Extremity Movements Upper (arms, wrists, hands, fingers): None, normal Lower (legs, knees, ankles, toes): None, normal, Trunk Movements Neck, shoulders, hips: None, normal, Overall Severity Severity of abnormal movements (highest score from questions above): None, normal Incapacitation due to abnormal movements: None, normal Patient's awareness of abnormal movements (rate only patient's report): No Awareness, Dental Status Current problems with teeth and/or dentures?: No Does patient usually wear dentures?: No  CIWA:   CIWA-Ar Total: 5 COWS:  COWS Total Score: 3  Musculoskeletal: Strength & Muscle Tone: within normal limits Gait & Station: normal Patient leans: N/A  Psychiatric Specialty Exam: Physical Exam  Nursing note and vitals reviewed. Constitutional: She appears well-developed and well-nourished.  HENT:  Head: Normocephalic and atraumatic.  Eyes: Pupils are equal, round, and reactive to light. Conjunctivae are normal.  Neck: Normal range of motion.  Cardiovascular: Regular rhythm and normal heart sounds.  Respiratory: Effort normal. No respiratory  distress.  GI: Soft.  Musculoskeletal: Normal range of motion.  Neurological: She is alert.  Skin: Skin is warm and dry.  Psychiatric: She has a normal mood and affect. Her speech is normal and behavior is normal. Thought content normal. She expresses impulsivity. She exhibits abnormal recent memory.    Review of Systems  Constitutional: Negative.   HENT: Negative.   Eyes: Negative.   Respiratory: Negative.   Cardiovascular: Negative.   Gastrointestinal: Negative.   Musculoskeletal: Negative.   Skin: Negative.   Neurological: Negative.   Psychiatric/Behavioral: Negative for depression, hallucinations, memory loss, substance abuse and suicidal ideas. The patient is not nervous/anxious and does not have insomnia.     Blood pressure 129/90, pulse 97, temperature 98.6 F (37 C), temperature source Oral, resp. rate 16, height 5\' 5"  (1.651 m), weight 81.6 kg, last menstrual period 09/17/2019, SpO2 100 %.Body mass index is 29.95 kg/m.  General Appearance: Casual  Eye Contact:  Good  Speech:  Garbled  Volume:  Normal  Mood:  Euthymic  Affect:  Congruent  Thought Process:  Goal Directed  Orientation:  Full (Time, Place, and Person)  Thought Content:  Logical  Suicidal Thoughts:  No  Homicidal Thoughts:  No  Memory:  Immediate;   Fair Recent;   Fair Remote;   Fair  Judgement:  Fair  Insight:  Fair  Psychomotor Activity:  Restlessness   Concentration:  Concentration: Fair  Recall:  [employer] of Knowledge:  Fair  Language:  Fair  Akathisia:  Yes  Handed:  Right  AIMS (if indicated):     Assets:  Desire for Improvement Physical Health Resilience  ADL's:  Intact  Cognition:  Impaired,  Mild  Sleep:  Number of Hours: 7.25     Treatment Plan Summary: Daily contact with patient to assess and evaluate symptoms and progress in treatment, Medication management and Plan Patient continues to appear to be doing well in the hospital.  I did decide to cut back a little on the Risperdal by eliminating the morning dose and cutting the Cogentin down to just 0.5 with her evening medicine.  This is to help the dry mouth and akathisia.  Usually patient does not really present as psychotic so much as impulsive at times.  Right now she seems to be at her baseline and we are looking for some kind of appropriate discharge plan along with her guardian.  Repeated all this to the patient and explained the difficulties and empathized with her frustration.  Alethia Berthold, MD 10/07/2019, 1:14 PM

## 2019-10-08 MED ORDER — PHENOL 1.4 % MT LIQD
1.0000 | OROMUCOSAL | Status: DC | PRN
  Administered 2019-10-08: 17:00:00 1 via OROMUCOSAL
  Filled 2019-10-08: qty 177
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
## 2019-10-08 NOTE — B[facility] Counselor (Signed)
CSW left a confidential vm for Krista [name](pt legal guardian) informing him the pt had an interview with [facility] group [name] and they are willing to accept her but have questions regarding payer source. CSW provided Dub Mikes' contact [phone]).

## 2019-10-08 NOTE — Progress Notes (Signed)
Recreation Therapy Notes   Date: 10/08/2019  Time: 9:30 am  Location: Craft room  Behavioral response: Appropriate   Intervention Topic: Relaxation   Discussion/Intervention:  Group content today was focused on relaxation. The group defined relaxation and identified healthy ways to relax. Individuals expressed how much time they spend relaxing. Patients expressed how much their life would be if they did not make time for themselves to relax. The group stated ways they could improve their relaxation techniques in the future.  Individuals participated in the intervention "Time to Relax" where they had a chance to experience different relaxation techniques.  Clinical Observations/Feedback:  Patient came to group and defined relaxation as getting away from stressful events. She explained that listening to music and going to the beach relaxes her. Individual was social with peers and staff while participating in the intervention.    Tyeler Goedken LRT/CTRS         Jerrin Recore 10/08/2019 12:02 PM

## 2019-10-08 NOTE — B[facility] Counselor (Signed)
CSW received a vm from [facility]) declining referral due to pt aggressive behaviors.

## 2019-10-08 NOTE — B[facility] Counselor (Signed)
CSW contacted the following to identify possible placement:  [facility], [phone] No female beds.  [place], [phone] All female facility.  [facility],  Does have female beds. Requested that information be faxed to [phone].  CSW faxed the requested information.         Assunta Curtis, MSW, LCSW 10/08/2019 11:36 AM

## 2019-10-08 NOTE — Progress Notes (Signed)
CSW spoke with Dub Mikes who reported she is willing to take pt in her group home, but she will need to talk with pts guardian first in order to make sure payment source is available and paperwork is in order. If everything is in order, Dub Mikes will pick pt up tomorrow.
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
## 2019-10-08 NOTE — Progress Notes (Signed)
Patient is pleasant upon approach , seen patient in the common area with peers in socializing manner with out any issues, mood and affect is good , medication compliant and attending groups with good participation, patient denies any SI/HI/and AVH, support and encouragement is provided , and 15 minutes safety check is is maintained no distress.

## 2019-10-08 NOTE — B[facility] Counselor (Signed)
CSW attempted to contact the patient's guardian, Solmon Ice - [phone]. CSW left a HIPAA compliant voicemail.  Assunta Curtis, MSW, LCSW 10/08/2019 10:45 AM

## 2019-10-08 NOTE — Progress Notes (Signed)
[facility] MD Progress Note  10/08/2019 9:52 AM Krista [name]  MRN:  [id number] Subjective:    Patient overall more organized in thought and behavior, denies auditory or visual hallucinations hopes [facility] at [facility]" or some other group home type setting.  She denies thoughts of harming self or others, no EPS or TD certainly more coherent and goal-directed and improved since the weekend. Principal Problem: Schizophrenia ([name]) Diagnosis: Principal Problem:   Schizophrenia ([place]) Active Problems:   Intellectual disability   Agitation  Total Time spent with patient: 20 minutes  Past Psychiatric History: See eval  Past Medical History:  Past Medical History:  Diagnosis Date  . Anxiety   . Depression   . HSV infection   . Mental retardation   . Schizophrenia [facility])     Past Surgical History:  Procedure Laterality Date  . CESAREAN SECTION     Family History:  Family History  Problem Relation Age of Onset  . Cancer Neg Hx    Family Psychiatric  History: See eval Social History:  Social History   Substance and Sexual Activity  Alcohol Use No     Social History   Substance and Sexual Activity  Drug Use No    Social History   Socioeconomic History  . Marital status: Significant Other    Spouse name: Not on file  . Number of children: Not on file  . Years of education: Not on file  . Highest education level: Not on file  Occupational History  . Not on file  Social Needs  . Financial resource strain: Not on file  . Food insecurity    Worry: Not on file    Inability: Not on file  . Transportation needs    Medical: Not on file    Non-medical: Not on file  Tobacco Use  . Smoking status: Never Smoker  . Smokeless tobacco: Never Used  Substance and Sexual Activity  . Alcohol use: No  . Drug use: No  . Sexual activity: Yes    Birth control/protection: I.U.D., Condom  Lifestyle  . Physical activity    Days per week: Not on file    Minutes per  session: Not on file  . Stress: Not on file  Relationships  . Social Herbalist on phone: Not on file    Gets together: Not on file    Attends religious service: Not on file    Active member of club or organization: Not on file    Attends meetings of clubs or organizations: Not on file    Relationship status: Not on file  Other Topics Concern  . Not on file  Social History Narrative  . Not on file   Additional Social History:                         Sleep: Good  Appetite:  Good  Current Medications: Current Facility-Administered Medications  Medication Dose Route Frequency Provider Last Rate Last Dose  . acetaminophen (TYLENOL) tablet 650 mg  650 mg Oral Q6H PRN Caroline Sauger, NP   650 mg at 10/07/19 0811  . alum & mag hydroxide-simeth (MAALOX/MYLANTA) 200-200-20 MG/5ML suspension 30 mL  30 mL Oral Q4H PRN Caroline Sauger, NP      . Derrill Memo ON 10/16/2019] ARIPiprazole ER (ABILIFY MAINTENA) 400 MG prefilled syringe 400 mg  400 mg Intramuscular Q21 days Money, Lowry Ram, FNP      . benztropine (COGENTIN) tablet  0.5 mg  0.5 mg Oral QHS Clapacs, John T, MD   0.5 mg at 10/07/19 2114  . divalproex (DEPAKOTE) DR tablet 500 mg  500 mg Oral TID Gillermo Murdoch, NP   500 mg at 10/08/19 0825  . hydrocortisone cream 0.5 %   Topical BID PRN Clapacs, Jackquline Denmark, MD      . hydrOXYzine (ATARAX/VISTARIL) tablet 50 mg  50 mg Oral TID PRN Money, Gerlene Burdock, FNP   50 mg at 10/05/19 2116  . ibuprofen (ADVIL) tablet 400 mg  400 mg Oral Q6H PRN Clapacs, Jackquline Denmark, MD   400 mg at 10/07/19 2113  . magnesium hydroxide (MILK OF MAGNESIA) suspension 30 mL  30 mL Oral Daily PRN Gillermo Murdoch, NP   30 mL at 10/08/19 0828  . menthol-cetylpyridinium (CEPACOL) lozenge 3 mg  1 lozenge Oral PRN Clapacs, John T, MD      . oxybutynin (DITROPAN) tablet 5 mg  5 mg Oral Daily Gillermo Murdoch, NP   5 mg at 10/08/19 0825  . prazosin (MINIPRESS) capsule 4 mg  4 mg Oral QHS Malvin Johns, MD   4 mg at 10/07/19 2113  . risperiDONE (RISPERDAL) tablet 4 mg  4 mg Oral QHS Malvin Johns, MD   4 mg at 10/07/19 2113  . valACYclovir (VALTREX) tablet 1,000 mg  1,000 mg Oral Daily Gillermo Murdoch, NP   1,000 mg at 10/08/19 0826  . venlafaxine XR (EFFEXOR-XR) 24 hr capsule 225 mg  225 mg Oral Daily Gillermo Murdoch, NP   225 mg at 10/08/19 0824  . Vitamin D (Ergocalciferol) (DRISDOL) capsule 50,000 Units  50,000 Units Oral Q Devin Going, NP   50,000 Units at 10/08/19 3154    Lab Results: No results found for this or any previous visit (from the past 48 hour(s)).  Blood Alcohol level:  Lab Results  Component Value Date   ETH <10 09/30/2019   ETH <10 07/16/2019    Metabolic Disorder Labs: Lab Results  Component Value Date   HGBA1C 5.3 03/09/2016   Lab Results  Component Value Date   PROLACTIN 26.3 (H) 03/09/2016   Lab Results  Component Value Date   CHOL 175 03/09/2016   TRIG 88 03/09/2016   HDL 56 03/09/2016   CHOLHDL 3.1 03/09/2016   VLDL 18 03/09/2016   LDLCALC 101 (H) 03/09/2016   LDLCALC 103 (H) 08/08/2014    Physical Findings: AIMS: Facial and Oral Movements Muscles of Facial Expression: None, normal Lips and Perioral Area: None, normal Jaw: None, normal Tongue: None, normal,Extremity Movements Upper (arms, wrists, hands, fingers): None, normal Lower (legs, knees, ankles, toes): None, normal, Trunk Movements Neck, shoulders, hips: None, normal, Overall Severity Severity of abnormal movements (highest score from questions above): None, normal Incapacitation due to abnormal movements: None, normal Patient's awareness of abnormal movements (rate only patient's report): No Awareness, Dental Status Current problems with teeth and/or dentures?: No Does patient usually wear dentures?: No  CIWA:  CIWA-Ar Total: 5 COWS:  COWS Total Score: 3  Musculoskeletal: Strength & Muscle Tone: within normal limits Gait & Station: normal Patient  leans: N/A  Psychiatric Specialty Exam: Physical Exam  ROS  Blood pressure 126/73, pulse 97, temperature 97.9 F (36.6 C), temperature source Oral, resp. rate 19, height 5\' 5"  (1.651 m), weight 81.6 kg, last menstrual period 09/17/2019, SpO2 100 %.Body mass index is 29.95 kg/m.  General Appearance: Casual  Eye Contact:  Good  Speech:  Clear and Coherent  Volume:  Normal  Mood:  Euthymic  Affect:  Constricted  Thought Process:  Coherent, Goal Directed and Descriptions of Associations: Circumstantial  Orientation:  Full (Time, Place, and Person)  Thought Content:  Denies auditory or visual hallucinations denies thoughts of harming self no delusional content discerned  Suicidal Thoughts:  No  Homicidal Thoughts:  No  Memory:  Immediate;   Fair Recent;   Fair Remote;   Fair  Judgement:  Fair  Insight:  Fair  Psychomotor Activity:  Normal  Concentration:  Concentration: Fair and Attention Span: Fair  Recall:  FiservFair  Fund of Knowledge:  Fair  Language:  Fair  Akathisia:  Negative  Handed:  Right  AIMS (if indicated):     Assets:  Leisure Time Physical Health Resilience  ADL's:  Intact  Cognition:  WNL  Sleep:  Number of Hours: 8     Treatment Plan Summary: Daily contact with patient to assess and evaluate symptoms and progress in treatment and Medication management  No change in precautions no change in antipsychotic therapy continue to monitor on 15-minute checks discussed placement with social work  Malvin JohnsFARAH,Mariadejesus Cade, MD 10/08/2019, 9:52 AM

## 2019-10-08 NOTE — Plan of Care (Signed)
  Problem: Education: Goal: Ability to make informed decisions regarding treatment will improve Outcome: Progressing   Problem: Coping: Goal: Coping ability will improve Outcome: Progressing   Problem: Health Behavior/Discharge Planning: Goal: Identification of resources available to assist in meeting health care needs will improve Outcome: Progressing   Problem: Medication: Goal: Compliance with prescribed medication regimen will improve Outcome: Progressing   Problem: Self-Concept: Goal: Ability to disclose and discuss suicidal ideas will improve Outcome: Progressing Goal: Will verbalize positive feelings about self Outcome: Progressing   Problem: Education: Goal: Utilization of techniques to improve thought processes will improve Outcome: Progressing Goal: Knowledge of the prescribed therapeutic regimen will improve Outcome: Progressing   Problem: Activity: Goal: Interest or engagement in leisure activities will improve Outcome: Progressing Goal: Imbalance in normal sleep/wake cycle will improve Outcome: Progressing   Problem: Coping: Goal: Coping ability will improve Outcome: Progressing Goal: Will verbalize feelings Outcome: Progressing   Problem: Health Behavior/Discharge Planning: Goal: Ability to make decisions will improve Outcome: Progressing Goal: Compliance with therapeutic regimen will improve Outcome: Progressing   Problem: Role Relationship: Goal: Will demonstrate positive changes in social behaviors and relationships Outcome: Progressing   Problem: Safety: Goal: Ability to disclose and discuss suicidal ideas will improve Outcome: Progressing Goal: Ability to identify and utilize support systems that promote safety will improve Outcome: Progressing   Problem: Self-Concept: Goal: Will verbalize positive feelings about self Outcome: Progressing Goal: Level of anxiety will decrease Outcome: Progressing   Problem: Education: Goal: Knowledge of  disease or condition will improve Outcome: Progressing Goal: Understanding of discharge needs will improve Outcome: Progressing   Problem: Health Behavior/Discharge Planning: Goal: Ability to identify changes in lifestyle to reduce recurrence of condition will improve Outcome: Progressing Goal: Identification of resources available to assist in meeting health care needs will improve Outcome: Progressing   Problem: Physical Regulation: Goal: Complications related to the disease process, condition or treatment will be avoided or minimized Outcome: Progressing   Problem: Safety: Goal: Ability to remain free from injury will improve Outcome: Progressing   Problem: Activity: Goal: Will identify at least one activity in which they can participate Outcome: Progressing   Problem: Coping: Goal: Ability to identify and develop effective coping behavior will improve Outcome: Progressing Goal: Ability to interact with others will improve Outcome: Progressing Goal: Demonstration of participation in decision-making regarding own care will improve Outcome: Progressing Goal: Ability to use eye contact when communicating with others will improve Outcome: Progressing   Problem: Health Behavior/Discharge Planning: Goal: Identification of resources available to assist in meeting health care needs will improve Outcome: Progressing   Problem: Self-Concept: Goal: Will verbalize positive feelings about self Outcome: Progressing   

## 2019-10-08 NOTE — Plan of Care (Signed)
Patient is appropriate with staff & peers.Rated depression and anxiety 0/10.Patient talks more organized and goal oriented.Denies SI,HI and AVH.Compliant with medications.Attended groups.Appetite and energy level good.Support and encouragement given.

## 2019-10-08 NOTE — BHH Counselor (Signed)
CSW contacted the following group homes for placement:  [facility] [address]; [place], [place] [address] [address]; [place], [place] [address] [phone]; left confidential vm, awaiting response. [place]; [address]; [place], [place](referral coordinator [phone]); left confidential vm, awaiting response. [facility] (6) [place] [address]; [place], [place] [address] [phone]; left vm, awaiting response [facility] (6) [facility] [address]; [place], [place] [address] [phone]; left confidential vm, awaiting response [place] (6) [place]; [place], [place] [address] [phone]; no bed available [place] (6) [place]; [place], [place] [address] [phone]; left confidential vm, awaiting response [facility] (6) [facility] [address]; [place], [place] [address] [phone] bed available [place] (6) [place]; [place], [place] [address] [phone] msg, Luanna Cole Johnson(referral coordinator) will call back [place] (6) [facility] [address] [place]; [place], [place] [address] [phone], [phone]-no bed available

## 2019-10-08 NOTE — BHH Counselor (Signed)
CSW spoke with [place], [phone], [place] Team member.    She reports that she just gained authorization to work with the patient on 10/07/2019.  She reports that patient had authorization previously, however, it lapsed and they had to [place].    She reports that the patient was already looking at alternative placement prior to disruption at previous placement.   She questions if patient is appropriate for a 24 hour facility where patient are observed 24 hours a day and CSW encouraged [place] to discuss with doctor and pt's guardian.    She reports that she will look into possible placements and do some research.  Assunta Curtis, MSW, LCSW 10/08/2019 11:33 AM

## 2019-10-09 MED ORDER — TUBERCULIN PPD 5 UNIT/0.1ML ID SOLN
5.0000 [IU] | Freq: Once | INTRADERMAL | Status: AC
  Administered 2019-10-09: 13:00:00 5 [IU] via INTRADERMAL
  Filled 2019-10-09: qty 0.1

## 2019-10-09 MED ORDER — DOCUSATE SODIUM 100 MG PO CAPS
100.0000 mg | ORAL_CAPSULE | Freq: Every day | ORAL | Status: DC
  Administered 2019-10-09 – 2019-10-11 (×3): 100 mg via ORAL
  Filled 2019-10-09 (×3): qty 1

## 2019-10-09 NOTE — Progress Notes (Signed)
Pt med compliant. Denies SI/HI/AVH. Pt requested for medication for constipation, which was given. Q15 minutes safety checks maintained.

## 2019-10-09 NOTE — Progress Notes (Signed)
Recreation Therapy Notes  Date: 10/09/2019  Time: 9:30 am  Location: Craft room  Behavioral response: Appropriate   Intervention Topic: Stress   Discussion/Intervention:  Group content on today was focused on stress. The group defined stress and way to cope with stress. Participants expressed how they know when they are stresses out. Individuals described the different ways they have to cope with stress. The group stated reasons why it is important to cope with stress. Patient explained what good stress is and some examples. The group participated in the intervention "Exploring Stress". Individuals were separated into two group and answered questions related to stress.  Clinical Observations/Feedback:  Patient came to group and defined stress as to much to deal with at one time. She stated that she normally deals with stress by walking and overeating. Participant expressed that she does not handle stress well, but she could work on not being impulsive when stressed out. Individual was social with peers and staff while participating in the intervention.    Rockie Schnoor LRT/CTRS         Manika Hast 10/09/2019 12:02 PM

## 2019-10-09 NOTE — Plan of Care (Signed)
Pt denies depression, SI, HI and AVH. Pt rates anxiety 3/10 . Pt was educated on care plan and verbalizes understanding. Collier Bullock RN Problem: Education: Goal: Ability to make informed decisions regarding treatment will improve Outcome: Progressing   Problem: Coping: Goal: Coping ability will improve Outcome: Progressing   Problem: Health Behavior/Discharge Planning: Goal: Identification of resources available to assist in meeting health care needs will improve Outcome: Progressing   Problem: Medication: Goal: Compliance with prescribed medication regimen will improve Outcome: Progressing   Problem: Self-Concept: Goal: Ability to disclose and discuss suicidal ideas will improve Outcome: Progressing Goal: Will verbalize positive feelings about self Outcome: Progressing   Problem: Education: Goal: Utilization of techniques to improve thought processes will improve Outcome: Progressing Goal: Knowledge of the prescribed therapeutic regimen will improve Outcome: Progressing   Problem: Activity: Goal: Interest or engagement in leisure activities will improve Outcome: Progressing Goal: Imbalance in normal sleep/wake cycle will improve Outcome: Progressing   Problem: Coping: Goal: Coping ability will improve Outcome: Progressing Goal: Will verbalize feelings Outcome: Progressing   Problem: Health Behavior/Discharge Planning: Goal: Ability to make decisions will improve Outcome: Progressing Goal: Compliance with therapeutic regimen will improve Outcome: Progressing   Problem: Role Relationship: Goal: Will demonstrate positive changes in social behaviors and relationships Outcome: Progressing   Problem: Safety: Goal: Ability to disclose and discuss suicidal ideas will improve Outcome: Progressing Goal: Ability to identify and utilize support systems that promote safety will improve Outcome: Progressing   Problem: Self-Concept: Goal: Will verbalize positive feelings  about self Outcome: Progressing Goal: Level of anxiety will decrease Outcome: Progressing   Problem: Education: Goal: Knowledge of disease or condition will improve Outcome: Progressing Goal: Understanding of discharge needs will improve Outcome: Progressing   Problem: Health Behavior/Discharge Planning: Goal: Ability to identify changes in lifestyle to reduce recurrence of condition will improve Outcome: Progressing Goal: Identification of resources available to assist in meeting health care needs will improve Outcome: Progressing   Problem: Physical Regulation: Goal: Complications related to the disease process, condition or treatment will be avoided or minimized Outcome: Progressing   Problem: Safety: Goal: Ability to remain free from injury will improve Outcome: Progressing   Problem: Activity: Goal: Will identify at least one activity in which they can participate Outcome: Progressing   Problem: Coping: Goal: Ability to identify and develop effective coping behavior will improve Outcome: Progressing Goal: Ability to interact with others will improve Outcome: Progressing Goal: Demonstration of participation in decision-making regarding own care will improve Outcome: Progressing Goal: Ability to use eye contact when communicating with others will improve Outcome: Progressing   Problem: Health Behavior/Discharge Planning: Goal: Identification of resources available to assist in meeting health care needs will improve Outcome: Progressing   Problem: Self-Concept: Goal: Will verbalize positive feelings about self Outcome: Progressing

## 2019-10-09 NOTE — BHH Counselor (Signed)
LCSW Group Therapy Note  10/09/2019 1:00 PM  Type of Therapy/Topic:  Group Therapy:  Emotion Regulation  Participation Level:  Active   Description of Group:   The purpose of this group is to assist patients in learning to regulate negative emotions and experience positive emotions. Patients will be guided to discuss ways in which they have been vulnerable to their negative emotions. These vulnerabilities will be juxtaposed with experiences of positive emotions or situations, and patients will be challenged to use positive emotions to combat negative ones. Special emphasis will be placed on coping with negative emotions in conflict situations, and patients will process healthy conflict resolution skills.  Therapeutic Goals: 1. Patient will identify two positive emotions or experiences to reflect on in order to balance out negative emotions 2. Patient will label two or more emotions that they find the most difficult to experience 3. Patient will demonstrate positive conflict resolution skills through discussion and/or role plays  Summary of Patient Progress: Patient was present for group an an active participant.  Patient engaged in discussion on goals.  Patient was able to identify how to achieve her goals.  Patient was able to identify how to make her goals, S.M.A.R.T  Therapeutic Modalities:   Cognitive Behavioral Therapy Feelings Identification Dialectical Behavioral Therapy  Assunta Curtis, MSW, LCSW 10/09/2019 2:58 PM

## 2019-10-09 NOTE — Plan of Care (Signed)
Pt will engage in group activities Problem: Education: Goal: Ability to make informed decisions regarding treatment will improve Outcome: Progressing   Problem: Coping: Goal: Coping ability will improve Outcome: Progressing   Problem: Health Behavior/Discharge Planning: Goal: Identification of resources available to assist in meeting health care needs will improve Outcome: Progressing   Problem: Medication:Pt will remain medication compliant Goal: Compliance with prescribed medication regimen will improve Outcome: Progressing   Problem: Self-Concept: Goal: Ability to disclose and discuss suicidal ideas will improve Outcome: Progressing Goal: Will verbalize positive feelings about self Outcome: Progressing   Problem: Education: Goal: Utilization of techniques to improve thought processes will improve Outcome: Progressing Goal: Knowledge of the prescribed therapeutic regimen will improve Outcome: Progressing   Problem: Activity: Goal: Interest or engagement in leisure activities will improve Outcome: Progressing Goal: Imbalance in normal sleep/wake cycle will improve Outcome: Progressing   Problem: Coping: Goal: Coping ability will improve Outcome: Progressing Goal: Will verbalize feelings Outcome: Progressing   Problem: Health Behavior/Discharge Planning: Goal: Ability to make decisions will improve Outcome: Progressing Goal: Compliance with therapeutic regimen will improve Outcome: Progressing   Problem: Role Relationship: Goal: Will demonstrate positive changes in social behaviors and relationships Outcome: Progressing   Problem: Safety: Goal: Ability to disclose and discuss suicidal ideas will improve Outcome: Progressing Goal: Ability to identify and utilize support systems that promote safety will improve Outcome: Progressing   Problem: Self-Concept: Goal: Will verbalize positive feelings about self Outcome: Progressing Goal: Level of anxiety will  decrease Outcome: Progressing   Problem: Education: Goal: Knowledge of disease or condition will improve Outcome: Progressing Goal: Understanding of discharge needs will improve Outcome: Progressing   Problem: Health Behavior/Discharge Planning: Goal: Ability to identify changes in lifestyle to reduce recurrence of condition will improve Outcome: Progressing Goal: Identification of resources available to assist in meeting health care needs will improve Outcome: Progressing   Problem: Physical Regulation: Goal: Complications related to the disease process, condition or treatment will be avoided or minimized Outcome: Progressing   Problem: Safety: Goal: Ability to remain free from injury will improve Outcome: Progressing   Problem: Activity: Goal: Will identify at least one activity in which they can participate Outcome: Progressing   Problem: Coping: Goal: Ability to identify and develop effective coping behavior will improve Outcome: Progressing Goal: Ability to interact with others will improve Outcome: Progressing Goal: Demonstration of participation in decision-making regarding own care will improve Outcome: Progressing Goal: Ability to use eye contact when communicating with others will improve Outcome: Progressing   Problem: Health Behavior/Discharge Planning: Goal: Identification of resources available to assist in meeting health care needs will improve Outcome: Progressing   Problem: Self-Concept: Goal: Will verbalize positive feelings about self Outcome: Progressing

## 2019-10-09 NOTE — Progress Notes (Signed)
CSW spoke with pts legal guardian who reported he attempted to contact Dub Mikes but was unable to reach her and left her a message. CSW called and left message for Dub Mikes to give pts guardian a call to discuss placement and payor source.  Evalina Field, MSW, LCSW Clinical Social Work 10/09/2019 1:09 PM

## 2019-10-09 NOTE — BHH Counselor (Signed)
CSW attempted to call the patient's guardian and left a HIPAA compliant voicemial.  Assunta Curtis, MSW, LCSW 10/09/2019 8:07 AM

## 2019-10-09 NOTE — Progress Notes (Signed)
[facility]  10/09/2019 10:36 AM Krista HeadingGwendolyn [name]  MRN:  [id number]   Subjective: This is a follow-up for patient diagnosed with schizophrenia.  Patient reports that she is doing well today but she is concerned that she has some hair that is coming out.  However patient states that she was not combing and brushing her hair she should have prior to coming into the hospital and that she had not been taking care of it and is concerned that that may be the problem.  She also reports that she feels like she has some dry mouth and dry lips but states that she has been using Chapstick and that does help.  She also reports today that she had a bowel movement but that she went 2 days without a bowel movement and is requesting some medication to assist with that as well.  Patient denies any suicidal homicidal ideations and denies any hallucinations.  She reports that she was told yesterday that they may have found a new group home for her to go to and she had to speak to someone on the phone and she is hoping to discharge soon.  Principal Problem: Schizophrenia (HCC) Diagnosis: Principal Problem:   Schizophrenia (HCC) Active Problems:   Intellectual disability   Agitation  Total Time spent with patient: 30 minutes  Past Psychiatric History: Patient has a long history of this sort of thing and has had multiple hospitalizations and multiple visits to the emergency room.  In the time I have known her the pattern seems to have been pretty consistently that she will go off get agitated lose her temper break up some property for reasons that are never entirely clear to me and then by the time she gets to the emergency room she is usually calm down pleasant and cooperative which is how she is presenting now.  Patient has a history of mild intellectual disability as well as her mental health problems.  Psychiatric medicines have been helpful in the past but do not seem to have ever completely gotten this  behavior problem under control.  Past Medical History:  Past Medical History:  Diagnosis Date  . Anxiety   . Depression   . HSV infection   . Mental retardation   . Schizophrenia [facility](HCC)     Past Surgical History:  Procedure Laterality Date  . CESAREAN SECTION     Family History:  Family History  Problem Relation Age of Onset  . Cancer Neg Hx    Family Psychiatric  History: None known Social History:  Social History   Substance and Sexual Activity  Alcohol Use No     Social History   Substance and Sexual Activity  Drug Use No    Social History   Socioeconomic History  . Marital status: Significant Other    Spouse name: Not on file  . Number of children: Not on file  . Years of education: Not on file  . Highest education level: Not on file  Occupational History  . Not on file  Social Needs  . Financial resource strain: Not on file  . Food insecurity    Worry: Not on file    Inability: Not on file  . Transportation needs    Medical: Not on file    Non-medical: Not on file  Tobacco Use  . Smoking status: Never Smoker  . Smokeless tobacco: Never Used  Substance and Sexual Activity  . Alcohol use: No  . Drug use: No  .  Sexual activity: Yes    Birth control/protection: I.U.D., Condom  Lifestyle  . Physical activity    Days per week: Not on file    Minutes per session: Not on file  . Stress: Not on file  Relationships  . Social Musician on phone: Not on file    Gets together: Not on file    Attends religious service: Not on file    Active member of club or organization: Not on file    Attends meetings of clubs or organizations: Not on file    Relationship status: Not on file  Other Topics Concern  . Not on file  Social History Narrative  . Not on file   Additional Social History:                         Sleep: Good  Appetite:  Good  Current Medications: Current Facility-Administered Medications  Medication Dose Route  Frequency Provider Last Rate Last Dose  . acetaminophen (TYLENOL) tablet 650 mg  650 mg Oral Q6H PRN Gillermo Murdoch, NP   650 mg at 10/07/19 0811  . alum & mag hydroxide-simeth (MAALOX/MYLANTA) 200-200-20 MG/5ML suspension 30 mL  30 mL Oral Q4H PRN Gillermo Murdoch, NP      . Melene Muller ON 10/16/2019] ARIPiprazole ER (ABILIFY MAINTENA) 400 MG prefilled syringe 400 mg  400 mg Intramuscular Q21 days Jullia Mulligan, Gerlene Burdock, [place]      . benztropine (COGENTIN) tablet 0.5 mg  0.5 mg Oral QHS Clapacs, John T, MD   0.5 mg at 10/08/19 2133  . divalproex (DEPAKOTE) DR tablet 500 mg  500 mg Oral TID Gillermo Murdoch, NP   500 mg at 10/09/19 0816  . docusate sodium (COLACE) capsule 100 mg  100 mg Oral Daily Pierson Vantol, Feliz Beam B, FNP      . hydrocortisone cream 0.5 %   Topical BID PRN Clapacs, John T, MD      . hydrOXYzine (ATARAX/VISTARIL) tablet 50 mg  50 mg Oral TID PRN Porsha Skilton, Gerlene Burdock, FNP   50 mg at 10/08/19 2138  . ibuprofen (ADVIL) tablet 400 mg  400 mg Oral Q6H PRN Clapacs, Jackquline Denmark, MD   400 mg at 10/07/19 2113  . magnesium hydroxide (MILK OF MAGNESIA) suspension 30 mL  30 mL Oral Daily PRN Gillermo Murdoch, NP   30 mL at 10/08/19 2137  . menthol-cetylpyridinium (CEPACOL) lozenge 3 mg  1 lozenge Oral PRN Clapacs, John T, MD      . oxybutynin (DITROPAN) tablet 5 mg  5 mg Oral Daily Gillermo Murdoch, NP   5 mg at 10/09/19 0816  . phenol (CHLORASEPTIC) mouth spray 1 spray  1 spray Mouth/Throat PRN Maryagnes Amos, FNP   1 spray at 10/08/19 1652  . prazosin (MINIPRESS) capsule 4 mg  4 mg Oral QHS Malvin Johns, MD   4 mg at 10/08/19 2133  . risperiDONE (RISPERDAL) tablet 4 mg  4 mg Oral QHS Malvin Johns, MD   4 mg at 10/08/19 2133  . tuberculin injection 5 Units  5 Units Intradermal Once Clapacs, John T, MD      . valACYclovir (VALTREX) tablet 1,000 mg  1,000 mg Oral Daily Gillermo Murdoch, NP   1,000 mg at 10/09/19 0817  . venlafaxine XR (EFFEXOR-XR) 24 hr capsule 225 mg  225 mg Oral Daily  Gillermo Murdoch, NP   225 mg at 10/09/19 0816  . Vitamin D (Ergocalciferol) (DRISDOL) capsule 50,000 Units  50,000 Units  Oral Q Devin Going, NP   50,000 Units at 10/08/19 1610    Lab Results: No results found for this or any previous visit (from the past 48 hour(s)).  Blood Alcohol level:  Lab Results  Component Value Date   ETH <10 09/30/2019   ETH <10 07/16/2019    Metabolic Disorder Labs: Lab Results  Component Value Date   HGBA1C 5.3 03/09/2016   Lab Results  Component Value Date   PROLACTIN 26.3 (H) 03/09/2016   Lab Results  Component Value Date   CHOL 175 03/09/2016   TRIG 88 03/09/2016   HDL 56 03/09/2016   CHOLHDL 3.1 03/09/2016   VLDL 18 03/09/2016   LDLCALC 101 (H) 03/09/2016   LDLCALC 103 (H) 08/08/2014    Physical Findings: AIMS: Facial and Oral Movements Muscles of Facial Expression: None, normal Lips and Perioral Area: None, normal Jaw: None, normal Tongue: None, normal,Extremity Movements Upper (arms, wrists, hands, fingers): None, normal Lower (legs, knees, ankles, toes): None, normal, Trunk Movements Neck, shoulders, hips: None, normal, Overall Severity Severity of abnormal movements (highest score from questions above): None, normal Incapacitation due to abnormal movements: None, normal Patient's awareness of abnormal movements (rate only patient's report): No Awareness, Dental Status Current problems with teeth and/or dentures?: No Does patient usually wear dentures?: No  CIWA:  CIWA-Ar Total: 5 COWS:  COWS Total Score: 3  Musculoskeletal: Strength & Muscle Tone: within normal limits Gait & Station: normal Patient leans: N/A  Psychiatric Specialty Exam: Physical Exam  Nursing note and vitals reviewed. Constitutional: She is oriented to person, place, and time. She appears well-developed and well-nourished.  Cardiovascular: Normal rate.  Respiratory: Effort normal.  Musculoskeletal: Normal range of motion.   Neurological: She is alert and oriented to person, place, and time.  Skin: Skin is warm.    Review of Systems  Constitutional: Negative.   HENT: Negative.   Eyes: Negative.   Respiratory: Negative.   Cardiovascular: Negative.   Gastrointestinal: Negative.   Genitourinary: Negative.   Musculoskeletal: Negative.   Skin: Negative.   Neurological: Negative.   Endo/Heme/Allergies: Negative.   Psychiatric/Behavioral: Negative.     Blood pressure 125/79, pulse 87, temperature 98.1 F (36.7 C), temperature source Oral, resp. rate 17, height  (1.651 m), weight 81.6 kg, last menstrual period 09/17/2019, SpO2 100 %.Body mass index is 29.95 kg/m.  General Appearance: Casual and Fairly Groomed  Eye Contact:  Good  Speech:  Clear and Coherent and Normal Rate  Volume:  Normal  Mood:  Euthymic  Affect:  Congruent  Thought Process:  Coherent and Descriptions of Associations: Intact  Orientation:  Full (Time, Place, and Person)  Thought Content:  WDL  Suicidal Thoughts:  No  Homicidal Thoughts:  No  Memory:  Immediate;   Good Recent;   Good Remote;   Good  Judgement:  Fair  Insight:  Fair  Psychomotor Activity:  Normal  Concentration:  Concentration: Good  Recall:  Good  Fund of Knowledge:  Good  Language:  Fair  Akathisia:  No  Handed:  Right  AIMS (if indicated):     Assets:  Communication Skills Desire for Improvement Financial Resources/Insurance Resilience Social Support Transportation  ADL's:  Intact  Cognition:  WNL  Sleep:  Number of Hours: 6.5   Assessment: Patient presents in her room and is pleasant, calm, cooperative.  Patient has been attending groups and has been interacting with peers and staff appropriately.  Patient has shown significant improvement since she was admitted to  the hospital.  Currently patient is awaiting placement as she does have a guardian.  There is conversation with a group home to social work and the patient may have placement soon.   Patient did report some hair coming out but there were no noticeable bald spots or thinning hair and her visual assessment.  As for patient's constipation been reported I will start patient on some Colace to assist with her bowel movements.  All other medications will be continued as prescribed.  Treatment Plan Summary: Daily contact with patient to assess and evaluate symptoms and progress in treatment and Medication management Continue Abilify maintain 400 mg IM every 21 days with next dose being due 10/16/2019 Continue Cogentin 0.5 mg p.o. nightly for EPS Continue Depakote DR 500 mg p.o. 3 times daily for mood stability Start Colace 100 mg p.o. daily for constipation Continue Vistaril 50 mg p.o. 3 times daily as needed for anxiety Continue prazosin 4 mg p.o. nightly for nightmares Continue Risperdal 4 mg p.o. nightly for schizophrenia Tuberculin test injection once 5 units for placement and group home Continue Effexor XR 225 mg p.o. daily for depression and anxiety Encourage group therapy participation Continue every 15 minute safety checks  Lewis Shock, FNP 10/09/2019, 10:36 AM

## 2019-10-09 NOTE — [facility] Group Notes (Signed)
[facility] Group Notes:  (Nursing/MHT/Case Management/Adjunct)  Date:  10/09/2019  Time:  9:43 PM  Type of Therapy:  Group Therapy  Participation Level:  Active  Participation Quality:  Sharing and Missing her blouse  Affect:  Appropriate  Cognitive:  Alert  Insight:  Good  Engagement in Group:  Engaged  Modes of Intervention:  Activity  Summary of Progress/Problems:  Nehemiah Settle 10/09/2019, 9:43 PM

## 2019-10-10 NOTE — Progress Notes (Signed)
Recreation Therapy Notes   Date: 10/10/2019  Time: 9:30 am  Location: Craft room  Behavioral response: Appropriate   Intervention Topic: Decision Making    Discussion/Intervention:  Group content today was focused on Decision making. The group defined decision making and some positive ways they make decisions for themselves. Individuals expressed reasons why they neglected any decision making in the past. Patients described ways to improve decision making skills in the future. The group explained what could happen if they did not do any decision making at all. Participants express how bad decision has affected them and others around them. Individual explained the importance of decision making. The group participated in the intervention "Making decisions" where they had a chance to discover some of their weaknesses and strengths in decision making. Patient came up with a new decision-making skill to improve themselves in the future.  Clinical Observations/Feedback:  Patient came to group and defined decision making as deciding what you want to do with your life.She expressed that she is horrible at decision making and she normally allows others to make decisions for her.Participant explained that proper decision making reduces stress and tension. Individual was social with peers and staff while participating in the intervention.    Krista [name] LRT/CTRS         [name] 10/10/2019 11:13 AM

## 2019-10-10 NOTE — Progress Notes (Signed)
[facility] MD Progress Note  10/10/2019 11:25 AM Krista [name]  MRN:  [id number]   Subjective: This is a follow-up for patient diagnosed with schizophrenia.  Patient states that she is doing good today.  She states that she feels like she is sleeping better and has more improved appetite.  She states that she has been trying to go to groups and talk to people on the unit.  She denies any medication side effects.  She denies any suicidal or homicidal ideations and she denies any hallucinations.  She does report that she is a little anxious about going to the new group home and is asking warts located at.  She states that she feels that she is ready to go she was just a little anxious about a new place.  Principal Problem: Schizophrenia (HCC) Diagnosis: Principal Problem:   Schizophrenia (HCC) Active Problems:   Intellectual disability   Agitation  Total Time spent with patient: 30 minutes  Past Psychiatric History: Patient has a long history of this sort of thing and has had multiple hospitalizations and multiple visits to the emergency room.  In the time I have known her the pattern seems to have been pretty consistently that she will go off get agitated lose her temper break up some property for reasons that are never entirely clear to me and then by the time she gets to the emergency room she is usually calm down pleasant and cooperative which is how she is presenting now.  Patient has a history of mild intellectual disability as well as her mental health problems.  Psychiatric medicines have been helpful in the past but do not seem to have ever completely gotten this behavior problem under control.  Past Medical History:  Past Medical History:  Diagnosis Date  . Anxiety   . Depression   . HSV infection   . Mental retardation   . Schizophrenia [facility])     Past Surgical History:  Procedure Laterality Date  . CESAREAN SECTION     Family History:  Family History  Problem Relation Age of  Onset  . Cancer Neg Hx    Family Psychiatric  History: None known Social History:  Social History   Substance and Sexual Activity  Alcohol Use No     Social History   Substance and Sexual Activity  Drug Use No    Social History   Socioeconomic History  . Marital status: Significant Other    Spouse name: Not on file  . Number of children: Not on file  . Years of education: Not on file  . Highest education level: Not on file  Occupational History  . Not on file  Social Needs  . Financial resource strain: Not on file  . Food insecurity    Worry: Not on file    Inability: Not on file  . Transportation needs    Medical: Not on file    Non-medical: Not on file  Tobacco Use  . Smoking status: Never Smoker  . Smokeless tobacco: Never Used  Substance and Sexual Activity  . Alcohol use: No  . Drug use: No  . Sexual activity: Yes    Birth control/protection: I.U.D., Condom  Lifestyle  . Physical activity    Days per week: Not on file    Minutes per session: Not on file  . Stress: Not on file  Relationships  . Social Musician on phone: Not on file    Gets together: Not on file  Attends religious service: Not on file    Active member of club or organization: Not on file    Attends meetings of clubs or organizations: Not on file    Relationship status: Not on file  Other Topics Concern  . Not on file  Social History Narrative  . Not on file   Additional Social History:                         Sleep: Good  Appetite:  Good  Current Medications: Current Facility-Administered Medications  Medication Dose Route Frequency Provider Last Rate Last Dose  . acetaminophen (TYLENOL) tablet 650 mg  650 mg Oral Q6H PRN Caroline Sauger, NP   650 mg at 10/09/19 1613  . alum & mag hydroxide-simeth (MAALOX/MYLANTA) 200-200-20 MG/5ML suspension 30 mL  30 mL Oral Q4H PRN Caroline Sauger, NP      . Derrill Memo ON 10/16/2019] ARIPiprazole ER (ABILIFY  MAINTENA) 400 MG prefilled syringe 400 mg  400 mg Intramuscular Q21 days Raegan Winders, Darnelle Maffucci B, [place]      . benztropine (COGENTIN) tablet 0.5 mg  0.5 mg Oral QHS Clapacs, John T, MD   0.5 mg at 10/09/19 2128  . divalproex (DEPAKOTE) DR tablet 500 mg  500 mg Oral TID Caroline Sauger, NP   500 mg at 10/10/19 0856  . docusate sodium (COLACE) capsule 100 mg  100 mg Oral Daily Ayelet Gruenewald, Lowry Ram, FNP   100 mg at 10/10/19 7672  . hydrocortisone cream 0.5 %   Topical BID PRN Clapacs, John T, MD      . hydrOXYzine (ATARAX/VISTARIL) tablet 50 mg  50 mg Oral TID PRN Jaelani Posa, Lowry Ram, FNP   50 mg at 10/09/19 2129  . ibuprofen (ADVIL) tablet 400 mg  400 mg Oral Q6H PRN Clapacs, Madie Reno, MD   400 mg at 10/07/19 2113  . magnesium hydroxide (MILK OF MAGNESIA) suspension 30 mL  30 mL Oral Daily PRN Caroline Sauger, NP   30 mL at 10/08/19 2137  . menthol-cetylpyridinium (CEPACOL) lozenge 3 mg  1 lozenge Oral PRN Clapacs, John T, MD      . oxybutynin (DITROPAN) tablet 5 mg  5 mg Oral Daily Caroline Sauger, NP   5 mg at 10/10/19 0858  . phenol (CHLORASEPTIC) mouth spray 1 spray  1 spray Mouth/Throat PRN Suella Broad, FNP   1 spray at 10/08/19 1652  . prazosin (MINIPRESS) capsule 4 mg  4 mg Oral QHS Johnn Hai, MD   4 mg at 10/09/19 2128  . risperiDONE (RISPERDAL) tablet 4 mg  4 mg Oral QHS Johnn Hai, MD   4 mg at 10/09/19 2128  . tuberculin injection 5 Units  5 Units Intradermal Once Clapacs, Madie Reno, MD   5 Units at 10/09/19 1233  . valACYclovir (VALTREX) tablet 1,000 mg  1,000 mg Oral Daily Caroline Sauger, NP   1,000 mg at 10/10/19 0857  . venlafaxine XR (EFFEXOR-XR) 24 hr capsule 225 mg  225 mg Oral Daily Caroline Sauger, NP   225 mg at 10/10/19 0856  . Vitamin D (Ergocalciferol) (DRISDOL) capsule 50,000 Units  50,000 Units Oral Q Ivar Drape, NP   50,000 Units at 10/08/19 0947    Lab Results: No results found for this or any previous visit (from the past 48  hour(s)).  Blood Alcohol level:  Lab Results  Component Value Date   [facility] <10 09/30/2019   ETH <10 09/62/8366    Metabolic Disorder Labs: Lab  Results  Component Value Date   HGBA1C 5.3 03/09/2016   Lab Results  Component Value Date   PROLACTIN 26.3 (H) 03/09/2016   Lab Results  Component Value Date   CHOL 175 03/09/2016   TRIG 88 03/09/2016   HDL 56 03/09/2016   CHOLHDL 3.1 03/09/2016   VLDL 18 03/09/2016   LDLCALC 101 (H) 03/09/2016   LDLCALC 103 (H) 08/08/2014    Physical Findings: AIMS: Facial and Oral Movements Muscles of Facial Expression: None, normal Lips and Perioral Area: None, normal Jaw: None, normal Tongue: None, normal,Extremity Movements Upper (arms, wrists, hands, fingers): None, normal Lower (legs, knees, ankles, toes): None, normal, Trunk Movements Neck, shoulders, hips: None, normal, Overall Severity Severity of abnormal movements (highest score from questions above): None, normal Incapacitation due to abnormal movements: None, normal Patient's awareness of abnormal movements (rate only patient's report): No Awareness, Dental Status Current problems with teeth and/or dentures?: No Does patient usually wear dentures?: No  CIWA:  CIWA-Ar Total: 5 COWS:  COWS Total Score: 3  Musculoskeletal: Strength & Muscle Tone: within normal limits Gait & Station: normal Patient leans: N/A  Psychiatric Specialty Exam: Physical Exam  Nursing note and vitals reviewed. Constitutional: She is oriented to person, place, and time. She appears well-developed and well-nourished.  Cardiovascular: Normal rate.  Respiratory: Effort normal.  Musculoskeletal: Normal range of motion.  Neurological: She is alert and oriented to person, place, and time.  Skin: Skin is warm.    Review of Systems  Constitutional: Negative.   HENT: Negative.   Eyes: Negative.   Respiratory: Negative.   Cardiovascular: Negative.   Gastrointestinal: Negative.   Genitourinary: Negative.    Musculoskeletal: Negative.   Skin: Negative.   Neurological: Negative.   Endo/Heme/Allergies: Negative.   Psychiatric/Behavioral: Negative.     Blood pressure (!) 112/93, pulse 91, temperature 98.7 F (37.1 C), temperature source Oral, resp. rate 18, height 5\' 5"  (1.651 m), weight 81.6 kg, last menstrual period 09/17/2019, SpO2 100 %.Body mass index is 29.95 kg/m.  General Appearance: Casual and Fairly Groomed  Eye Contact:  Good  Speech:  Clear and Coherent and Normal Rate  Volume:  Normal  Mood:  Euthymic  Affect:  Congruent  Thought Process:  Coherent and Descriptions of Associations: Intact  Orientation:  Full (Time, Place, and Person)  Thought Content:  WDL  Suicidal Thoughts:  No  Homicidal Thoughts:  No  Memory:  Immediate;   Good Recent;   Good Remote;   Good  Judgement:  Fair  Insight:  Fair  Psychomotor Activity:  Normal  Concentration:  Concentration: Good  Recall:  Good  Fund of Knowledge:  Good  Language:  Fair  Akathisia:  No  Handed:  Right  AIMS (if indicated):     Assets:  Communication Skills Desire for Improvement Financial Resources/Insurance Resilience Social Support Transportation  ADL's:  Intact  Cognition:  WNL  Sleep:  Number of Hours: 4.75   Assessment: Patient presents in the day room and has been attending groups and interacting with peers and staff appropriately.  Patient has continued to deny any suicidal or homicidal ideations and any hallucinations.  Patient does appear to be slightly anxious about where she has been discharged to tomorrow.  However patient states that she is ready to go to the new group home so that she can get started.  She states that she will be compliant with medications and treatment after discharge because she does not want to mess up another group home stay  again.  Patient presents is pleasant, calm, and cooperative.  Awaiting results of tuberculin skin test which should be read tomorrow for patient to discharge to  new group home tomorrow.  Treatment Plan Summary: Daily contact with patient to assess and evaluate symptoms and progress in treatment and Medication management Continue Abilify maintain 400 mg IM every 21 days with next dose being due 10/16/2019 Continue Cogentin 0.5 mg p.o. nightly for EPS Continue Depakote DR 500 mg p.o. 3 times daily for mood stability Continue Colace 100 mg p.o. daily for constipation Continue Vistaril 50 mg p.o. 3 times daily as needed for anxiety Continue prazosin 4 mg p.o. nightly for nightmares Continue Risperdal 4 mg p.o. nightly for schizophrenia Continue Effexor XR 225 mg p.o. daily for depression and anxiety Encourage group therapy participation Continue every 15 minute safety checks Plan is for patient to discharge tomorrow to group home  Maryfrances Bunnell, FNP 10/10/2019, 11:25 AM

## 2019-10-10 NOTE — Plan of Care (Signed)
  Problem: Education: Goal: Ability to make informed decisions regarding treatment will improve Outcome: Progressing   Problem: Coping: Goal: Coping ability will improve Outcome: Progressing   Problem: Health Behavior/Discharge Planning: Goal: Identification of resources available to assist in meeting health care needs will improve Outcome: Progressing   Problem: Medication: Goal: Compliance with prescribed medication regimen will improve Outcome: Progressing   Problem: Self-Concept: Goal: Ability to disclose and discuss suicidal ideas will improve Outcome: Progressing Goal: Will verbalize positive feelings about self Outcome: Progressing   Problem: Education: Goal: Utilization of techniques to improve thought processes will improve Outcome: Progressing Goal: Knowledge of the prescribed therapeutic regimen will improve Outcome: Progressing   Problem: Activity: Goal: Interest or engagement in leisure activities will improve Outcome: Progressing Goal: Imbalance in normal sleep/wake cycle will improve Outcome: Progressing   Problem: Coping: Goal: Coping ability will improve Outcome: Progressing Goal: Will verbalize feelings Outcome: Progressing   Problem: Health Behavior/Discharge Planning: Goal: Ability to make decisions will improve Outcome: Progressing Goal: Compliance with therapeutic regimen will improve Outcome: Progressing   Problem: Role Relationship: Goal: Will demonstrate positive changes in social behaviors and relationships Outcome: Progressing   Problem: Safety: Goal: Ability to disclose and discuss suicidal ideas will improve Outcome: Progressing Goal: Ability to identify and utilize support systems that promote safety will improve Outcome: Progressing   Problem: Self-Concept: Goal: Will verbalize positive feelings about self Outcome: Progressing Goal: Level of anxiety will decrease Outcome: Progressing   Problem: Education: Goal: Knowledge of  disease or condition will improve Outcome: Progressing Goal: Understanding of discharge needs will improve Outcome: Progressing   Problem: Health Behavior/Discharge Planning: Goal: Ability to identify changes in lifestyle to reduce recurrence of condition will improve Outcome: Progressing Goal: Identification of resources available to assist in meeting health care needs will improve Outcome: Progressing   Problem: Physical Regulation: Goal: Complications related to the disease process, condition or treatment will be avoided or minimized Outcome: Progressing   Problem: Safety: Goal: Ability to remain free from injury will improve Outcome: Progressing   Problem: Activity: Goal: Will identify at least one activity in which they can participate Outcome: Progressing   Problem: Coping: Goal: Ability to identify and develop effective coping behavior will improve Outcome: Progressing Goal: Ability to interact with others will improve Outcome: Progressing Goal: Demonstration of participation in decision-making regarding own care will improve Outcome: Progressing Goal: Ability to use eye contact when communicating with others will improve Outcome: Progressing   Problem: Health Behavior/Discharge Planning: Goal: Identification of resources available to assist in meeting health care needs will improve Outcome: Progressing   Problem: Self-Concept: Goal: Will verbalize positive feelings about self Outcome: Progressing   

## 2019-10-10 NOTE — Progress Notes (Signed)
Pt has been calm and cooperative today . Pt is pleasant and looking forward to going to a group home. Collier Bullock RN

## 2019-10-10 NOTE — Plan of Care (Signed)
Pt denies depression, anxiety, SI, HI and AVH.  Pt was educated on care plan and verbalizes understanding. Collier Bullock RN Problem: Education: Goal: Ability to make informed decisions regarding treatment will improve Outcome: Progressing   Problem: Coping: Goal: Coping ability will improve Outcome: Progressing   Problem: Health Behavior/Discharge Planning: Goal: Identification of resources available to assist in meeting health care needs will improve Outcome: Progressing   Problem: Medication: Goal: Compliance with prescribed medication regimen will improve Outcome: Progressing   Problem: Self-Concept: Goal: Ability to disclose and discuss suicidal ideas will improve Outcome: Progressing Goal: Will verbalize positive feelings about self Outcome: Progressing   Problem: Education: Goal: Utilization of techniques to improve thought processes will improve Outcome: Progressing Goal: Knowledge of the prescribed therapeutic regimen will improve Outcome: Progressing   Problem: Activity: Goal: Interest or engagement in leisure activities will improve Outcome: Progressing Goal: Imbalance in normal sleep/wake cycle will improve Outcome: Progressing   Problem: Coping: Goal: Coping ability will improve Outcome: Progressing Goal: Will verbalize feelings Outcome: Progressing   Problem: Health Behavior/Discharge Planning: Goal: Ability to make decisions will improve Outcome: Progressing Goal: Compliance with therapeutic regimen will improve Outcome: Progressing   Problem: Role Relationship: Goal: Will demonstrate positive changes in social behaviors and relationships Outcome: Progressing   Problem: Safety: Goal: Ability to disclose and discuss suicidal ideas will improve Outcome: Progressing Goal: Ability to identify and utilize support systems that promote safety will improve Outcome: Progressing   Problem: Self-Concept: Goal: Will verbalize positive feelings about  self Outcome: Progressing Goal: Level of anxiety will decrease Outcome: Progressing   Problem: Education: Goal: Knowledge of disease or condition will improve Outcome: Progressing Goal: Understanding of discharge needs will improve Outcome: Progressing   Problem: Health Behavior/Discharge Planning: Goal: Ability to identify changes in lifestyle to reduce recurrence of condition will improve Outcome: Progressing Goal: Identification of resources available to assist in meeting health care needs will improve Outcome: Progressing   Problem: Physical Regulation: Goal: Complications related to the disease process, condition or treatment will be avoided or minimized Outcome: Progressing   Problem: Safety: Goal: Ability to remain free from injury will improve Outcome: Progressing   Problem: Activity: Goal: Will identify at least one activity in which they can participate Outcome: Progressing   Problem: Coping: Goal: Ability to identify and develop effective coping behavior will improve Outcome: Progressing Goal: Ability to interact with others will improve Outcome: Progressing Goal: Demonstration of participation in decision-making regarding own care will improve Outcome: Progressing Goal: Ability to use eye contact when communicating with others will improve Outcome: Progressing   Problem: Health Behavior/Discharge Planning: Goal: Identification of resources available to assist in meeting health care needs will improve Outcome: Progressing   Problem: Self-Concept: Goal: Will verbalize positive feelings about self Outcome: Progressing

## 2019-10-10 NOTE — BHH Group Notes (Signed)
Balance In Life 10/10/2019 1PM  Type of Therapy/Topic:  Group Therapy:  Balance in Life  Participation Level:  Active  Description of Group:   This group will address the concept of balance and how it feels and looks when one is unbalanced. Patients will be encouraged to process areas in their lives that are out of balance and identify reasons for remaining unbalanced. Facilitators will guide patients in utilizing problem-solving interventions to address and correct the stressor making their life unbalanced. Understanding and applying boundaries will be explored and addressed for obtaining and maintaining a balanced life. Patients will be encouraged to explore ways to assertively make their unbalanced needs known to significant others in their lives, using other group members and facilitator for support and feedback.  Therapeutic Goals: 1. Patient will identify two or more emotions or situations they have that consume much of in their lives. 2. Patient will identify signs/triggers that life has become out of balance:  3. Patient will identify two ways to set boundaries in order to achieve balance in their lives:  4. Patient will demonstrate ability to communicate their needs through discussion and/or role plays  Summary of Patient Progress:  Actively and appropriately participated in session. Pt became triggered by seeing the word trigger on a worksheet and began to cry. Pt stepped out of group and returned in a calmer state. Pt identified taking a hot shower and practicing yoga as healthy methods to find balance in life. Pt demonstrated insight and respected boundaries during group.  Therapeutic Modalities:   Cognitive Behavioral Therapy Solution-Focused Therapy Assertiveness Training  Zackari Ruane Lynelle Smoke, LCSW

## 2019-10-10 NOTE — Progress Notes (Signed)
Patient is appropriate and responding well in the unit, maintaining safety, med., compliant , no noticeable side effect, patient denies any SI/HI/AVH education is provided, patient has been attending group , safety rounding is maintained, medication is working well for she said, denies any DI/HI/AVH no distress.

## 2019-10-11 MED ORDER — HYDROXYZINE HCL 50 MG PO TABS: 50.0000 mg | ORAL_TABLET | Freq: Three times a day (TID) | ORAL | 1 refills | Status: AC | PRN

## 2019-10-11 MED ORDER — VENLAFAXINE HCL ER 75 MG PO CP24: 225.0000 mg | ORAL_CAPSULE | Freq: Every day | ORAL | 1 refills | Status: AC

## 2019-10-11 MED ORDER — PRAZOSIN HCL 2 MG PO CAPS: 4.0000 mg | ORAL_CAPSULE | Freq: Every day | ORAL | 1 refills | Status: AC

## 2019-10-11 MED ORDER — VALACYCLOVIR HCL 1 G PO TABS: 1000.0000 mg | ORAL_TABLET | Freq: Every day | ORAL | 1 refills | Status: AC

## 2019-10-11 MED ORDER — BENZTROPINE MESYLATE 0.5 MG PO TABS: 0.5000 mg | ORAL_TABLET | Freq: Every day | ORAL | 1 refills | Status: AC

## 2019-10-11 MED ORDER — ARIPIPRAZOLE ER 400 MG IM PRSY: 400.0000 mg | PREFILLED_SYRINGE | INTRAMUSCULAR | 1 refills | Status: AC

## 2019-10-11 MED ORDER — RISPERIDONE 4 MG PO TABS: 4.0000 mg | ORAL_TABLET | Freq: Every day | ORAL | 1 refills | Status: AC

## 2019-10-11 MED ORDER — DIVALPROEX SODIUM 500 MG PO DR TAB: 500.0000 mg | DELAYED_RELEASE_TABLET | Freq: Three times a day (TID) | ORAL | 1 refills | Status: AC

## 2019-10-11 MED ORDER — OXYBUTYNIN CHLORIDE 5 MG PO TABS: 5.0000 mg | ORAL_TABLET | Freq: Every day | ORAL | 1 refills | Status: AC

## 2019-10-11 NOTE — Progress Notes (Signed)
  [facility] Adult Case Management Discharge Plan :  Will you be returning to the same living situation after discharge:  Yes,  pt will be going to [address] [facility] home At discharge, do you have transportation home?: Yes,  group home will pick up Do you have the ability to pay for your medications: Yes,  medicaid  Release of information consent forms completed and in the chart;  Patient's signature needed at discharge.  Patient to Follow up at: Follow-up Information    Pc, [facility]. Go on 10/21/2019.   Why: You are scheduled to meet with [facility] on Monday, November 16th at 2pm. Thank you. Contact information: [place] [address] [phone]           Next level of care provider has access to [place] and Suicide Prevention discussed: Yes,  Solmon Ice, legal guardian     Has patient been referred to the Quitline?: N/A patient is not a smoker  Patient has been referred for addiction treatment: N/A  Yvette Rack, LCSW 10/11/2019, 9:58 AM

## 2019-10-11 NOTE — Progress Notes (Signed)
Recreation Therapy Notes  INPATIENT RECREATION TR PLAN  Patient Details Name: Berneice Zettlemoyer MRN: [id number] DOB: [birth year] Today's Date: 10/11/2019  Rec Therapy Plan Is patient appropriate for Therapeutic Recreation?: Yes Treatment times per week: At least 3 Estimated Length of Stay: 5-7 days TR Treatment/Interventions: Group participation (Comment)  Discharge Criteria Pt will be discharged from therapy if:: Discharged Treatment plan/goals/alternatives discussed and agreed upon by:: Patient/family  Discharge Summary Short term goals set: Patient will successfully identify 2 ways of making healthy decisions post d/c within 5 recreation therapy group sessions Short term goals met: Complete Progress toward goals comments: Groups attended Which groups?: Leisure education, Communication, Anger management, Stress management, Other (Comment)(Relaxation, decision making, self-care) Reason goals not met: N/A Therapeutic equipment acquired: N/A Reason patient discharged from therapy: Discharge from hospital Pt/family agrees with progress & goals achieved: Yes Date patient discharged from therapy: 10/11/19   Lauriana Denes 10/11/2019, 11:56 AM

## 2019-10-11 NOTE — Progress Notes (Signed)
   10/11/19 0840  PPD Results  Does patient have an induration at the injection site? No

## 2019-10-11 NOTE — Progress Notes (Signed)
Patient discharged home. DC instructions provided and explained to group home rep and patient . Medications reviewed. Rx given  Denies SI, HI, AVH. Belongings returned. Suicide Risk, transition given to rep. Patient stable at discharge.

## 2019-10-11 NOTE — BHH Counselor (Signed)
CSW left a message with receptionist at [facility](pt [phone]) to inform team the pt will discharge today. She reports Ms. White, team lead or Ms. Peacock, supervisor will follow up with CSW.

## 2019-10-11 NOTE — BHH Counselor (Signed)
CSW spoke with patient's ACT team. ACT reports that they will follow up with the patient Monday morning.  Assunta Curtis, MSW, LCSW 10/11/2019 2:56 PM

## 2019-10-11 NOTE — Discharge Summary (Signed)
Physician Discharge Summary Note  Patient:  Krista [name] is an 38 y.o., female MRN:  [id number] DOB:  08-21-1981 Patient phone:  [phone] (home)  Patient address:   [address] [place] [address],  Total Time spent with patient: 30 minutes  Date of Admission:  10/02/2019 Date of Discharge: 10/11/19  Reason for Admission:  38 year-old woman with schizophrenia who has been living in a group home.  She lost her temper got agitated smashed up a lot of things including a TV set in a window.  Patient tells me today that she cannot really explain it very well.  She got upset with her roommate about something that she cannot articulate and says that she got paranoid.  She admits to smashing things up and says she regrets it.  Currently denies any suicidal or homicidal thought.  Denies having hurt anybody yesterday.  She has been compliant with her medicine and denies there being any recent changes to them.  Not using any alcohol or drugs.  Slept poorly for about a day and was having some visual hallucinations last night but today denies any current auditory or visual hallucinations.  I am told so far that apparently this is the most recent and may be as many as 20 different group home placements this year.  Principal Problem: Schizophrenia [facility]) Discharge Diagnoses: Principal Problem:   Schizophrenia (HCC) Active Problems:   Intellectual disability   Agitation   Past Psychiatric History: Patient has a long history of this sort of thing and has had multiple hospitalizations and multiple visits to the emergency room.  In the time I have known her the pattern seems to have been pretty consistently that she will go off get agitated lose her temper break up some property for reasons that are never entirely clear to me and then by the time she gets to the emergency room she is usually calm down pleasant and cooperative which is how she is presenting now.  Patient has a history of mild  intellectual disability as well as her mental health problems.  Psychiatric medicines have been helpful in the past but do not seem to have ever completely gotten this behavior problem under control.  Past Medical History:  Past Medical History:  Diagnosis Date  . Anxiety   . Depression   . HSV infection   . Mental retardation   . Schizophrenia [facility])     Past Surgical History:  Procedure Laterality Date  . CESAREAN SECTION     Family History:  Family History  Problem Relation Age of Onset  . Cancer Neg Hx    Family Psychiatric  History: None known Social History:  Social History   Substance and Sexual Activity  Alcohol Use No     Social History   Substance and Sexual Activity  Drug Use No    Social History   Socioeconomic History  . Marital status: Significant Other    Spouse name: Not on file  . Number of children: Not on file  . Years of education: Not on file  . Highest education level: Not on file  Occupational History  . Not on file  Social Needs  . Financial resource strain: Not on file  . Food insecurity    Worry: Not on file    Inability: Not on file  . Transportation needs    Medical: Not on file    Non-medical: Not on file  Tobacco Use  . Smoking status: Never Smoker  .  Smokeless tobacco: Never Used  Substance and Sexual Activity  . Alcohol use: No  . Drug use: No  . Sexual activity: Yes    Birth control/protection: I.U.D., Condom  Lifestyle  . Physical activity    Days per week: Not on file    Minutes per session: Not on file  . Stress: Not on file  Relationships  . Social Musician on phone: Not on file    Gets together: Not on file    Attends religious service: Not on file    Active member of club or organization: Not on file    Attends meetings of clubs or organizations: Not on file    Relationship status: Not on file  Other Topics Concern  . Not on file  Social History Narrative  . Not on file    Hospital Course:   Patient remained on the [facility] unit for 9 days. The patient stabilized on medication and therapy. Patient was discharged on Abilify maintain a 400 mg IM every 21 days with next dose being due on 10/16/2019, Cogentin 0.5 mg p.o. nightly, Depakote DR 500 mg p.o. 3 times daily, Vistaril 50 mg p.o. 3 times daily as needed for anxiety, Ditropan 5 mg p.o. daily per PCP, prazosin 4 mg p.o. nightly for nightmares, Risperdal 4 mg p.o. nightly for schizophrenia, Effexor XR 225 mg p.o. daily for depression. Patient has shown improvement with improved mood, affect, sleep, appetite, and interaction. Patient has attended group and participated. Patient has been seen in the day room interacting with peers and staff appropriately. Patient denies any SI/HI/AVH and contracts for safety. Patient agrees to follow up at [facility]. Patient is provided with prescriptions for their medications upon discharge. During the days that the patient remained on the unit she stabilized within a few days, however the patient was immediately discharged from the group home due to her threatening one of the staff members and placement was being sought by her guardian.  Patient has been calm, cooperative, and pleasant during her stay.  Patient did report some anxiety about going to a new group home.  Guardian found new placement at a group home and has been waiting the last 2 days for TB test results from before patient could leave to go to the group home.  Physical Findings: AIMS: Facial and Oral Movements Muscles of Facial Expression: None, normal Lips and Perioral Area: None, normal Jaw: None, normal Tongue: None, normal,Extremity Movements Upper (arms, wrists, hands, fingers): None, normal Lower (legs, knees, ankles, toes): None, normal, Trunk Movements Neck, shoulders, hips: None, normal, Overall Severity Severity of abnormal movements (highest score from questions above): None, normal Incapacitation due to abnormal  movements: None, normal Patient's awareness of abnormal movements (rate only patient's report): No Awareness, Dental Status Current problems with teeth and/or dentures?: No Does patient usually wear dentures?: No  CIWA:  CIWA-Ar Total: 5 COWS:  COWS Total Score: 3  Musculoskeletal: Strength & Muscle Tone: within normal limits Gait & Station: normal Patient leans: N/A  Psychiatric Specialty Exam: Physical Exam  Nursing note and vitals reviewed. Constitutional: She is oriented to person, place, and time. She appears well-developed and well-nourished.  Cardiovascular: Normal rate.  Respiratory: Effort normal.  Musculoskeletal: Normal range of motion.  Neurological: She is alert and oriented to person, place, and time.  Skin: Skin is warm.    Review of Systems  Constitutional: Negative.   HENT: Negative.   Eyes: Negative.   Respiratory: Negative.  Cardiovascular: Negative.   Gastrointestinal: Negative.   Genitourinary: Negative.   Musculoskeletal: Negative.   Skin: Negative.   Neurological: Negative.   Endo/Heme/Allergies: Negative.   Psychiatric/Behavioral: Negative.     Blood pressure 119/78, pulse 85, temperature 98.1 F (36.7 C), temperature source Oral, resp. rate 18, height 5\' 5"  (1.651 m), weight 81.6 kg, last menstrual period 09/17/2019, SpO2 100 %.Body mass index is 29.95 kg/m.   General Appearance: Casual  Eye Contact::  Good  Speech:  Clear and [id number]  Volume:  Normal  Mood:  Euthymic  Affect:  Congruent  Thought Process:  Goal Directed  Orientation:  Full (Time, Place, and Person)  Thought Content:  Logical  Suicidal Thoughts:  No  Homicidal Thoughts:  No  Memory:  Immediate;   Fair Recent;   Fair Remote;   Fair  Judgement:  Fair  Insight:  Fair  Psychomotor Activity:  Normal  Concentration:  Fair  Recall:  [place]  Language: Fair  Akathisia:  No  Handed:  Right  AIMS (if indicated):     Assets:  Desire for  Improvement Housing Physical Health Resilience Social Support  Sleep:  Number of Hours: 7.25  Cognition: Impaired,  Mild  ADL's:  Intact      Has this patient used any form of tobacco in the last 30 days? (Cigarettes, Smokeless Tobacco, Cigars, and/or Pipes) Yes, No  Blood Alcohol level:  Lab Results  Component Value Date   ETH <10 09/30/2019   ETH <10 32/95/1884    Metabolic Disorder Labs:  Lab Results  Component Value Date   HGBA1C 5.3 03/09/2016   Lab Results  Component Value Date   PROLACTIN 26.3 (H) 03/09/2016   Lab Results  Component Value Date   CHOL 175 03/09/2016   TRIG 88 03/09/2016   HDL 56 03/09/2016   CHOLHDL 3.1 03/09/2016   VLDL 18 03/09/2016   LDLCALC 101 (H) 03/09/2016   LDLCALC 103 (H) 08/08/2014    See Psychiatric Specialty Exam and Suicide Risk Assessment completed by Attending Physician prior to discharge.  Discharge destination:  Other:  Group home  Is patient on multiple antipsychotic therapies at discharge:  Yes,   Do you recommend tapering to monotherapy for antipsychotics?  No   Has Patient had three or more failed trials of antipsychotic monotherapy by history:  Yes based on patient's history  Recommended Plan for Multiple Antipsychotic Therapies: Taper to monotherapy as described:  Once stabilized and followed by outpatient provider, may be able to taper Risperdal and manage patient with Abilify Maintena IM injection   Discharge Instructions    Diet - low sodium heart healthy   Complete by: As directed    Increase activity slowly   Complete by: As directed      Allergies as of 10/11/2019   No Known Allergies     Medication List    STOP taking these medications   ARIPiprazole 20 MG tablet Commonly known as: ABILIFY Replaced by: ARIPiprazole ER 400 MG Prsy prefilled syringe   OLANZapine zydis 5 MG disintegrating tablet Commonly known as: ZYPREXA     TAKE these medications     Indication  ARIPiprazole ER 400 MG Prsy  prefilled syringe Commonly known as: ABILIFY MAINTENA Inject 400 mg into the muscle every 21 ( twenty-one) days. Next dose due on 10/16/2019 Start taking on: October 16, 2019 Replaces: ARIPiprazole 20 MG tablet  Indication: Schizophrenia   benztropine 0.5 MG tablet Commonly known as: COGENTIN Take 1 tablet (  0.5 mg total) by mouth at bedtime.  Indication: Extrapyramidal Reaction caused by Medications   divalproex 500 MG DR tablet Commonly known as: DEPAKOTE Take 1 tablet (500 mg total) by mouth 3 (three) times daily.  Indication: Schizophrenia   hydrOXYzine 50 MG tablet Commonly known as: ATARAX/VISTARIL Take 1 tablet (50 mg total) by mouth 3 (three) times daily as needed for anxiety.  Indication: Feeling Anxious   oxybutynin 5 MG tablet Commonly known as: DITROPAN Take 1 tablet (5 mg total) by mouth daily.  Indication: Per PCP   prazosin 2 MG capsule Commonly known as: MINIPRESS Take 2 capsules (4 mg total) by mouth at bedtime. What changed: how much to take  Indication: Frightening Dreams   risperidone 4 MG tablet Commonly known as: RISPERDAL Take 1 tablet (4 mg total) by mouth at bedtime.  Indication: Schizophrenia   valACYclovir 1000 MG tablet Commonly known as: VALTREX Take 1 tablet (1,000 mg total) by mouth daily.  Indication: Herpes Simplex Infection   venlafaxine XR 75 MG 24 hr capsule Commonly known as: EFFEXOR-XR Take 3 capsules (225 mg total) by mouth daily.  Indication: Major Depressive Disorder   Vitamin D (Ergocalciferol) 1.25 MG (50000 UT) Caps capsule Commonly known as: DRISDOL Take 50,000 Units by mouth every Tuesday.  Indication: Per PCP      Follow-up Information    Pc, [facility]. Go on 10/21/2019.   Why: You are scheduled to meet with [facility] on Monday, November 16th at 2pm. Thank you. Contact information: [address] [place] [address] [phone]           Follow-up recommendations:  Continue activity  as tolerated. Continue diet as recommended by your PCP. Ensure to keep all appointments with outpatient providers.  Comments:  Patient is instructed prior to discharge to: Take all medications as prescribed by his/her mental healthcare provider. Report any adverse effects and or reactions from the medicines to his/her outpatient provider promptly. Patient has been instructed & cautioned: To not engage in alcohol and or illegal drug use while on prescription medicines. In the event of worsening symptoms, patient is instructed to call the crisis hotline, 911 and or go to the nearest ED for appropriate evaluation and treatment of symptoms. To follow-up with his/her primary care provider for your other medical issues, concerns and or health care needs.    Signed: Gerlene Burdockravis B Celisa Schoenberg, FNP 10/11/2019, 10:53 AM

## 2019-10-11 NOTE — Progress Notes (Signed)
Recreation Therapy Notes  Date: 10/11/2019  Time: 9:30 am  Location: Craft room  Behavioral response: Appropriate   Intervention Topic: Self-care    Discussion/Intervention:   Group content today was focused on Self-Care. The group defined self-care and some positive ways they care for themselves. Individuals expressed ways and reasons why they neglected any self-care in the past. Patients described ways to improve self-care in the future. The group explained what could happen if they did not do any self-care activities at all. The group participated in the intervention "self-care assessment" where they had a chance to discover some of their weaknesses and strengths in self- care. Patient came up with a self-care plan to improve themselves in the future.  Clinical Observations/Feedback:  Patient came to group and defined self-care as taking time out for self. She explained she could improve her self-care skills by allowing herself to slow down. Participant expressed that pleasing others is a reason she has neglected self-care in the past. Patient stated that if self-care is neglected a person can relapse. Individual was social with peers and staff while participating in the intervention.    Krista [name] LRT/CTRS         Krista [name] 10/11/2019 11:10 AM

## 2019-10-11 NOTE — BHH Suicide Risk Assessment (Signed)
[facility] Discharge Suicide Risk Assessment   Principal Problem: Schizophrenia [facility]) Discharge Diagnoses: Principal Problem:   Schizophrenia ([place]) Active Problems:   Intellectual disability   Agitation   Total Time spent with patient: 30 minutes  Musculoskeletal: Strength & Muscle Tone: within normal limits Gait & Station: normal Patient leans: N/A  Psychiatric Specialty Exam: Review of Systems  Constitutional: Negative.   HENT: Negative.   Eyes: Negative.   Respiratory: Negative.   Cardiovascular: Negative.   Gastrointestinal: Negative.   Musculoskeletal: Negative.   Skin: Negative.   Neurological: Negative.   Psychiatric/Behavioral: Negative.     Blood pressure 119/78, pulse 85, temperature 98.1 F (36.7 C), temperature source Oral, resp. rate 18, height 5\' 5"  (1.651 m), weight 81.6 kg, last menstrual period 09/17/2019, SpO2 100 %.Body mass index is 29.95 kg/m.  General Appearance: Casual  Eye Contact::  Good  Speech:  Clear and [id number]  Volume:  Normal  Mood:  Euthymic  Affect:  Congruent  Thought Process:  Goal Directed  Orientation:  Full (Time, Place, and Person)  Thought Content:  Logical  Suicidal Thoughts:  No  Homicidal Thoughts:  No  Memory:  Immediate;   Fair Recent;   Fair Remote;   Fair  Judgement:  Fair  Insight:  Fair  Psychomotor Activity:  Normal  Concentration:  Fair  Recall:  [employer] [place]  Language: Fair  Akathisia:  No  Handed:  Right  AIMS (if indicated):     Assets:  Desire for Improvement Housing Physical Health Resilience Social Support  Sleep:  Number of Hours: 7.25  Cognition: Impaired,  Mild  ADL's:  Intact   Mental Status Per Nursing Assessment::   On Admission:  NA  Demographic Factors:  NA  Loss Factors: NA  Historical Factors: Impulsivity  Risk Reduction Factors:   Positive social support and Positive therapeutic relationship  Continued Clinical Symptoms:  Schizophrenia:   Paranoid or  undifferentiated type  Cognitive Features That Contribute To Risk:  None    Suicide Risk:  Minimal: No identifiable suicidal ideation.  Patients presenting with no risk factors but with morbid ruminations; may be classified as minimal risk based on the severity of the depressive symptoms    Plan Of Care/Follow-up recommendations:  Activity:  Activity as tolerated Diet:  Regular diet Other:  Follow-up with outpatient treatment as recommended.  Continue current medicine.  Continue positive work on controlling temper.  Patient expresses her commitment to doing this and trying to stay stable at her new group home.  Alethia Berthold, MD 10/11/2019, 9:21 AM

## 2019-11-13 ENCOUNTER — Telehealth: Payer: Self-pay

## 2019-11-13 NOTE — Telephone Encounter (Signed)
Referring doctor office, called to see if patient came in for pelvic/annual exam? Office asked if we could send the office notes from that exam? Fax number is [phone])

## 2019-11-14 NOTE — Telephone Encounter (Signed)
Tried to call the office and was on hold for 10 minutes. Disconnected phone. Will try to call back.

## 2019-12-11 ENCOUNTER — Telehealth: Payer: Self-pay | Admitting: Obstetrics and Gynecology

## 2019-12-11 NOTE — Telephone Encounter (Signed)
Noted  

## 2019-12-11 NOTE — Telephone Encounter (Signed)
Krista [name] from [employer] needs a call back. The pt has a med that she takes and it was changed and they have a question on why. [phone]

## 2019-12-11 NOTE — Telephone Encounter (Signed)
Crystal called back in and stated that she found what she needed no need to fax anything over

## 2019-12-11 NOTE — Telephone Encounter (Signed)
Spoke with Crystal at Dr. Dario Guardian office. She was asking about medication change that a patient had for her valtrex. She stated that the patient was supposed to be taking it twice a day. I explained that Dr. Logan Bores had not changed any medication for the patient. She ask me to look at patients chart to see how she was taking it when she came in. I tole her that it showed that she was taking it once a day on our paperwork. Crystal verbalized understanding.

## 2019-12-11 NOTE — Telephone Encounter (Signed)
Crystal called back to request the last office note for the patient. Please fax to [phone]

## 2020-01-08 ENCOUNTER — Ambulatory Visit: Payer: Medicaid Other | Attending: Internal Medicine

## 2020-01-08 DIAGNOSIS — Z20822 Contact with and (suspected) exposure to covid-19: Secondary | ICD-10-CM

## 2020-01-09 LAB — NOVEL CORONAVIRUS, NAA: SARS-CoV-2, NAA: NOT DETECTED

## 2020-01-23 ENCOUNTER — Encounter: Payer: Self-pay | Admitting: *Deleted

## 2020-01-23 ENCOUNTER — Other Ambulatory Visit: Payer: Self-pay

## 2020-01-23 ENCOUNTER — Emergency Department
Admission: EM | Admit: 2020-01-23 | Discharge: 2020-01-24 | Disposition: A | Discharge status: Home / Self Care | Payer: Medicaid Other | Attending: Emergency Medicine | Admitting: Emergency Medicine

## 2020-01-23 DIAGNOSIS — F259 Schizoaffective disorder, unspecified: Secondary | ICD-10-CM | POA: Diagnosis not present

## 2020-01-23 DIAGNOSIS — Z975 Presence of (intrauterine) contraceptive device: Secondary | ICD-10-CM

## 2020-01-23 DIAGNOSIS — F79 Unspecified intellectual disabilities: Secondary | ICD-10-CM | POA: Diagnosis not present

## 2020-01-23 DIAGNOSIS — Z20822 Contact with and (suspected) exposure to covid-19: Secondary | ICD-10-CM | POA: Insufficient documentation

## 2020-01-23 DIAGNOSIS — R45851 Suicidal ideations: Secondary | ICD-10-CM | POA: Insufficient documentation

## 2020-01-23 DIAGNOSIS — F209 Schizophrenia, unspecified: Secondary | ICD-10-CM | POA: Diagnosis present

## 2020-01-23 DIAGNOSIS — R456 Violent behavior: Secondary | ICD-10-CM | POA: Diagnosis present

## 2020-01-23 DIAGNOSIS — F603 Borderline personality disorder: Secondary | ICD-10-CM | POA: Diagnosis not present

## 2020-01-23 DIAGNOSIS — Z79899 Other long term (current) drug therapy: Secondary | ICD-10-CM | POA: Diagnosis not present

## 2020-01-23 DIAGNOSIS — R451 Restlessness and agitation: Secondary | ICD-10-CM | POA: Diagnosis present

## 2020-01-23 DIAGNOSIS — Z30431 Encounter for routine checking of intrauterine contraceptive device: Secondary | ICD-10-CM

## 2020-01-23 DIAGNOSIS — R102 Pelvic and perineal pain: Secondary | ICD-10-CM | POA: Diagnosis present

## 2020-01-23 DIAGNOSIS — A6000 Herpesviral infection of urogenital system, unspecified: Secondary | ICD-10-CM | POA: Diagnosis present

## 2020-01-23 DIAGNOSIS — F251 Schizoaffective disorder, depressive type: Secondary | ICD-10-CM | POA: Diagnosis present

## 2020-01-23 DIAGNOSIS — R4589 Other symptoms and signs involving emotional state: Secondary | ICD-10-CM | POA: Diagnosis present

## 2020-01-23 DIAGNOSIS — N939 Abnormal uterine and vaginal bleeding, unspecified: Secondary | ICD-10-CM | POA: Diagnosis present

## 2020-01-23 LAB — CBC
HCT: 33.6 % — ABNORMAL LOW (ref 36.0–46.0)
Hemoglobin: 11.5 g/dL — ABNORMAL LOW (ref 12.0–15.0)
MCH: 25.3 pg — ABNORMAL LOW (ref 26.0–34.0)
MCHC: 34.2 g/dL (ref 30.0–36.0)
MCV: 73.8 fL — ABNORMAL LOW (ref 80.0–100.0)
Platelets: 175 10*3/uL (ref 150–400)
RBC: 4.55 MIL/uL (ref 3.87–5.11)
RDW: 14.5 % (ref 11.5–15.5)
WBC: 5.8 10*3/uL (ref 4.0–10.5)
nRBC: 0 % (ref 0.0–0.2)

## 2020-01-23 LAB — COMPREHENSIVE METABOLIC PANEL
ALT: 13 U/L (ref 0–44)
AST: 24 U/L (ref 15–41)
Albumin: 3.9 g/dL (ref 3.5–5.0)
Alkaline Phosphatase: 42 U/L (ref 38–126)
Anion gap: 9 (ref 5–15)
BUN: 12 mg/dL (ref 6–20)
CO2: 24 mmol/L (ref 22–32)
Calcium: 8.9 mg/dL (ref 8.9–10.3)
Chloride: 99 mmol/L (ref 98–111)
Creatinine, Ser: 0.76 mg/dL (ref 0.44–1.00)
GFR calc Af Amer: >60 mL/min (ref >60)
GFR calc non Af Amer: >60 mL/min (ref >60)
Glucose, Bld: 111 mg/dL — ABNORMAL HIGH (ref 70–99)
Potassium: 4.5 mmol/L (ref 3.5–5.1)
Sodium: 132 mmol/L — ABNORMAL LOW (ref 135–145)
Total Bilirubin: 0.8 mg/dL (ref 0.3–1.2)
Total Protein: 7.4 g/dL (ref 6.5–8.1)

## 2020-01-23 LAB — RESPIRATORY PANEL BY RT PCR (FLU A&B, COVID)
Influenza A by PCR: NEGATIVE
Influenza B by PCR: NEGATIVE
SARS Coronavirus 2 by RT PCR: NEGATIVE

## 2020-01-23 LAB — SALICYLATE LEVEL: Salicylate Lvl: <7 mg/dL — ABNORMAL LOW (ref 7.0–30.0)

## 2020-01-23 LAB — ETHANOL: Alcohol, Ethyl (B): <10 mg/dL (ref <10)

## 2020-01-23 LAB — ACETAMINOPHEN LEVEL: Acetaminophen (Tylenol), Serum: <10 ug/mL — ABNORMAL LOW (ref 10–30)

## 2020-01-23 NOTE — Consult Note (Signed)
[facility] Face-to-Face Psychiatry Consult   Reason for Consult: Behavior problems Referring Physician:  Dr. Kerman Passey Patient Identification: Ramiyah Mcclenahan MRN:  [id number] Principal Diagnosis: <principal problem not specified> Diagnosis:  Active Problems:   Herpes genitalis   Borderline personality disorder ([place])   Intellectual disability   Schizoaffective disorder, depressive type ([place])   IUD contraception   Suicidal behavior   IUD check up   Pelvic pain   Abnormal uterine bleeding (AUB)   Agitation   Schizophrenia ([place])   Total Time spent with patient: 30 minutes  Subjective: "I lost my temper and got upset at something my mother said." Melodye Swor is a 39 y.o. female patient presented to [facility] ED via law enforcement under involuntary commitment status (IVC). Per the patient's IVC paperwork which stated she was throwing stuff in her room and took a piece of glass, and threatened to kill herself.  . The patient resides at [facility] in [place]. The patient disclosed she became upset with her mother, and she lost her temper because of something her mother said.  Patient's voice "I felt like the staff was yelling at me, and that made me angry."    The patient was seen face-to-face by this provider; chart reviewed and consulted with Dr. Kerman Passey on 01/22/2019 due to the patient's care. It was discussed with the EDP that the patient does not meet the criteria to be admitted to the psychiatric inpatient unit.  The patient is alert and oriented x 2-3, anxious but cooperative, and mood-congruent with affect on evaluation.  The patient does not appear to be responding to internal or external stimuli. Neither is the patient presenting with any delusional thinking. The patient denies auditory or visual hallucinations. The patient denies suicidal, homicidal, or self-harm ideations. The patient is not presenting with any psychotic or paranoid behaviors. During an encounter with the patient, she  was able to answer most questions appropriately.  HPI: Dr. Kerman Passey; Sevilla Murtagh is a 39 y.o. female with a past medical history of anxiety, depression, schizophrenia, MR, presents to the emergency department from her group home after she lost her temperature and became very aggressive and threatened suicide.  According to report and the patient she got agitated, states she lost her temper got very upset.  Patient states during her agitation she also threatened to kill her self which she now regrets per patient.  Denies any SI or HI feelings currently.  Denies any medical complaints tonight.  Specifically denies any chest pain fever cough or shortness of breath.  Past Psychiatric History:  Anxiety Depression Mental retardation Schizophrenia ([place])  Risk to Self:   No Risk to Others:  No Prior Inpatient Therapy:   Yes Prior Outpatient Therapy:   Yes  Past Medical History:  Past Medical History:  Diagnosis Date  . Anxiety   . Depression   . HSV infection   . Mental retardation   . Schizophrenia [facility])     Past Surgical History:  Procedure Laterality Date  . CESAREAN SECTION     Family History:  Family History  Problem Relation Age of Onset  . Cancer Neg Hx    Family Psychiatric  History:  Social History:  Social History   Substance and Sexual Activity  Alcohol Use No     Social History   Substance and Sexual Activity  Drug Use No    Social History   Socioeconomic History  . Marital status: Single    Spouse name: Not on file  . Number  of children: Not on file  . Years of education: Not on file  . Highest education level: Not on file  Occupational History  . Not on file  Tobacco Use  . Smoking status: Never Smoker  . Smokeless tobacco: Never Used  Substance and Sexual Activity  . Alcohol use: No  . Drug use: No  . Sexual activity: Yes    Birth control/protection: I.U.D., Condom  Other Topics Concern  . Not on file  Social History Narrative  . Not  on file   Social Determinants of Health   Financial Resource Strain:   . Difficulty of Paying Living Expenses: Not on file  Food Insecurity:   . Worried About Programme researcher, broadcasting/film/video in the Last Year: Not on file  . Ran Out of Food in the Last Year: Not on file  Transportation Needs:   . Lack of Transportation (Medical): Not on file  . Lack of Transportation (Non-Medical): Not on file  Physical Activity:   . Days of Exercise per Week: Not on file  . Minutes of Exercise per Session: Not on file  Stress:   . Feeling of Stress : Not on file  Social Connections:   . Frequency of Communication with Friends and Family: Not on file  . Frequency of Social Gatherings with Friends and Family: Not on file  . Attends Religious Services: Not on file  . Active Member of Clubs or Organizations: Not on file  . Attends Banker Meetings: Not on file  . Marital Status: Not on file   Additional Social History:    Allergies:  No Known Allergies  Labs:  Results for orders placed or performed during the hospital encounter of 01/23/20 (from the past 48 hour(s))  Comprehensive metabolic panel     Status: Abnormal   Collection Time: 01/23/20  7:06 PM  Result Value Ref Range   Sodium 132 (L) 135 - 145 mmol/L   Potassium 4.5 3.5 - 5.1 mmol/L    Comment: HEMOLYSIS AT THIS LEVEL MAY AFFECT RESULT   Chloride 99 98 - 111 mmol/L   CO2 24 22 - 32 mmol/L   Glucose, Bld 111 (H) 70 - 99 mg/dL   BUN 12 6 - 20 mg/dL   Creatinine, Ser 7.98 0.44 - 1.00 mg/dL   Calcium 8.9 8.9 - 92.1 mg/dL   Total Protein 7.4 6.5 - 8.1 g/dL   Albumin 3.9 3.5 - 5.0 g/dL   AST 24 15 - 41 U/L   ALT 13 0 - 44 U/L   Alkaline Phosphatase 42 38 - 126 U/L   Total Bilirubin 0.8 0.3 - 1.2 mg/dL   GFR calc non Af Amer >60 >60 mL/min   GFR calc Af Amer >60 >60 mL/min   Anion gap 9 5 - 15    Comment: Performed at [facility], [address] [place], [place] [address]  Ethanol     Status: None   Collection Time:  01/23/20  7:06 PM  Result Value Ref Range   Alcohol, Ethyl (B) <10 <10 mg/dL    Comment: (NOTE) Lowest detectable limit for serum alcohol is 10 mg/dL. For medical purposes only. Performed at [facility], [address] [place], [place] [address]   Salicylate level     Status: Abnormal   Collection Time: 01/23/20  7:06 PM  Result Value Ref Range   Salicylate Lvl <7.0 (L) 7.0 - 30.0 mg/dL    Comment: Performed at [facility], [address] [place], [place] [address]  Acetaminophen level     Status: Abnormal   Collection Time: 01/23/20  7:06 PM  Result Value Ref Range   Acetaminophen (Tylenol), Serum <10 (L) 10 - 30 ug/mL    Comment: (NOTE) Therapeutic concentrations vary significantly. A range of 10-30 ug/mL  may be an effective concentration for many patients. However, some  are best treated at concentrations outside of this range. Acetaminophen concentrations >150 ug/mL at 4 hours after ingestion  and >50 ug/mL at 12 hours after ingestion are often associated with  toxic reactions. Performed at [facility], [address] [place], [place] [address]   cbc     Status: Abnormal   Collection Time: 01/23/20  7:06 PM  Result Value Ref Range   WBC 5.8 4.0 - 10.5 K/uL   RBC 4.55 3.87 - 5.11 MIL/uL   Hemoglobin 11.5 (L) 12.0 - 15.0 g/dL   HCT 57.8 (L) 46.9 - 62.9 %   MCV 73.8 (L) 80.0 - 100.0 fL   MCH 25.3 (L) 26.0 - 34.0 pg   MCHC 34.2 30.0 - 36.0 g/dL   RDW 52.8 41.3 - 24.4 %   Platelets 175 150 - 400 K/uL   nRBC 0.0 0.0 - 0.2 %    Comment: Performed at [facility], [address] [place], [place] [address]    No current facility-administered medications for this encounter.   Current Outpatient Medications  Medication Sig Dispense Refill  . ARIPiprazole ER (ABILIFY MAINTENA) 400 MG PRSY prefilled syringe Inject 400 mg into the muscle every 21 ( twenty-one) days. Next dose due on 10/16/2019 1 each 1  . benztropine (COGENTIN)  0.5 MG tablet Take 1 tablet (0.5 mg total) by mouth at bedtime. 30 tablet 1  . divalproex (DEPAKOTE) 500 MG DR tablet Take 1 tablet (500 mg total) by mouth 3 (three) times daily. (Patient taking differently: Take 1,500 mg by mouth daily at 6 (six) AM. ) 90 tablet 1  . hydrOXYzine (ATARAX/VISTARIL) 50 MG tablet Take 1 tablet (50 mg total) by mouth 3 (three) times daily as needed for anxiety. 30 tablet 1  . oxybutynin (DITROPAN) 5 MG tablet Take 1 tablet (5 mg total) by mouth daily. 30 tablet 1  . prazosin (MINIPRESS) 2 MG capsule Take 2 capsules (4 mg total) by mouth at bedtime. 60 capsule 1  . risperiDONE (RISPERDAL) 4 MG tablet Take 1 tablet (4 mg total) by mouth at bedtime. 30 tablet 1  . valACYclovir (VALTREX) 1000 MG tablet Take 1 tablet (1,000 mg total) by mouth daily. 30 tablet 1  . venlafaxine XR (EFFEXOR-XR) 75 MG 24 hr capsule Take 3 capsules (225 mg total) by mouth daily. 90 capsule 1    Musculoskeletal: Strength & Muscle Tone: within normal limits Gait & Station: normal Patient leans: N/A  Psychiatric Specialty Exam: Physical Exam  Nursing note and vitals reviewed. Constitutional: She is oriented to person, place, and time. She appears well-developed.  Cardiovascular: Normal rate.  Respiratory: Effort normal.  Musculoskeletal:        General: Normal range of motion.     Cervical back: Normal range of motion.  Neurological: She is alert and oriented to person, place, and time.  Psychiatric: She has a normal mood and affect. Her behavior is normal.    Review of Systems  Psychiatric/Behavioral: The patient is nervous/anxious.   All other systems reviewed and are negative.   Blood pressure 109/70, pulse 84, temperature 98.9 F (37.2 C), temperature source  Oral, resp. rate 18, height 5\' 5"  (1.651 m), weight 82.6 kg, SpO2 97 %.Body mass index is 30.29 kg/m.  General Appearance: Casual and Disheveled  Eye Contact:  Fair  Speech:  Clear and Coherent  Volume:  Decreased  Mood:   Anxious and Depressed  Affect:  Depressed and Flat  Thought Process:  Coherent  Orientation:  Full (Time, Place, and Person)  Thought Content:  Logical  Suicidal Thoughts:  No  Homicidal Thoughts:  No  Memory:  Immediate;   Good Recent;   Good Remote;   Good  Judgement:  Fair  Insight:  Lacking  Psychomotor Activity:  Normal  Concentration:  Concentration: Fair and Attention Span: Fair  Recall:  Poor  Fund of Knowledge:  Fair  Language:  Good  Akathisia:  Negative  Handed:  Right  AIMS (if indicated):     Assets:  Communication Skills Desire for Improvement Resilience Social Support  ADL's:  Intact  Cognition:  WNL  Sleep:        Treatment Plan Summary: Medication management and Plan Patient does not meet criteria for psychiatric inpatient admission  Disposition: No evidence of imminent risk to self or others at present.   Patient does not meet criteria for psychiatric inpatient admission. Supportive therapy provided about ongoing stressors.  , NP 01/23/2020 9:55 PM

## 2020-01-23 NOTE — ED Notes (Signed)
Attempted to contact legal guardian listed Lollie Sails at [phone], unsuccessful

## 2020-01-23 NOTE — ED Notes (Signed)
See triage note, Pt reports she is in ED d/t getting irritated at group home and with her mother. Reports she began hitting/throwing stuff Denies HI/SI at this time.

## 2020-01-23 NOTE — ED Notes (Signed)
4 black hair ties, 1 silver hair tie, 1 brown hair tie, I brown slacks, I white long sleve  Shirt, 1 flowery tank shirt, 1 black tank top, 1 black flowery blouse, 1 purple underwear, 1 white bra, 1 pair leopard print booties.

## 2020-01-23 NOTE — ED Provider Notes (Signed)
[facility] Emergency Department Provider Note  Time seen: 7:32 PM  I have reviewed the triage vital signs and the nursing notes.   HISTORY  Chief Complaint Behavior Problem    HPI Kayleann Mccaffery is a 39 y.o. female with a past medical history of anxiety, depression, schizophrenia, MR, presents to the emergency department from her group home after she lost her temperature and became very aggressive and threatened suicide.  According to report and the patient she got agitated, states she lost her temper got very upset.  Patient states during her agitation she also threatened to kill her self which she now regrets per patient.  Denies any SI or HI feelings currently.  Denies any medical complaints tonight.  Specifically denies any chest pain fever cough or shortness of breath.   Past Medical History:  Diagnosis Date  . Anxiety   . Depression   . HSV infection   . Mental retardation   . Schizophrenia [facility])     Patient Active Problem List   Diagnosis Date Noted  . Schizophrenia (HCC) 10/02/2019  . Agitation   . IUD check up 09/06/2016  . Pelvic pain 09/06/2016  . Abnormal uterine bleeding (AUB) 09/06/2016  . Suicidal behavior 03/08/2016  . IUD contraception 03/02/2016  . Herpes genitalis 12/28/2015  . Borderline personality disorder (HCC) 09/16/2015  . Intellectual disability 09/16/2015  . Schizoaffective disorder, depressive type (HCC) 09/16/2015  . Mental retardation 05/29/2015    Past Surgical History:  Procedure Laterality Date  . CESAREAN SECTION      Prior to Admission medications   Medication Sig Start Date End Date Taking? Authorizing Provider  ARIPiprazole ER (ABILIFY MAINTENA) 400 MG PRSY prefilled syringe Inject 400 mg into the muscle every 21 ( twenty-one) days. Next dose due on 10/16/2019 10/16/19   Money, Gerlene [place], FNP  benztropine (COGENTIN) 0.5 MG tablet Take 1 tablet (0.5 mg total) by mouth at bedtime. 10/11/19   Money, Gerlene [place],  FNP  divalproex (DEPAKOTE) 500 MG DR tablet Take 1 tablet (500 mg total) by mouth 3 (three) times daily. 10/11/19   Money, Gerlene [place], FNP  hydrOXYzine (ATARAX/VISTARIL) 50 MG tablet Take 1 tablet (50 mg total) by mouth 3 (three) times daily as needed for anxiety. 10/11/19   Money, Gerlene [place], FNP  oxybutynin (DITROPAN) 5 MG tablet Take 1 tablet (5 mg total) by mouth daily. 10/11/19   Money, Gerlene [place], FNP  prazosin (MINIPRESS) 2 MG capsule Take 2 capsules (4 mg total) by mouth at bedtime. 10/11/19   Money, Gerlene [place], FNP  risperiDONE (RISPERDAL) 4 MG tablet Take 1 tablet (4 mg total) by mouth at bedtime. 10/11/19   Money, Gerlene [place], FNP  valACYclovir (VALTREX) 1000 MG tablet Take 1 tablet (1,000 mg total) by mouth daily. 10/11/19   Money, Gerlene [place], FNP  venlafaxine XR (EFFEXOR-XR) 75 MG 24 hr capsule Take 3 capsules (225 mg total) by mouth daily. 10/11/19   Money, Gerlene [place], FNP  Vitamin D, Ergocalciferol, (DRISDOL) 50000 units CAPS capsule Take 50,000 Units by mouth every Tuesday.     [provider]    No Known Allergies  Family History  Problem Relation Age of Onset  . Cancer Neg Hx     Social History Social History   Tobacco Use  . Smoking status: Never Smoker  . Smokeless tobacco: Never Used  Substance Use Topics  . Alcohol use: No  . Drug use: No    Review of Systems Constitutional: Negative for fever.  Cardiovascular: Negative for chest pain. Respiratory: Negative for shortness of breath. Gastrointestinal: Negative for abdominal pain Musculoskeletal: Negative for musculoskeletal complaints Neurological: Negative for headache All other ROS negative  ____________________________________________   PHYSICAL EXAM:  VITAL SIGNS: ED Triage Vitals  Enc Vitals Group     BP --      Pulse --      Resp --      Temp --      Temp src --      SpO2 --      Weight 01/23/20 1903 182 lb (82.6 kg)     Height 01/23/20 1903 5\' 5"  (1.651 m)     Head Circumference --      Peak Flow --       Pain Score 01/23/20 1902 9     Pain Loc --      Pain Edu? --      Excl. in [place]? --    Constitutional: Alert. Well appearing and in no distress. Eyes: Normal exam ENT      Head: Normocephalic and atraumatic.      Mouth/Throat: Mucous membranes are moist. Cardiovascular: Normal rate, regular rhythm.  Respiratory: Normal respiratory effort without tachypnea nor retractions. Breath sounds are clear Gastrointestinal: Soft and nontender. No distention.  Musculoskeletal: Nontender with normal range of motion in all extremities. No lower extremity tenderness or edema. Neurologic:  Normal speech and language. No gross focal neurologic deficits Psychiatric: Mood and affect are normal.   ____________________________________________   INITIAL IMPRESSION / ASSESSMENT AND PLAN / ED COURSE  Pertinent labs & imaging results that were available during my care of the patient were reviewed by me and considered in my medical decision making (see chart for details).   Patient presents to the emergency department for agitation and suicidal ideation.  We will maintain the IVC until psychiatry can evaluate.  Labs are thus far nonrevealing.  Labs are largely within normal limits.  Psychiatry is seen and will reevaluate in the morning for possible discharge home.  Sherry Blackard was evaluated in Emergency Department on 01/23/2020 for the symptoms described in the history of present illness. She was evaluated in the context of the global COVID-19 pandemic, which necessitated consideration that the patient might be at risk for infection with the SARS-CoV-2 virus that causes COVID-19. Institutional protocols and algorithms that pertain to the evaluation of patients at risk for COVID-19 are in a state of rapid change based on information released by regulatory bodies including the CDC and federal and state organizations. These policies and algorithms were followed during the patient's care in the  ED.  ____________________________________________   FINAL CLINICAL IMPRESSION(S) / ED DIAGNOSES  Suicidal ideation Agitation   Harvest Dark, MD 01/23/20 2239

## 2020-01-23 NOTE — ED Notes (Signed)
[facility] phone #[phone] (Supervisor in charge, Candie Echevaria)  Pt's legal guardian confirmed as being Krista [name]: phone #[phone] between the hrs of 8am-5pm. (After business hrs contact the Crisis Line at [phone])  Unable to notify the pt's legal guardian of the pt's arrival to the ED for treatment at this time after multiple attempts; consent to treat was obtained verbally via the Crisis Brewing technologist, Deliah Boston.

## 2020-01-23 NOTE — ED Triage Notes (Signed)
Pt brought in by bpd from a group home.   Reportedly by bpd, pt was throwing stuff in her room and took a piece of glass and threatened to kill herself.  Pt crying in triage.  Pt cooperative.

## 2020-01-23 NOTE — BH Assessment (Addendum)
Assessment Note  Krista [name] is an 39 y.o. female presenting to [facility] ED under IVC via police. Per triage note  Pt brought in by bpd from a group home.   Reportedly by bpd, pt was throwing stuff in her room and took a piece of glass and threatened to kill herself.  Pt crying in triage.  Pt cooperative. During assessment patient reported why she was presenting to the ED "I lost my temper and got upset with the saff because they were yelling at me." Patient reported having having SI earlier but reported "not right now." Patient denied HI/AH/VH. Patient reported that she is currently seeing a psychiatrist at [facility] "for my injection" and reported that she attends a "day program in [place]." Patient appeared to be alert and oriented x2-3, cooperative and does not appear to be responding to any internal or external stimuli.   Per Psyc NP patient will be observed overnight and reassessed in the morning. Clinician attempted to contact patient's legal guardian [name] [phone] regarding update on patient to be observed overnight, legal guardian did not answer, HIPAA voicemail was left.     Diagnosis: Borderline Personality Disorder, Schizoaffective Disorder   Past Medical History:  Past Medical History:  Diagnosis Date  . Anxiety   . Depression   . HSV infection   . Mental retardation   . Schizophrenia [facility])     Past Surgical History:  Procedure Laterality Date  . CESAREAN SECTION      Family History:  Family History  Problem Relation Age of Onset  . Cancer Neg Hx     Social History:  reports that she has never smoked. She has never used smokeless tobacco. She reports that she does not drink alcohol or use drugs.  Additional Social History:  Alcohol / Drug Use Pain Medications: See MAR Prescriptions: See MAR Over the Counter: See MAR History of alcohol / drug use?: No history of alcohol / drug abuse  CIWA: CIWA-Ar BP: 109/70 Pulse Rate: 84 COWS:     Allergies: No Known Allergies  Home Medications: (Not in a hospital admission)   OB/GYN Status:  No LMP recorded. (Menstrual status: IUD).  General Assessment Data Location of Assessment: [facility] ED TTS Assessment: In system Is this a Tele or Face-to-Face Assessment?: Face-to-Face Is this an Initial Assessment or a Re-assessment for this encounter?: Initial Assessment Patient Accompanied by:: N/A Language Other than English: No Living Arrangements: In Group Home: (Comment: Name of Group Home)([facility]) What gender do you identify as?: Female Marital status: Single Pregnancy Status: No Living Arrangements: Group Home Can pt return to current living arrangement?: Yes Admission Status: Involuntary Petitioner: Police Is patient capable of signing voluntary admission?: No Referral Source: Other Insurance type: Medicaid  Medical Screening Exam [facility] Walk-in ONLY) Medical Exam completed: Yes  Crisis Care Plan Living Arrangements: Group Home Legal Guardian: Other:(Chavis Gash) Name of Psychiatrist: [facility] Name of Therapist: None  Education Status Is patient currently in school?: No Is the patient employed, unemployed or receiving disability?: Receiving disability income  Risk to self with the past 6 months Suicidal Ideation: No-Not Currently/Within Last 6 Months Has patient been a risk to self within the past 6 months prior to admission? : Yes Suicidal Intent: No-Not Currently/Within Last 6 Months Has patient had any suicidal intent within the past 6 months prior to admission? : Yes Is patient at risk for suicide?: No Suicidal Plan?: No-Not Currently/Within Last 6 Months Has patient had any  suicidal plan within the past 6 months prior to admission? : No Access to Means: Yes Specify Access to Suicidal Means: Patient had access to glass What has been your use of drugs/alcohol within the last 12 months?: None Previous Attempts/Gestures: No Triggers for  Past Attempts: None known Intentional Self Injurious Behavior: None Family Suicide History: No Recent stressful life event(s): Other (Comment)(None known) Persecutory voices/beliefs?: No Depression: Yes Depression Symptoms: Isolating, Loss of interest in usual pleasures, Feeling worthless/self pity Substance abuse history and/or treatment for substance abuse?: No Suicide prevention information given to non-admitted patients: Not applicable  Risk to Others within the past 6 months Homicidal Ideation: No Does patient have any lifetime risk of violence toward others beyond the six months prior to admission? : No Thoughts of Harm to Others: No Current Homicidal Intent: No Current Homicidal Plan: No Access to Homicidal Means: No History of harm to others?: No Assessment of Violence: None Noted Does patient have access to weapons?: No Criminal Charges Pending?: No Does patient have a court date: No Is patient on probation?: No  Psychosis Hallucinations: None noted Delusions: None noted  Mental Status Report Appearance/Hygiene: In scrubs Eye Contact: Poor Motor Activity: Freedom of movement Speech: Logical/coherent Level of Consciousness: Alert Mood: Depressed Affect: Appropriate to circumstance Anxiety Level: Minimal Thought Processes: Coherent Judgement: Unimpaired Orientation: Person, Place, Time, Situation, Appropriate for developmental age Obsessive Compulsive Thoughts/Behaviors: None  Cognitive Functioning Concentration: Normal Memory: Recent Intact, Remote Intact Is patient IDD: No Insight: Fair Impulse Control: Poor Appetite: Good Have you had any weight changes? : No Change Sleep: No Change Total Hours of Sleep: 5 Vegetative Symptoms: None  ADLScreening [facility] Assessment Services) Patient's cognitive ability adequate to safely complete daily activities?: Yes Patient able to express need for assistance with ADLs?: Yes Independently performs ADLs?: Yes  (appropriate for developmental age)  Prior Inpatient Therapy Prior Inpatient Therapy: Yes Prior Therapy Dates: 10/01/2019 Prior Therapy Facilty/Provider(s): [facility] Reason for Treatment: SI  Prior Outpatient Therapy Prior Outpatient Therapy: Yes Prior Therapy Dates: Currently Prior Therapy Facilty/Provider(s): [facility] Reason for Treatment: Schizoaffective Disorder Does patient have an ACCT team?: No Does patient have Intensive In-House Services?  : No Does patient have [facility] services? : No Does patient have P4CC services?: No  ADL Screening (condition at time of admission) Patient's cognitive ability adequate to safely complete daily activities?: Yes Is the patient deaf or have difficulty hearing?: No Does the patient have difficulty seeing, even when wearing glasses/contacts?: No Does the patient have difficulty concentrating, remembering, or making decisions?: No Patient able to express need for assistance with ADLs?: Yes Does the patient have difficulty dressing or bathing?: No Independently performs ADLs?: Yes (appropriate for developmental age) Does the patient have difficulty walking or climbing stairs?: No Weakness of Legs: None Weakness of Arms/Hands: None  Home Assistive Devices/Equipment Home Assistive Devices/Equipment: None  Therapy Consults (therapy consults require a physician order) PT Evaluation Needed: No OT Evalulation Needed: No SLP Evaluation Needed: No Abuse/Neglect Assessment (Assessment to be complete while patient is alone) Abuse/Neglect Assessment Can Be Completed: Yes Physical Abuse: Denies Verbal Abuse: Denies Sexual Abuse: Denies Exploitation of patient/patient's resources: Denies Self-Neglect: Denies Values / Beliefs Cultural Requests During Hospitalization: None Spiritual Requests During Hospitalization: None Consults Spiritual Care Consult Needed: No Transition of Care Team Consult Needed: No Advance Directives (For  Healthcare) Does Patient Have a Medical Advance Directive?: No          Disposition: Per Psyc NP patient will be observed overnight  and reassessed in the morning. Clinician attempted to contact patient's legal guardian Solmon Ice [phone] regarding update on patient to be observed overnight, legal guardian did not answer, HIPAA voicemail was left.   Disposition Initial Assessment Completed for this Encounter: Yes  On Site Evaluation by:   Reviewed with Physician:    Leonie [name] MS LCASA 01/23/2020 10:34 PM

## 2020-01-23 NOTE — ED Notes (Signed)
Psychiatry at bedside.

## 2020-01-23 NOTE — BH Assessment (Signed)
TTS attempted to contact patient's legal guardian [name] [phone] regarding update on patient to be observed overnight, legal guardian did not answer, HIPAA voicemail was left. TTS contacted after hours crisis line [phone] Deliah Boston to update patient's status and to inform that voicemail was left with legal guardian.   TTS contacted [facility] [phone] regarding patient to observed overnight, spoke with Candie Echevaria (group home supervisor) who was receptive.

## 2020-01-24 NOTE — ED Notes (Signed)
Assumed care of patient patient sleeping comfortable. As per prior nurse patient had a few crying episodes and fell asleep. Will re-assess upon awakening.

## 2020-01-24 NOTE — ED Provider Notes (Signed)
-----------------------------------------   8:13 AM on 01/24/2020 -----------------------------------------   Blood pressure 109/70, pulse 84, temperature 98.9 F (37.2 C), temperature source Oral, resp. rate 18, height 5\' 5"  (1.651 m), weight 82.6 kg, SpO2 97 %.  The patient is calm and cooperative at this time.  There have been no acute events since the last update.  Awaiting disposition plan from Behavioral Medicine and/or Social Work team(s).    , MD 01/24/20 [phone]

## 2020-01-24 NOTE — ED Notes (Signed)
Patient up out of bed to bathroom. Breakfast provided.

## 2020-01-24 NOTE — Discharge Instructions (Signed)

## 2020-01-24 NOTE — ED Notes (Signed)
Legal guardian listed on chart called multiple times, as per message on voice mail instructed to call the crisis center. Crisis Center called @ [phone], reports will contact patient legal Guardian and advise him to call [facility].

## 2020-01-24 NOTE — ED Notes (Signed)
Legal guardian called phone goes right to voice mail.  voice mail left to call emergency department.

## 2020-01-24 NOTE — BH Assessment (Signed)
Writer spoke with patient to complete updated/reassessment. Patient denies SI/HI and AV/H.  Psychiatrist spoke with Group Home (Dr. Brien Few) spoke with Group Home and they going to pick patient up, upon discharge.

## 2020-01-24 NOTE — ED Notes (Signed)
Court appointed legal [name] called back is aware that patient is being discharged back to her group home. [name] called at group home aware of discharge will be here within 1 hour to pick her up.

## 2020-06-03 ENCOUNTER — Encounter: Payer: Self-pay | Admitting: Obstetrics and Gynecology

## 2020-06-03 ENCOUNTER — Other Ambulatory Visit: Payer: Self-pay

## 2020-06-03 ENCOUNTER — Ambulatory Visit (INDEPENDENT_AMBULATORY_CARE_PROVIDER_SITE_OTHER): Payer: Medicaid Other | Admitting: Obstetrics and Gynecology

## 2020-06-03 VITALS — BP 113/76 | HR 81 | Ht 66.0 in | Wt 174.6 lb

## 2020-06-03 DIAGNOSIS — N921 Excessive and frequent menstruation with irregular cycle: Secondary | ICD-10-CM

## 2020-06-03 DIAGNOSIS — L731 Pseudofolliculitis barbae: Secondary | ICD-10-CM

## 2020-06-03 DIAGNOSIS — Z975 Presence of (intrauterine) contraceptive device: Secondary | ICD-10-CM

## 2020-06-03 NOTE — Progress Notes (Signed)
HPI:      [name] is a 39 y.o. G1P1001 who LMP was No LMP recorded. (Menstrual status: IUD).  Subjective:   She presents today stating that over the last week she has noticed a "bump" in her mons area.  She thinks it may be from "shaving".  It is moderately tender to touch she says. She also continues to have occasional irregular bleeding with IUD.  Investigation of her chart shows that she has a [place] IUD.  It was placed in 2017.    Hx: The following portions of the patient's history were reviewed and updated as appropriate:             She  has a past medical history of Anxiety, Depression, HSV infection, Mental retardation, and Schizophrenia (HCC). She does not have any pertinent problems on file. She  has a past surgical history that includes Cesarean section. Her family history is not on file. She  reports that she has never smoked. She has never used smokeless tobacco. She reports that she does not drink alcohol and does not use drugs. She has a current medication list which includes the following prescription(s): aripiprazole er, benztropine, divalproex, hydroxyzine, oxybutynin, prazosin, risperidone, valacyclovir, and venlafaxine xr. She has No Known Allergies.       Review of Systems:  Review of Systems  Constitutional: Denied constitutional symptoms, night sweats, recent illness, fatigue, fever, insomnia and weight loss.  Eyes: Denied eye symptoms, eye pain, photophobia, vision change and visual disturbance.  Ears/Nose/Throat/Neck: Denied ear, nose, throat or neck symptoms, hearing loss, nasal discharge, sinus congestion and sore throat.  Cardiovascular: Denied cardiovascular symptoms, arrhythmia, chest pain/pressure, edema, exercise intolerance, orthopnea and palpitations.  Respiratory: Denied pulmonary symptoms, asthma, pleuritic pain, productive sputum, cough, dyspnea and wheezing.  Gastrointestinal: Denied, gastro-esophageal reflux, melena, nausea and vomiting.   Genitourinary: See HPI for additional information.  Musculoskeletal: Denied musculoskeletal symptoms, stiffness, swelling, muscle weakness and myalgia.  Dermatologic: Denied dermatology symptoms, rash and scar.  Neurologic: Denied neurology symptoms, dizziness, headache, neck pain and syncope.  Psychiatric: Denied psychiatric symptoms, anxiety and depression.  Endocrine: Denied endocrine symptoms including hot flashes and night sweats.   Meds:   Current Outpatient Medications on File Prior to Visit  Medication Sig Dispense Refill  . ARIPiprazole ER (ABILIFY MAINTENA) 400 MG PRSY prefilled syringe Inject 400 mg into the muscle every 21 ( twenty-one) days. Next dose due on 10/16/2019 1 each 1  . benztropine (COGENTIN) 0.5 MG tablet Take 1 tablet (0.5 mg total) by mouth at bedtime. 30 tablet 1  . divalproex (DEPAKOTE) 500 MG DR tablet Take 1 tablet (500 mg total) by mouth 3 (three) times daily. (Patient taking differently: Take 1,500 mg by mouth daily at 6 (six) AM. ) 90 tablet 1  . hydrOXYzine (ATARAX/VISTARIL) 50 MG tablet Take 1 tablet (50 mg total) by mouth 3 (three) times daily as needed for anxiety. 30 tablet 1  . oxybutynin (DITROPAN) 5 MG tablet Take 1 tablet (5 mg total) by mouth daily. 30 tablet 1  . prazosin (MINIPRESS) 2 MG capsule Take 2 capsules (4 mg total) by mouth at bedtime. 60 capsule 1  . risperiDONE (RISPERDAL) 4 MG tablet Take 1 tablet (4 mg total) by mouth at bedtime. 30 tablet 1  . valACYclovir (VALTREX) 1000 MG tablet Take 1 tablet (1,000 mg total) by mouth daily. 30 tablet 1  . venlafaxine XR (EFFEXOR-XR) 75 MG 24 hr capsule Take 3 capsules (225 mg total) by mouth daily. 90  capsule 1   No current facility-administered medications on file prior to visit.    Objective:     Vitals:   06/03/20 1050  BP: 113/76  Pulse: 81              Physical examination   Pelvic:   Vulva: Normal appearance.  No lesions.  Small bump noted in mons area.  Possible ingrown hair.   Slightly tender to palpation.  Vagina: No lesions or abnormalities noted.  Support: Normal pelvic support.  Urethra No masses tenderness or scarring.  Meatus Normal size without lesions or prolapse.  Cervix: Normal appearance.  No lesions. IUD strings noted at cervical os.  Anus: Normal exam.  No lesions.  Perineum: Normal exam.  No lesions.        Bimanual   Uterus: Normal size.  Non-tender.  Mobile.  AV.  Adnexae: No masses.  Non-tender to palpation.  Cul-de-sac: Negative for abnormality.     Assessment:    G1P1001 Patient Active Problem List   Diagnosis Date Noted  . Schizophrenia (HCC) 10/02/2019  . Agitation   . IUD check up 09/06/2016  . Pelvic pain 09/06/2016  . Abnormal uterine bleeding (AUB) 09/06/2016  . Suicidal behavior 03/08/2016  . IUD contraception 03/02/2016  . Herpes genitalis 12/28/2015  . Borderline personality disorder (HCC) 09/16/2015  . Intellectual disability 09/16/2015  . Schizoaffective disorder, depressive type (HCC) 09/16/2015  . Mental retardation 05/29/2015     1. Ingrown hair   2. Breakthrough bleeding associated with intrauterine device (IUD)     Ingrown hair follicle likely secondary to shaving.  IUD seems correctly position based on strings.  Patient "okay" with occasional irregular bleeding.   Plan:            1.  Nothing further to do about IUD.  IUD does not expire for 2 more years.  2.  Discussed ingrown hair and shaving.  Warm compresses twice daily.  Expect rapid resolution. Orders No orders of the defined types were placed in this encounter.   No orders of the defined types were placed in this encounter.     F/U  Return for Annual Physical. I spent 21 minutes involved in the care of this patient preparing to see the patient by obtaining and reviewing her medical history (including labs, imaging tests and prior procedures), documenting clinical information in the electronic health record (EHR), counseling and coordinating  care plans, writing and sending prescriptions, ordering tests or procedures and directly communicating with the patient by discussing pertinent items from her history and physical exam as well as detailing my assessment and plan as noted above so that she has an informed understanding.  All of her questions were answered.  Elonda Husky, M.D. 06/03/2020 11:30 AM

## 2020-06-18 ENCOUNTER — Encounter: Payer: Medicaid Other | Admitting: Obstetrics and Gynecology

## 2020-06-22 ENCOUNTER — Telehealth: Payer: Self-pay | Admitting: Obstetrics and Gynecology

## 2020-06-22 NOTE — Telephone Encounter (Signed)
Patients PCP called in wanting to speak to Dr. Logan Bores regarding a medication he had prescribed to this patient. You can reach Dr. Dario Guardian at [phone].

## 2020-06-23 ENCOUNTER — Encounter: Payer: Self-pay | Admitting: Obstetrics and Gynecology

## 2020-06-23 ENCOUNTER — Ambulatory Visit (INDEPENDENT_AMBULATORY_CARE_PROVIDER_SITE_OTHER): Payer: Medicaid Other | Admitting: Obstetrics and Gynecology

## 2020-06-23 ENCOUNTER — Other Ambulatory Visit: Payer: Self-pay

## 2020-06-23 VITALS — BP 128/84 | HR 96 | Ht 66.0 in | Wt 176.0 lb

## 2020-06-23 DIAGNOSIS — Z01419 Encounter for gynecological examination (general) (routine) without abnormal findings: Secondary | ICD-10-CM

## 2020-06-23 DIAGNOSIS — N644 Mastodynia: Secondary | ICD-10-CM | POA: Diagnosis not present

## 2020-06-23 DIAGNOSIS — Z0000 Encounter for general adult medical examination without abnormal findings: Secondary | ICD-10-CM | POA: Diagnosis not present

## 2020-06-23 NOTE — Progress Notes (Signed)
HPI:      Krista [name] is a 39 y.o. G1P1001 who LMP was No LMP recorded. (Menstrual status: IUD).  Subjective:   She presents today for her annual examination.  She still is noticing the small bump in her mons area. Today she is complaining of significant breast tenderness over the last week.  She states that she often gets breast tenderness with her menses but this seems worse than usual. She has an IUD for birth control Liletta placed in 2017 -expires in 2023.  She states that she has irregular bleeding with Liletta but is generally happy with it.    Hx: The following portions of the patient's history were reviewed and updated as appropriate:             She  has a past medical history of Anxiety, Depression, HSV infection, Mental retardation, and Schizophrenia (HCC). She does not have any pertinent problems on file. She  has a past surgical history that includes Cesarean section. Her family history is not on file. She  reports that she has never smoked. She has never used smokeless tobacco. She reports that she does not drink alcohol and does not use drugs. She has a current medication list which includes the following prescription(s): aripiprazole er, benztropine, divalproex, hydroxyzine, oxybutynin, prazosin, risperidone, valacyclovir, and venlafaxine xr. She has No Known Allergies.       Review of Systems:  Review of Systems  Constitutional: Denied constitutional symptoms, night sweats, recent illness, fatigue, fever, insomnia and weight loss.  Eyes: Denied eye symptoms, eye pain, photophobia, vision change and visual disturbance.  Ears/Nose/Throat/Neck: Denied ear, nose, throat or neck symptoms, hearing loss, nasal discharge, sinus congestion and sore throat.  Cardiovascular: Denied cardiovascular symptoms, arrhythmia, chest pain/pressure, edema, exercise intolerance, orthopnea and palpitations.  Respiratory: Denied pulmonary symptoms, asthma, pleuritic pain, productive  sputum, cough, dyspnea and wheezing.  Gastrointestinal: Denied, gastro-esophageal reflux, melena, nausea and vomiting.  Genitourinary: See HPI for additional information.  Musculoskeletal: Denied musculoskeletal symptoms, stiffness, swelling, muscle weakness and myalgia.  Dermatologic: Denied dermatology symptoms, rash and scar.  Neurologic: Denied neurology symptoms, dizziness, headache, neck pain and syncope.  Psychiatric: Denied psychiatric symptoms, anxiety and depression.  Endocrine: Denied endocrine symptoms including hot flashes and night sweats.   Meds:   Current Outpatient Medications on File Prior to Visit  Medication Sig Dispense Refill  . ARIPiprazole ER (ABILIFY MAINTENA) 400 MG PRSY prefilled syringe Inject 400 mg into the muscle every 21 ( twenty-one) days. Next dose due on 10/16/2019 1 each 1  . benztropine (COGENTIN) 0.5 MG tablet Take 1 tablet (0.5 mg total) by mouth at bedtime. 30 tablet 1  . divalproex (DEPAKOTE) 500 MG DR tablet Take 1 tablet (500 mg total) by mouth 3 (three) times daily. (Patient taking differently: Take 1,500 mg by mouth daily at 6 (six) AM. ) 90 tablet 1  . hydrOXYzine (ATARAX/VISTARIL) 50 MG tablet Take 1 tablet (50 mg total) by mouth 3 (three) times daily as needed for anxiety. 30 tablet 1  . oxybutynin (DITROPAN) 5 MG tablet Take 1 tablet (5 mg total) by mouth daily. 30 tablet 1  . prazosin (MINIPRESS) 2 MG capsule Take 2 capsules (4 mg total) by mouth at bedtime. 60 capsule 1  . risperiDONE (RISPERDAL) 4 MG tablet Take 1 tablet (4 mg total) by mouth at bedtime. 30 tablet 1  . valACYclovir (VALTREX) 1000 MG tablet Take 1 tablet (1,000 mg total) by mouth daily. 30 tablet 1  . venlafaxine XR (  EFFEXOR-XR) 75 MG 24 hr capsule Take 3 capsules (225 mg total) by mouth daily. 90 capsule 1   No current facility-administered medications on file prior to visit.    Objective:     Vitals:   06/23/20 1442  BP: 128/84  Pulse: 96              Physical  examination General NAD, Conversant  HEENT Atraumatic; Op clear with mmm.  Normo-cephalic. Pupils reactive. Anicteric sclerae  Thyroid/Neck Smooth without nodularity or enlargement. Normal ROM.  Neck Supple.  Skin No rashes, lesions or ulceration. Normal palpated skin turgor. No nodularity.  Breasts: No masses or discharge.  Symmetric.  No axillary adenopathy.  Very tender to palpation but no masses are noted  Lungs: Clear to auscultation.No rales or wheezes. Normal Respiratory effort, no retractions.  Heart: NSR.  No murmurs or rubs appreciated. No periferal edema  Abdomen: Soft.  Non-tender.  No masses.  No HSM. No hernia  Extremities: Moves all appropriately.  Normal ROM for age. No lymphadenopathy.  Neuro: Oriented to PPT.  Normal mood. Normal affect.     Pelvic:   Vulva: Normal appearance.  No lesions.  Very small bump noted in mons mildly tender to palpation.  Vagina: No lesions or abnormalities noted.  Support: Normal pelvic support.  Urethra No masses tenderness or scarring.  Meatus Normal size without lesions or prolapse.  Cervix: Normal appearance.  No lesions.  IUD strings noted at the cervical os  Anus: Normal exam.  No lesions.  Perineum: Normal exam.  No lesions.        Bimanual   Uterus: Normal size.  Non-tender.  Mobile.  AV.  Adnexae: No masses.  Non-tender to palpation.  Cul-de-sac: Negative for abnormality.      Assessment:    G1P1001 Patient Active Problem List   Diagnosis Date Noted  . Schizophrenia (HCC) 10/02/2019  . Agitation   . IUD check up 09/06/2016  . Pelvic pain 09/06/2016  . Abnormal uterine bleeding (AUB) 09/06/2016  . Suicidal behavior 03/08/2016  . IUD contraception 03/02/2016  . Herpes genitalis 12/28/2015  . Borderline personality disorder (HCC) 09/16/2015  . Intellectual disability 09/16/2015  . Schizoaffective disorder, depressive type (HCC) 09/16/2015  . Mental retardation 05/29/2015     1. Well woman exam with routine  gynecological exam   2. Breast tenderness        Plan:            1.  Basic Screening Recommendations The basic screening recommendations for asymptomatic women were discussed with the patient during her visit.  The age-appropriate recommendations were discussed with her and the rational for the tests reviewed.  When I am informed by the patient that another primary care physician has previously obtained the age-appropriate tests and they are up-to-date, only outstanding tests are ordered and referrals given as necessary.  Abnormal results of tests will be discussed with her when all of her results are completed.  Routine preventative health maintenance measures emphasized: Exercise/Diet/Weight control, Tobacco Warnings, Alcohol/Substance use risks and Stress Management Mammogram next year 2.  IUD change in 2023 3.  Fasting morning prolactin  Orders Orders Placed This Encounter  Procedures  . Prolactin    No orders of the defined types were placed in this encounter.       F/U  Return in about 1 year (around 06/23/2021) for Annual Physical.  Elonda Husky, M.D. 06/23/2020 3:17 PM

## 2020-06-23 NOTE — Telephone Encounter (Signed)
Patients doctors office called and wanted to know if patient had an outbreak of herpes at last visit. I read Dr. Logan Bores note and nothing was said about an outbreak. Patient is coming in today and I let Dr. Dario Guardian office know that I would let Dr. Logan Bores know to check for outbreak.

## 2020-06-24 ENCOUNTER — Telehealth: Payer: Self-pay | Admitting: Obstetrics and Gynecology

## 2020-06-24 NOTE — Telephone Encounter (Signed)
Called the pts legal guardian [name], [name]) the pt was seen yesterday. When the pt was checking out [name] requested that someone call him in the morning to make [name] next two appointments. I called [name] and we make the pts next two appointments. [name] thanked me for calling him, I told him we would see them at there next appointment.gal Guardian

## 2020-06-26 ENCOUNTER — Telehealth: Payer: Self-pay | Admitting: Obstetrics and Gynecology

## 2020-06-26 NOTE — Telephone Encounter (Signed)
[facility] called and  Dr. Ander Slade was requesting this information from this pts last visit.  To please give the office a call. [phone] Krista [name]

## 2020-06-29 ENCOUNTER — Other Ambulatory Visit: Payer: Medicaid Other

## 2020-06-29 NOTE — Telephone Encounter (Signed)
LM for doctors office to return call.

## 2020-07-03 NOTE — Telephone Encounter (Signed)
LM at doctors office.

## 2020-07-15 ENCOUNTER — Telehealth: Payer: Self-pay | Admitting: Obstetrics and Gynecology

## 2020-07-15 NOTE — Telephone Encounter (Signed)
[phone] 

## 2020-07-15 NOTE — Telephone Encounter (Signed)
Crystal from [facility] called in asking if this patient has had a herpes outbreak at her last visit with [facility] as the  patient is requesting a refill on the medication for it. Dr. Dario Guardian doesn't feel comfortable refilling it if she hasn't had an outbreak recently per Crystal. Could you please advise?

## 2020-07-15 NOTE — Telephone Encounter (Signed)
Can you please get the number that I need to call.

## 2020-08-25 ENCOUNTER — Encounter: Payer: Medicaid Other | Admitting: Obstetrics and Gynecology

## 2020-09-08 ENCOUNTER — Ambulatory Visit (INDEPENDENT_AMBULATORY_CARE_PROVIDER_SITE_OTHER): Payer: Medicaid Other | Admitting: Obstetrics and Gynecology

## 2020-09-08 ENCOUNTER — Other Ambulatory Visit: Payer: Self-pay

## 2020-09-08 ENCOUNTER — Encounter: Payer: Self-pay | Admitting: Obstetrics and Gynecology

## 2020-09-08 VITALS — BP 122/81 | HR 81 | Wt 183.7 lb

## 2020-09-08 DIAGNOSIS — Z30431 Encounter for routine checking of intrauterine contraceptive device: Secondary | ICD-10-CM | POA: Diagnosis not present

## 2020-09-08 DIAGNOSIS — B009 Herpesviral infection, unspecified: Secondary | ICD-10-CM

## 2020-09-08 NOTE — Progress Notes (Signed)
HPI:      [name] is a 39 y.o. G1P1001 who LMP was No LMP recorded. (Menstrual status: IUD).  Subjective:   She presents today with her caregiver from the group home.  She has some questions regarding HSV and her IUD.  She reports that she has no further outbreaks of HSV and takes her Valtrex every day.  She reports the "bump" that was present at her last visit is now gone.  She reports that she is no longer having any bleeding with her IUD.    Hx: The following portions of the patient's history were reviewed and updated as appropriate:             She  has a past medical history of Anxiety, Depression, HSV infection, Mental retardation, and Schizophrenia (HCC). She does not have any pertinent problems on file. She  has a past surgical history that includes Cesarean section. Her family history is not on file. She  reports that she has never smoked. She has never used smokeless tobacco. She reports that she does not drink alcohol and does not use drugs. She has a current medication list which includes the following prescription(s): aripiprazole er, benztropine, divalproex, hydroxyzine, oxybutynin, prazosin, risperidone, valacyclovir, and venlafaxine xr. She has No Known Allergies.       Review of Systems:  Review of Systems  Constitutional: Denied constitutional symptoms, night sweats, recent illness, fatigue, fever, insomnia and weight loss.  Eyes: Denied eye symptoms, eye pain, photophobia, vision change and visual disturbance.  Ears/Nose/Throat/Neck: Denied ear, nose, throat or neck symptoms, hearing loss, nasal discharge, sinus congestion and sore throat.  Cardiovascular: Denied cardiovascular symptoms, arrhythmia, chest pain/pressure, edema, exercise intolerance, orthopnea and palpitations.  Respiratory: Denied pulmonary symptoms, asthma, pleuritic pain, productive sputum, cough, dyspnea and wheezing.  Gastrointestinal: Denied, gastro-esophageal reflux, melena, nausea and  vomiting.  Genitourinary: Denied genitourinary symptoms including symptomatic vaginal discharge, pelvic relaxation issues, and urinary complaints.  Musculoskeletal: Denied musculoskeletal symptoms, stiffness, swelling, muscle weakness and myalgia.  Dermatologic: Denied dermatology symptoms, rash and scar.  Neurologic: Denied neurology symptoms, dizziness, headache, neck pain and syncope.  Psychiatric: Denied psychiatric symptoms, anxiety and depression.  Endocrine: Denied endocrine symptoms including hot flashes and night sweats.   Meds:   Current Outpatient Medications on File Prior to Visit  Medication Sig Dispense Refill  . ARIPiprazole ER (ABILIFY MAINTENA) 400 MG PRSY prefilled syringe Inject 400 mg into the muscle every 21 ( twenty-one) days. Next dose due on 10/16/2019 1 each 1  . benztropine (COGENTIN) 0.5 MG tablet Take 1 tablet (0.5 mg total) by mouth at bedtime. 30 tablet 1  . divalproex (DEPAKOTE) 500 MG DR tablet Take 1 tablet (500 mg total) by mouth 3 (three) times daily. (Patient taking differently: Take 1,500 mg by mouth daily at 6 (six) AM. ) 90 tablet 1  . hydrOXYzine (ATARAX/VISTARIL) 50 MG tablet Take 1 tablet (50 mg total) by mouth 3 (three) times daily as needed for anxiety. 30 tablet 1  . oxybutynin (DITROPAN) 5 MG tablet Take 1 tablet (5 mg total) by mouth daily. 30 tablet 1  . prazosin (MINIPRESS) 2 MG capsule Take 2 capsules (4 mg total) by mouth at bedtime. 60 capsule 1  . risperiDONE (RISPERDAL) 4 MG tablet Take 1 tablet (4 mg total) by mouth at bedtime. 30 tablet 1  . valACYclovir (VALTREX) 1000 MG tablet Take 1 tablet (1,000 mg total) by mouth daily. 30 tablet 1  . venlafaxine XR (EFFEXOR-XR) 75 MG 24 hr  capsule Take 3 capsules (225 mg total) by mouth daily. 90 capsule 1   No current facility-administered medications on file prior to visit.    Objective:     Vitals:   09/08/20 0931  BP: 122/81  Pulse: 81   Filed Weights   09/08/20 0931  Weight: 183 lb  11.2 oz (83.3 kg)              Physical examination   Pelvic:   Vulva: Normal appearance.  No lesions.  Vagina: No lesions or abnormalities noted.  Support: Normal pelvic support.  Urethra No masses tenderness or scarring.  Meatus Normal size without lesions or prolapse.  Cervix: Normal appearance.  No lesions.  IUD strings noted at cervical os  Anus: Normal exam.  No lesions.  Perineum: Normal exam.  No lesions.        Bimanual   Uterus: Normal size.  Non-tender.  Mobile.  AV.  Adnexae: No masses.  Non-tender to palpation.  Cul-de-sac: Negative for abnormality.     Assessment:    G1P1001 Patient Active Problem List   Diagnosis Date Noted  . Schizophrenia (HCC) 10/02/2019  . Agitation   . IUD check up 09/06/2016  . Pelvic pain 09/06/2016  . Abnormal uterine bleeding (AUB) 09/06/2016  . Suicidal behavior 03/08/2016  . IUD contraception 03/02/2016  . Herpes genitalis 12/28/2015  . Borderline personality disorder (HCC) 09/16/2015  . Intellectual disability 09/16/2015  . Schizoaffective disorder, depressive type (HCC) 09/16/2015  . Mental retardation 05/29/2015     1. HSV infection   2. Surveillance of previously prescribed intrauterine contraceptive device     No evidence of HSV infection and taking daily Valtrex  No issues with her IUD.   Plan:            1.  I have reassured her regarding HSV.  I see no lesions or issues.  Patient is taking daily Valtrex.  2.  I have also reassured her regarding her IUD.  Skipping menstrual periods is normal with some IUDs and we have discussed this.  All of her questions were answered. Orders No orders of the defined types were placed in this encounter.   No orders of the defined types were placed in this encounter.     F/U  Return for Annual Physical. I spent 21 minutes involved in the care of this patient preparing to see the patient by obtaining and reviewing her medical history (including labs, imaging tests and prior  procedures), documenting clinical information in the electronic health record (EHR), counseling and coordinating care plans, writing and sending prescriptions, ordering tests or procedures and directly communicating with the patient by discussing pertinent items from her history and physical exam as well as detailing my assessment and plan as noted above so that she has an informed understanding.  All of her questions were answered.  Elonda Husky, M.D. 09/08/2020 10:11 AM

## 2021-03-03 ENCOUNTER — Encounter: Payer: Medicaid Other | Admitting: Obstetrics and Gynecology

## 2021-06-29 ENCOUNTER — Encounter: Payer: Self-pay | Admitting: Obstetrics and Gynecology

## 2021-06-29 ENCOUNTER — Encounter: Payer: Medicaid Other | Admitting: Obstetrics and Gynecology
